# Patient Record
Sex: Male | Born: 1945 | Race: White | Hispanic: No | Marital: Married | Smoking: Former smoker
Health system: Southern US, Community
[De-identification: ages and names within clinical notes are randomized; demographics above are authoritative.]

## PROBLEM LIST (undated history)

## (undated) DIAGNOSIS — J449 Chronic obstructive pulmonary disease, unspecified: Secondary | ICD-10-CM

## (undated) DIAGNOSIS — I7779 Dissection of other artery: Secondary | ICD-10-CM

## (undated) DIAGNOSIS — E119 Type 2 diabetes mellitus without complications: Secondary | ICD-10-CM

## (undated) DIAGNOSIS — H409 Unspecified glaucoma: Secondary | ICD-10-CM

## (undated) DIAGNOSIS — G4700 Insomnia, unspecified: Secondary | ICD-10-CM

## (undated) DIAGNOSIS — M1990 Unspecified osteoarthritis, unspecified site: Secondary | ICD-10-CM

## (undated) DIAGNOSIS — K5790 Diverticulosis of intestine, part unspecified, without perforation or abscess without bleeding: Secondary | ICD-10-CM

## (undated) DIAGNOSIS — R42 Dizziness and giddiness: Secondary | ICD-10-CM

## (undated) DIAGNOSIS — I2510 Atherosclerotic heart disease of native coronary artery without angina pectoris: Secondary | ICD-10-CM

## (undated) DIAGNOSIS — C801 Malignant (primary) neoplasm, unspecified: Secondary | ICD-10-CM

## (undated) DIAGNOSIS — T8859XA Other complications of anesthesia, initial encounter: Secondary | ICD-10-CM

## (undated) DIAGNOSIS — H9190 Unspecified hearing loss, unspecified ear: Secondary | ICD-10-CM

## (undated) DIAGNOSIS — I10 Essential (primary) hypertension: Secondary | ICD-10-CM

## (undated) DIAGNOSIS — H8109 Meniere's disease, unspecified ear: Secondary | ICD-10-CM

## (undated) DIAGNOSIS — D696 Thrombocytopenia, unspecified: Secondary | ICD-10-CM

## (undated) DIAGNOSIS — D126 Benign neoplasm of colon, unspecified: Secondary | ICD-10-CM

## (undated) DIAGNOSIS — E7800 Pure hypercholesterolemia, unspecified: Secondary | ICD-10-CM

## (undated) DIAGNOSIS — Z974 Presence of external hearing-aid: Secondary | ICD-10-CM

## (undated) HISTORY — PX: CORONARY ANGIOPLASTY WITH STENT PLACEMENT: SHX49

## (undated) HISTORY — PX: COLONOSCOPY: SHX174

## (undated) HISTORY — PX: APPENDECTOMY: SHX54

## (undated) HISTORY — PX: ABDOMINAL SURGERY: SHX537

---

## 2004-11-03 ENCOUNTER — Ambulatory Visit: Payer: Self-pay | Admitting: Gastroenterology

## 2005-03-14 ENCOUNTER — Ambulatory Visit: Payer: Self-pay

## 2006-03-13 ENCOUNTER — Ambulatory Visit: Payer: Self-pay

## 2006-12-20 ENCOUNTER — Ambulatory Visit: Payer: Self-pay | Admitting: Gastroenterology

## 2007-01-23 ENCOUNTER — Ambulatory Visit: Payer: Self-pay | Admitting: Internal Medicine

## 2007-06-10 ENCOUNTER — Inpatient Hospital Stay: Payer: Self-pay | Admitting: Internal Medicine

## 2007-06-10 ENCOUNTER — Other Ambulatory Visit: Payer: Self-pay

## 2007-06-11 ENCOUNTER — Other Ambulatory Visit: Payer: Self-pay

## 2007-11-29 ENCOUNTER — Ambulatory Visit: Payer: Self-pay | Admitting: Gastroenterology

## 2009-08-02 ENCOUNTER — Ambulatory Visit: Payer: Self-pay | Admitting: Urology

## 2010-07-28 ENCOUNTER — Ambulatory Visit: Payer: Self-pay

## 2011-06-05 ENCOUNTER — Ambulatory Visit: Payer: Self-pay | Admitting: Internal Medicine

## 2011-06-19 ENCOUNTER — Ambulatory Visit: Payer: Self-pay | Admitting: Internal Medicine

## 2011-06-23 ENCOUNTER — Ambulatory Visit: Payer: Self-pay | Admitting: Internal Medicine

## 2011-07-24 ENCOUNTER — Ambulatory Visit: Payer: Self-pay | Admitting: Internal Medicine

## 2011-07-31 ENCOUNTER — Ambulatory Visit: Payer: Self-pay | Admitting: Internal Medicine

## 2011-08-23 ENCOUNTER — Ambulatory Visit: Payer: Self-pay | Admitting: Internal Medicine

## 2011-10-09 ENCOUNTER — Ambulatory Visit: Payer: Self-pay | Admitting: Internal Medicine

## 2011-10-14 DIAGNOSIS — I2510 Atherosclerotic heart disease of native coronary artery without angina pectoris: Secondary | ICD-10-CM | POA: Insufficient documentation

## 2013-03-13 ENCOUNTER — Ambulatory Visit: Payer: Self-pay | Admitting: Gastroenterology

## 2013-07-24 ENCOUNTER — Inpatient Hospital Stay: Payer: Self-pay | Admitting: Internal Medicine

## 2013-07-24 LAB — BASIC METABOLIC PANEL
Anion Gap: 5 — ABNORMAL LOW (ref 7–16)
BUN: 26 mg/dL — AB (ref 7–18)
CALCIUM: 8.8 mg/dL (ref 8.5–10.1)
Chloride: 98 mmol/L (ref 98–107)
Co2: 27 mmol/L (ref 21–32)
Creatinine: 0.98 mg/dL (ref 0.60–1.30)
EGFR (African American): >60
Glucose: 125 mg/dL — ABNORMAL HIGH (ref 65–99)
Osmolality: 267 (ref 275–301)
Potassium: 4.2 mmol/L (ref 3.5–5.1)
SODIUM: 130 mmol/L — AB (ref 136–145)

## 2013-07-24 LAB — CBC
HCT: 45.3 % (ref 40.0–52.0)
HGB: 15.7 g/dL (ref 13.0–18.0)
MCH: 32.8 pg (ref 26.0–34.0)
MCHC: 34.6 g/dL (ref 32.0–36.0)
MCV: 95 fL (ref 80–100)
Platelet: 150 10*3/uL (ref 150–440)
RBC: 4.78 10*6/uL (ref 4.40–5.90)
RDW: 13.1 % (ref 11.5–14.5)
WBC: 10 10*3/uL (ref 3.8–10.6)

## 2013-07-24 LAB — LIPASE, BLOOD: LIPASE: 191 U/L (ref 73–393)

## 2013-07-24 LAB — TROPONIN I: Troponin-I: <0.02 ng/mL

## 2013-07-25 LAB — TROPONIN I: Troponin-I: <0.02 ng/mL

## 2013-07-25 LAB — CBC WITH DIFFERENTIAL/PLATELET
Basophil #: 0 10*3/uL (ref 0.0–0.1)
Basophil %: 0.2 %
EOS PCT: 0.3 %
Eosinophil #: 0 10*3/uL (ref 0.0–0.7)
HCT: 44.5 % (ref 40.0–52.0)
HGB: 15.1 g/dL (ref 13.0–18.0)
Lymphocyte #: 1.3 10*3/uL (ref 1.0–3.6)
Lymphocyte %: 10 %
MCH: 32.3 pg (ref 26.0–34.0)
MCHC: 34 g/dL (ref 32.0–36.0)
MCV: 95 fL (ref 80–100)
MONO ABS: 0.9 x10 3/mm (ref 0.2–1.0)
Monocyte %: 7.2 %
NEUTROS ABS: 10.8 10*3/uL — AB (ref 1.4–6.5)
Neutrophil %: 82.3 %
Platelet: 155 10*3/uL (ref 150–440)
RBC: 4.68 10*6/uL (ref 4.40–5.90)
RDW: 13 % (ref 11.5–14.5)
WBC: 13.2 10*3/uL — ABNORMAL HIGH (ref 3.8–10.6)

## 2013-07-25 LAB — CK-MB
CK-MB: 1.5 ng/mL (ref 0.5–3.6)
CK-MB: 1.6 ng/mL (ref 0.5–3.6)

## 2013-07-25 LAB — BASIC METABOLIC PANEL
Anion Gap: 4 — ABNORMAL LOW (ref 7–16)
BUN: 25 mg/dL — ABNORMAL HIGH (ref 7–18)
Calcium, Total: 8.6 mg/dL (ref 8.5–10.1)
Chloride: 98 mmol/L (ref 98–107)
Co2: 29 mmol/L (ref 21–32)
Creatinine: 1.02 mg/dL (ref 0.60–1.30)
EGFR (African American): >60
Glucose: 125 mg/dL — ABNORMAL HIGH (ref 65–99)
Osmolality: 269 (ref 275–301)
POTASSIUM: 4.1 mmol/L (ref 3.5–5.1)
SODIUM: 131 mmol/L — AB (ref 136–145)

## 2014-02-16 DIAGNOSIS — Z85828 Personal history of other malignant neoplasm of skin: Secondary | ICD-10-CM | POA: Insufficient documentation

## 2014-03-17 ENCOUNTER — Ambulatory Visit: Payer: Self-pay

## 2014-04-13 ENCOUNTER — Ambulatory Visit: Payer: Self-pay

## 2014-07-18 LAB — CBC
HCT: 41.4 % (ref 40.0–52.0)
HGB: 14.3 g/dL (ref 13.0–18.0)
MCH: 32.3 pg (ref 26.0–34.0)
MCHC: 34.6 g/dL (ref 32.0–36.0)
MCV: 93 fL (ref 80–100)
PLATELETS: 124 10*3/uL — AB (ref 150–440)
RBC: 4.44 10*6/uL (ref 4.40–5.90)
RDW: 12.8 % (ref 11.5–14.5)
WBC: 9.4 10*3/uL (ref 3.8–10.6)

## 2014-07-18 LAB — BASIC METABOLIC PANEL
Anion Gap: 7 (ref 7–16)
BUN: 20 mg/dL
CREATININE: 0.97 mg/dL
Calcium, Total: 8.9 mg/dL
Chloride: 99 mmol/L — ABNORMAL LOW
Co2: 28 mmol/L
EGFR (African American): >60
EGFR (Non-African Amer.): >60
Glucose: 152 mg/dL — ABNORMAL HIGH
POTASSIUM: 3.8 mmol/L
Sodium: 134 mmol/L — ABNORMAL LOW

## 2014-07-18 LAB — TROPONIN I

## 2014-07-18 LAB — LIPASE, BLOOD: Lipase: 54 U/L — ABNORMAL HIGH

## 2014-07-19 ENCOUNTER — Inpatient Hospital Stay
Admit: 2014-07-19 | Disposition: A | Discharge status: Home / Self Care | Payer: Self-pay | Attending: Surgery | Admitting: Surgery

## 2014-07-19 LAB — CBC WITH DIFFERENTIAL/PLATELET
Basophil #: 0 10*3/uL (ref 0.0–0.1)
Basophil %: 0.2 %
EOS ABS: 0 10*3/uL (ref 0.0–0.7)
Eosinophil %: 0.1 %
HCT: 41 % (ref 40.0–52.0)
HGB: 13.8 g/dL (ref 13.0–18.0)
LYMPHS PCT: 8.1 %
Lymphocyte #: 0.8 10*3/uL — ABNORMAL LOW (ref 1.0–3.6)
MCH: 32.1 pg (ref 26.0–34.0)
MCHC: 33.7 g/dL (ref 32.0–36.0)
MCV: 95 fL (ref 80–100)
MONO ABS: 0.6 x10 3/mm (ref 0.2–1.0)
Monocyte %: 5.9 %
NEUTROS PCT: 85.7 %
Neutrophil #: 8.2 10*3/uL — ABNORMAL HIGH (ref 1.4–6.5)
PLATELETS: 112 10*3/uL — AB (ref 150–440)
RBC: 4.31 10*6/uL — ABNORMAL LOW (ref 4.40–5.90)
RDW: 12.6 % (ref 11.5–14.5)
WBC: 9.6 10*3/uL (ref 3.8–10.6)

## 2014-07-19 LAB — BASIC METABOLIC PANEL
Anion Gap: 5 — ABNORMAL LOW (ref 7–16)
BUN: 20 mg/dL
CALCIUM: 9.3 mg/dL
Chloride: 102 mmol/L
Co2: 30 mmol/L
Creatinine: 0.95 mg/dL
EGFR (African American): >60
EGFR (Non-African Amer.): >60
Glucose: 171 mg/dL — ABNORMAL HIGH
POTASSIUM: 4.4 mmol/L
SODIUM: 137 mmol/L

## 2014-08-14 DIAGNOSIS — I10 Essential (primary) hypertension: Secondary | ICD-10-CM | POA: Insufficient documentation

## 2014-08-15 NOTE — H&P (Signed)
PATIENT NAME:  Kirk Russell, Kirk Russell MR#:  [id number] DATE OF BIRTH:  October 07, 1945  DATE OF ADMISSION:  07/24/2013  PRIMARY CARE PHYSICIAN: Dr. Adrian Prows.   REFERRING PHYSICIAN: Dr. Delman Kitten.   CHIEF COMPLAINT: Nausea, vomiting and back pain.   HISTORY OF PRESENT ILLNESS: Kirk Russell is a 69 year old, pleasant, white male with a past medical history of hypertension, diabetes mellitus, osteoarthritis. Presented to the Emergency Department with complaints of nausea and vomiting for the last 4 days. This was also associated with some back pain. The patient states had 2 episodes of vomiting on Sunday. Monday felt better. From Tuesday, the patient has been gradually getting worse and today was unable to keep down any food. The patient did not take his blood pressure medication. The patient also noted to have blood pressure that was trending up in the last 2 days. He has been checking it multiple times with some anxiety. Also complained of back pain in the left paraspinal area, Did not have any diarrhea. Did not have any sick contacts. Today, the patient was noted to have a blood pressure of 165/105. Concerning this, came to the Emergency Department. 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 Otherwise, no other obvious cause for the nausea and vomiting. The patient denies having any fever.   PAST MEDICAL HISTORY:  1. Hypertension.  2. Hyperlipidemia.  3. Diabetes mellitus, diet controlled.  4. Hard of hearing.  5. Meniere's disease.   6. Vertigo.  7. Glaucoma.  8. Diverticulosis.   PAST SURGICAL HISTORY:  surgery as a child, foot surgery.   ALLERGIES: No known drug allergies.   HOME MEDICATIONS:  1. Tramadol 50 mg every 4 hours as needed.  2. Metamucil 1 tablet once a day.  3. Lovastatin 20 mg once a day.  4. Latanoprost 1 drop once a day.  5. Enalapril 2.5 mg  once a day.  6. Dorzolamide/timolol 1 drop 2 times a day.  7. Chondroitin glucosamine 1 tablet once a day.  8. Aspirin 81 mg daily.   SOCIAL HISTORY: Quit smoking at the age of 63. Prior to that, smoked 1 pack a day. Denies drinking alcohol or using illicit drugs. Married, lives with his wife. Usually well functional.   FAMILY HISTORY: Cancers. Mother had breast cancer. Also family history of colon cancer.   REVIEW OF SYSTEMS:  CONSTITUTIONAL: Generalized weakness.  EYES: No change in vision.  ENT: No change in hearing.  RESPIRATORY: No cough, shortness of breath.  CARDIOVASCULAR: No chest pain, palpitations.  GASTROINTESTINAL: Has nausea, vomiting. No diarrhea. No abdominal pain.  GENITOURINARY: No dysuria or hematuria.  HEMATOLOGIC: No easy bruising or bleeding.  SKIN: No rash or lesions.  ENDOCRINE: No polyuria or polydipsia.  MUSCULOSKELETAL: No joint pains and aches.  NEUROLOGIC:  or numbness in any part of the body.   PHYSICAL EXAMINATION:  GENERAL: This is well-built, well-nourished, age-appropriate male lying down in the bed, not in distress.  VITAL SIGNS: Temperature 97.5, pulse 57, blood pressure 167/81, respiratory rate of 18, oxygen saturation 97% on room air.  HEENT: Head normocephalic, atraumatic. There is no scleral icterus. Conjunctivae normal. Pupils equal and react to light. Extraocular movements are intact. Mucous membranes moist. No pharyngeal erythema.  NECK: Supple. No lymphadenopathy. No JVD. No carotid bruit. No thyromegaly.  CHEST: Has no focal tenderness.  LUNGS: Bilaterally clear to auscultation.  HEART: S1, S2 regular. No murmurs  are heard. No pedal edema. Pulses 2+.  ABDOMEN: Bowel sounds present. Soft, nontender, nondistended. No hepatosplenomegaly. On the left paraspinal area, indicated the pain; however, currently denies having any pain.  SKIN: No rash or lesions.  MUSCULOSKELETAL: Good range of motion in all of the extremities.  NEUROLOGIC: The  patient is alert, oriented to place, person and time. Cranial nerves II through XII intact. Motor 5/5 in upper and lower extremities.   LABORATORIES: BMP is completely within normal limits. CBC is completely within normal limits. CT abdomen and pelvis: . 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 Given the mild associated ectasia, this is favored to be subacute or chronic. Otherwise, no additional acute vascular abnormality or other obvious cause for the nausea and vomiting. Colonic diverticulosis without active diverticulitis.   ASSESSMENT AND PLAN: Kirk Russell is a 69 year old male. Comes to the Emergency Department with nausea and vomiting.  1. Nausea and vomiting: There is the possibility that this could be associated with celiac dissection. Will check the lactic acid level. The other possibility is gastritis; however, the patient does not have any focal tenderness. Also, possibility of pancreatitis. Will check the lipase level. Keep the patient n.p.o. Continue with intravenous fluids. Continue with antinausea medication.  2. Hypertension, accelerated: The patient did not take his medications as the patient was nauseated, as well as anxiety further exacerbated the hypertension. Will control the underlying symptoms. Increase the dose of the enalapril to 10 mg daily. If the patient continues to have elevated blood pressure, also start the patient on amlodipine.  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 Abdomen is soft.  4. Back pain: Seems to be more of a musculoskeletal pain. Will continue to follow up.  5. Diabetes mellitus: Diet controlled.  6. Keep the patient on deep vein thrombosis prophylaxis with sequential compression devices.   TIME SPENT: 45 minutes.   ____________________________ Kirk Becton, MD pv:gb D: 07/24/2013 21:13:11 ET T: 07/24/2013 21:40:39 ET JOB#: [id number]  cc: Kirk Becton, MD, <Dictator> Cheral Marker. Ola Spurr,  MD Grier Mitts Nashaun Hillmer MD ELECTRONICALLY SIGNED 08/07/2013 0:26

## 2014-08-15 NOTE — Discharge Summary (Signed)
PATIENT NAME:  Kirk Russell, Kirk Russell MR#:  [id number] DATE OF BIRTH:  17-Sep-1945  DATE OF ADMISSION:  07/24/2013 DATE OF DISCHARGE:  07/26/2013  FINAL DIAGNOSES: 1.  Nausea and vomiting, possibly secondary to celiac artery dissection.  2.  Hypertension.  3.  Diabetes mellitus.  4.  Osteoarthritis.  5.  History of coronary artery disease.   HISTORY AND PHYSICAL: Please see dictated admission history and physical.  SUMMARY COURSE:  The patient was admitted after having episodes of nausea, vomiting. It was unclear whether this was related to recent use of pain medication, but he was incidentally noted to have dissection of the celiac artery. Radiology felt that this might be subacute or chronic, and vascular surgery was consultation. They stated that they were not sure whether this might in fact be more acute and possibly either the cause of or triggered by the recent nausea and vomiting. The treatment for this was conservative, with aspirin, cholesterol medication, and these were continued. The patient's nausea and vomiting completely resolved, bowels were functioning, and he is ambulating well and feels ready to go home.  At this time he will be discharged to home in stable condition on a low-fat, low-cholesterol diet. He is advised to keep his diet bland for the next 2 to 3 days. His physical activity will be up as tolerated. His return to work will be after his followup visit with his physician. He will follow up with Dr. Ola Spurr within the next 1 week. He should follow up with Dr. Lucky Cowboy in the next 3 to 4 weeks to consider further vascular studies.   DISCHARGE MEDICATIONS: 1.  Aspirin 81 mg p.o. daily.  2.  Chondroitin sulfate 1 p.o. daily.  3.  Latanoprost 1 drop to each eye at bedtime.  4.  Lovastatin 20 mg p.o. at bedtime. 5.  Metamucil 1 dose p.o. daily.  6.  Enalapril 2.5 mg p.o. daily.  7.  Dorzolamide timolol 1 drop to each eye 2 times a day.  8.  Ondansetron 4 mg disintegrating tablet  t.i.d. p.r.n. nausea.  9.  Omeprazole 20 mg p.o. b.i.d. x 7 days.  The patient was given instructions to hold tramadol as it is unclear whether this might have caused the nausea.    ____________________________ Adin Hector, MD bjk:ce D: 07/26/2013 11:17:56 ET T: 07/26/2013 16:04:26 ET JOB#: [id number]  cc: Adin Hector, MD, <Dictator> Algernon Huxley, MD Cheral Marker. Ola Spurr, MD Ramonita Lab MD ELECTRONICALLY SIGNED 07/28/2013 12:42

## 2014-08-15 NOTE — Consult Note (Signed)
CHIEF COMPLAINT and HISTORY:  Subjective/Chief Complaint N/V, back pain   History of Present Illness Patient admitted with about a two week history of back pain, intermittent bouts of severe NV, and came in last night with markedly elevated BP.  Has had intermittent bouts of improvement followed by worsening.  Started on steroids for back pain.  Has H/O HTN, DM, and tobacco use but stopped about 20 years ago.   Had a CT scan which I have independently reviewed and he has an isolated celiac dissection without flow limitation or aneurysmal degeneration.  Unable to assess by CT if this is old or new, but given his symptoms the possibility that it is new and causing his N/V are present.   PAST MEDICAL/SURGICAL HISTORY:  Past Medical History:   Diverticulosis:    Hyperlipidemia:    Diabetes (Diet Controlled):    Arthritis:    HOH, Hard of Hearing:    Vertigo:    Glaucoma:    HTN:   ALLERGIES:  Allergies:  Oxycodone: GI Distress  HOME MEDICATIONS:  Home Medications: Medication Instructions Status  aspirin 81 mg enteric coated tablet 1 tab(s) orally once a day  Active  chondroitin-glucosamine with Ascorbic Acid and Minerals oral tablet 1 tab(s) orally once a day Active  latanoprost ophthalmic 0.005% ophthalmic solution 1 drop(s) to each eye once a day (at bedtime) Active  traMADol 50 mg oral tablet 1 tab(s) orally every 4 hours, As Needed - for Pain Active  lovastatin 20 mg oral tablet 1 tab(s) orally once a day (at bedtime) Active  Metamucil 1 dose(s) orally once a day Active  enalapril 2.5 mg oral tablet 1 tab(s) orally once a day Active  dorzolamide-timolol ophthalmic 2%-0.5% preservative-free ophthalmic solution 1 drop(s) to each eye 2 times a day Active   Family and Social History:  Family History Non-Contributory   Social History positive tobacco (Greater than 1 year), negative ETOH, negative Illicit drugs   Place of Living Home   Review of Systems:  Fever/Chills No    Cough No   Sputum No   Abdominal Pain Yes   Diarrhea No   Constipation No   Nausea/Vomiting Yes   SOB/DOE No   Chest Pain No   Telemetry Reviewed NSR   Dysuria No   Tolerating PT Yes   Tolerating Diet No  Nauseated   Medications/Allergies Reviewed Medications/Allergies reviewed   Physical Exam:  GEN well developed, well nourished   HEENT pink conjunctivae, hearing intact to voice   NECK No masses  trachea midline   RESP normal resp effort  no use of accessory muscles   CARD regular rate  no JVD   VASCULAR ACCESS none   ABD denies tenderness  soft  no Adominal Mass   GU no superpubic tenderness   LYMPH negative neck, negative axillae   EXTR negative cyanosis/clubbing, negative edema   SKIN normal to palpation, skin turgor good   NEURO cranial nerves intact, motor/sensory function intact   PSYCH alert, A+O to time, place, person   LABS:  Laboratory Results: Routine Chem:    02-Apr-15 51:70, Basic Metabolic Panel (w/Total Calcium)  Glucose, Serum 125  BUN 26  Creatinine (comp) 0.98  Sodium, Serum 130  Potassium, Serum 4.2  Chloride, Serum 98  CO2, Serum 27  Calcium (Total), Serum 8.8  Anion Gap 5  Osmolality (calc) 267  eGFR (African American) >60  eGFR (Non-African American) >60  eGFR values <79m/min/1.73 m2 may be an indication of chronic  kidney disease (  CKD).  Calculated eGFR is useful in patients with stable renal function.  The eGFR calculation will not be reliable in acutely ill patients  when serum creatinine is changing rapidly. It is not useful in   patients on dialysis. The eGFR calculation may not be applicable  to patients at the low and high extremes of body sizes, pregnant  women, and vegetarians.    02-Apr-15 15:14, Lipase  Lipase 191  Result(s) reported on 24 Jul 2013 at 09:30PM.    03-Apr-15 65:46, Basic Metabolic Panel (w/Total Calcium)  Glucose, Serum 125  BUN 25  Creatinine (comp) 1.02  Sodium, Serum 131   Potassium, Serum 4.1  Chloride, Serum 98  CO2, Serum 29  Calcium (Total), Serum 8.6  Anion Gap 4  Osmolality (calc) 269  eGFR (African American) >60  eGFR (Non-African American) >60  eGFR values <45m/min/1.73 m2 may be an indication of chronic  kidney disease (CKD).  Calculated eGFR is useful in patients with stable renal function.  The eGFR calculation will not be reliable in acutely ill patients  when serum creatinine is changing rapidly. It is not useful in   patients on dialysis. The eGFR calculation may not be applicable  to patients at the low and high extremes of body sizes, pregnant  women, and vegetarians.  Cardiac:    02-Apr-15 15:14, Troponin I  Troponin I < 0.02  0.00-0.05  0.05 ng/mL or less: NEGATIVE   Repeat testing in 3-6 hrs   if clinically indicated.  >0.05 ng/mL: POTENTIAL   MYOCARDIAL INJURY. Repeat   testing in 3-6 hrs if   clinically indicated.  NOTE: An increase or decrease   of 30% or more on serial   testing suggests a   clinically important change    02-Apr-15 23:56, CPK-MB, Serum  CPK-MB, Serum 1.6  Result(s) reported on 25 Jul 2013 at 12:44AM.    02-Apr-15 23:56, Troponin I  Troponin I < 0.02  0.00-0.05  0.05 ng/mL or less: NEGATIVE   Repeat testing in 3-6 hrs   if clinically indicated.  >0.05 ng/mL: POTENTIAL   MYOCARDIAL INJURY. Repeat   testing in 3-6 hrs if   clinically indicated.  NOTE: An increase or decrease   of 30% or more on serial   testing suggests a   clinically important change    03-Apr-15 04:22, CPK-MB, Serum  CPK-MB, Serum 1.5  Result(s) reported on 25 Jul 2013 at 05:05AM.    03-Apr-15 04:22, Troponin I  Troponin I < 0.02  0.00-0.05  0.05 ng/mL or less: NEGATIVE   Repeat testing in 3-6 hrs   if clinically indicated.  >0.05 ng/mL: POTENTIAL   MYOCARDIAL INJURY. Repeat   testing in 3-6 hrs if   clinically indicated.  NOTE: An increase or decrease   of 30% or more on serial   testing suggests a   clinically  important change  Routine Hem:    02-Apr-15 15:14, Hemogram, Platelet Count  WBC (CBC) 10.0  RBC (CBC) 4.78  Hemoglobin (CBC) 15.7  Hematocrit (CBC) 45.3  Platelet Count (CBC) 150  Result(s) reported on 24 Jul 2013 at 03:52PM.  MCV 95  MCH 32.8  MCHC 34.6  RDW 13.1    03-Apr-15 04:22, CBC Profile  WBC (CBC) 13.2  RBC (CBC) 4.68  Hemoglobin (CBC) 15.1  Hematocrit (CBC) 44.5  Platelet Count (CBC) 155  MCV 95  MCH 32.3  MCHC 34.0  RDW 13.0  Neutrophil % 82.3  Lymphocyte % 10.0  Monocyte % 7.2  Eosinophil % 0.3  Basophil % 0.2  Neutrophil # 10.8  Lymphocyte # 1.3  Monocyte # 0.9  Eosinophil # 0.0  Basophil # 0.0  Result(s) reported on 25 Jul 2013 at 05:01AM.   RADIOLOGY:  Radiology Results: LabUnknown:    02-Apr-15 19:43, CT Angiography Chest/Abd/Pelvis w/wo  PACS Image  CT:  CT Angiography Chest/Abd/Pelvis w/wo  REASON FOR EXAM:    low back pain, nausea, weakness, hypertension. eval   for dissection or AAA  COMMENTS:       PROCEDURE: CT  - CT ANGIOGRAPHY CHEST/ABD/PELVIS  - Jul 24 2013  7:43PM     CLINICAL DATA:  Hypertension, nausea, back pain last week. Evaluate  for aortic dissection or aneurysm.    EXAM:  CT ANGIOGRAPHY CHEST, ABDOMEN AND PELVIS    TECHNIQUE:  Multidetector CT imaging through the chest, abdomen and pelvis was  performed using the standard protocol during bolus administration of  intravenous contrast. Multiplanar reconstructed images and MIPs were  obtained and reviewed to evaluate the vascular anatomy.    CONTRAST:  125 mL Isovue 370 administered intravenously    COMPARISON:  None.    FINDINGS:  CTA CHEST FINDINGS    Mediastinum:Unremarkable CT appearance of the thyroid gland. No  suspicious mediastinal or hilar adenopathy. No soft tissue  mediastinal mass. The thoracic esophagus is unremarkable.    Heart/Vascular: No acute intramural hematoma. Conventional 3 vessel  aortic arch with trace atherosclerotic calcifications. No  aneurysmal  dilatation. Dense atherosclerotic calcifications noted throughout  the visualized coronary arteries. The heart is within normal limits  for size. No pericardial effusion.    Lungs/Pleura: Trace dependent atelectasis. Otherwise, the lungs are  clear. No pleural effusion or pneumothorax. No significant chronic  change.    Bones/Soft Tissues: Degenerative changes in both glenohumeral joints  of relatively mild severity. No acute fracture or aggressive  appearing lytic or blastic osseous lesion.    Review of the MIP images confirms the above findings.  CTA ABDOMEN AND PELVIS FINDINGS    VASCULAR    Aorta: Normal caliber thoracic aorta without evidence of dissection,  aneurysmal dilation or other acute pathology. There are a few trace  atherosclerotic vascular calcifications.    Celiac: The origin is widely patent. Beginning approximately 2 cm  from the origin just proximal to the origin of the left gastric  artery there is a focal,non flow limiting dissection was extends to  the bifurcation of the celiac axis into the common hepatic and  splenic arteries. No propagation into either hepatic or splenic  artery. The affected segment of the celiac artery is a mildly  ectatic measuring up to 10 mm in diameter compared to 6 mm in the  more proximal segment. No aneurysmal dilatation. No thrombus or  other irregularity. Conventional hepatic arterial anatomy.    SMA: Widely patent.    Renals: Single dominant renal arteries bilaterally. Mild  atherosclerotic calcification of the origins with minimal narrowing.  No evidence of fibromuscular dysplasia.    IMA: Widely patent.    Inflow: Minimal atherosclerotic vascular calcifications. Widely  patent. No aneurysmal dilatation or dissection.    Proximal Outflow: Widely patent.  Veins: No focal venous abnormality within the limitations of this  non venous phase CTA.    NON-VASCULAR    Abdomen: Unremarkable appearance of the  stomach, duodenum, spleen,  adrenal glands, pancreas and liver. Gallbladder is unremarkable. No  intra or extrahepatic biliary ductal dilatation. Unremarkable  appearance of the bilateral kidneys. No focal  solid lesion,  hydronephrosis or nephrolithiasis. Punctate sub cm low-attenuation  lesion in the rightkidney are too small for accurate  characterization but statistically are likely to represent small  benign cysts.    Mild colonic diverticulosis without evidence of active inflammation  to suggest acute diverticulitis. No focal bowel wall thickeningor  evidence of obstruction. The appendix is not identified and may be  surgically absent. No inflammatory change noted in the right lower  quadrant no free fluid or suspicious adenopathy.    Pelvis: Unremarkable bladder, prostate gland and seminal vesicles.  No free fluid or suspicious adenopathy.    Bones/Soft Tissues: No acute fracture or aggressive appearing lytic  or blastic osseous lesion.    Review of the MIP images confirms the above findings.     IMPRESSION:  CTA CHEST  1. No acute aortic or vascular abnormality.  2. No acute cardiopulmonary process.  3. Multivessel coronary artery disease.  CTA ABD/PELVIS    1. Age indeterminate focal non flow limiting dissection of the  celiac artery. Given the mild associated ectasia, this is favored to  be subacute or chronic.  2. Otherwise, no additional acute vascular abnormality.  3. Mild atherosclerotic vascular calcifications without significant  stenosis.  4. Colonic diverticulosis without evidence of active diverticulitis.  New    Electronically Signed    By: Jacqulynn Cadet M.D.    On: 07/24/2013 19:58         Verified By: Criselda Peaches, M.D.,   ASSESSMENT AND PLAN:  Assessment/Admission Diagnosis celiac dissection   Plan Had a CT scan which I have independently reviewed and he has an isolated celiac dissection without flow limitation or aneurysmal  degeneration.  Unable to assess by CT if this is old or new, but given his symptoms the possibility that it is new and causing his N/V are present.   Cause could have been steroids with progressed HTN and wretching from NV.  This could be old, but either way, no intervention would be recommended at current without flow limitation or aneurysmal degeneration.  Would control BP and keep below 438 systolic if possible.  Avoid wretching with antiemetics as needed.  Can give diet a trial and see how he does.  Will see in office in 3-4 weeks in follow up with mesenteric duplex.     level 4   Electronic Signatures: Algernon Huxley (MD)  (Signed 03-Apr-15 13:21)  Authored: Chief Complaint and History, PAST MEDICAL/SURGICAL HISTORY, ALLERGIES, HOME MEDICATIONS, Family and Social History, Review of Systems, Physical Exam, LABS, RADIOLOGY, Assessment and Plan   Last Updated: 03-Apr-15 13:21 by Algernon Huxley (MD)

## 2014-08-23 NOTE — H&P (Signed)
PATIENT NAME:  Kirk Russell, Kirk Russell MR#:  [id number] DATE OF BIRTH:  1946/04/14  DATE OF ADMISSION:  07/19/2014  CHIEF COMPLAINT: Abdominal pain.   HISTORY OF PRESENT ILLNESS: This patient with the acute onset of abdominal pain earlier this afternoon on the 26th. It came on all of a sudden. He has never had an episode like this before. He had considerable nausea and dry heaves and a single large-volume emesis. He has not passed any gas but did have a normal bowel movement earlier this morning. Denies hematochezia, melena, or hematemesis. He states his pain has completely resolved at this point, but he remains considerably nauseated and diaphoretic.   A workup in the Emergency Room suggests an ileus versus small-bowel obstruction. No one else in his family is ill and he denies diarrhea.   PAST MEDICAL HISTORY: Coronary artery disease with stents. The stents were placed in 2013. Coated stents were placed. He took Plavix for 1 year and is now off of Plavix but on aspirin. Denies chest pain. Did not have an MI. He does have hypertension and glaucoma and hyperlipidemia.   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 He has a long paramedian incision. No other abdominal surgeries.   ALLERGIES: OXYCODONE.   MEDICATIONS: Multiple, see reconciliation.   FAMILY HISTORY: No history of colon cancer.   SOCIAL HISTORY: The patient does not smoke or drink. He is a retired Airline pilot.   ADDITIONAL HISTORY: He had a normal colonoscopy last year.   REVIEW OF SYSTEMS: A complete system review was performed and negative with the exception of that mentioned in the HPI.   PHYSICAL EXAMINATION:  GENERAL: Uncomfortable-appearing diaphoretic male patient.  VITAL SIGNS: Temperature of 97.7, pulse of 55, respirations 18, blood pressure 129/86. Pain scale was as high as 10. It is now completely resolved.  HEENT: Shows no scleral  icterus. He is diaphoretic.  NECK: No palpable neck nodes.  CHEST: Clear to auscultation.  CARDIAC: Regular rate and rhythm.  ABDOMEN: Distended. There is a long right-sided paramedian incision without hernias.  His abdomen is distended and tympanitic but nontender. No guarding, no rebound. No percussion tenderness.  EXTREMITIES: Without edema. Calves are nontender.  NEUROLOGIC: Grossly intact.  INTEGUMENT: Shows no jaundice.   IMAGING: CT scans and abdominal films are personally reviewed, suggestive of an ileus versus small bowel obstruction. He has a very large dilated stomach as well.   White blood cell count is normal. Electrolytes are within normal limits. There is no sign of acidosis. Chloride of 99, sodium of 134.   ASSESSMENT AND PLAN: This is a patient with acute onset of abdominal pain. He has never had an episode like this before. The abdominal pain has resolved, but he remains nauseated and has had multiple dry heaves and a single large-volume emesis. His stomach is dilated on KUB and CT scan, which has been personally reviewed. I have recommended admission to the hospital, nasogastric intubation and IV hydration with pain and nausea control and re-examination. Films will be ordered for later on today as well as labs. This was discussed with the patient, his wife and Dr. Karma Greaser in the emergency room.    ____________________________ Jerrol Banana Burt Knack, MD rec:ST D: 07/19/2014 00:48:48 ET T: 07/19/2014 01:48:36 ET JOB#: [id number]  cc: Jerrol Banana. Burt Knack, MD, <Dictator> Florene Glen MD ELECTRONICALLY SIGNED 07/19/2014 6:56

## 2014-08-23 NOTE — H&P (Signed)
Subjective/Chief Complaint abd pain   History of Present Illness acute onset pain, periumbilical pain is now completely resolved, remains nauseated with dry heaves started this afternoon, no prior episode  no hemetemesis, no flatus, nml BM early am no f/c nml colonoscopy last year   Past History PMH HTN, CAD, stents (coated, off plavix) PSH unknown intestinal surgery as baby   Past Medical Health Coronary Artery Disease, Hypertension   Past Med/Surgical Hx:  Diverticulosis:   Hyperlipidemia:   Diabetes (Diet Controlled):   Arthritis:   HOH, Hard of Hearing:   Vertigo:   Glaucoma:   HTN:   ALLERGIES:  Oxycodone: GI Distress  Family and Social History:  Family History Non-Contributory   Social History negative tobacco, negative ETOH, retired Sales executive of [employer]:  Fever/Chills No   Cough No   Abdominal Pain Yes  resolved   Diarrhea No   Constipation No   Nausea/Vomiting Yes   SOB/DOE No   Chest Pain No   Dysuria No   Tolerating Diet No  Nauseated  Vomiting   Medications/Allergies Reviewed Medications/Allergies reviewed   Physical Exam:  GEN uncomfortable, diaphoretic   HEENT pink conjunctivae   NECK supple   CARD regular rate  irregular rate   ABD denies tenderness  distended  rt paramedian scar, nontender, distended, tympanitic   LYMPH negative neck   EXTR negative edema   SKIN normal to palpation, No rashes   PSYCH alert, A+O to time, place, person, good insight   Lab Results: Routine Chem:  26-Mar-16 20:04   Glucose, Serum  152 (65-99 NOTE: New Reference Range  06/30/14)  BUN 20 (6-20 NOTE: New Reference Range  06/30/14)  Creatinine (comp) 0.97 (0.61-1.24 NOTE: New Reference Range  06/30/14)  Sodium, Serum  134 (135-145 NOTE: New Reference Range  06/30/14)  Potassium, Serum 3.8 (3.5-5.1 NOTE: New Reference Range  06/30/14)  Chloride, Serum  99 (101-111 NOTE: New Reference Range  06/30/14)  CO2, Serum 28 (22-32 NOTE: New Reference Range  06/30/14)  Calcium (Total), Serum 8.9 (8.9-10.3 NOTE: New Reference Range  06/30/14)  Anion Gap 7  eGFR (African American) >60  eGFR (Non-African American) >60 (eGFR values <22m/min/1.73 m2 may be an indication of chronic kidney disease (CKD). Calculated eGFR is useful in patients with stable renal function. The eGFR calculation will not be reliable in acutely ill patients when serum creatinine is changing rapidly. It is not useful in patients on dialysis. The eGFR calculation may not be applicable to patients at the low and high extremes of body sizes, pregnant women, and vegetarians.)  Lipase  54 (22-51 NOTE: New Reference Range  06/30/14)  Cardiac:  26-Mar-16 20:04   Troponin I <0.03 (0.00-0.03 0.03 ng/mL or less: NEGATIVE  Repeat testing in 3-6 hrs  if clinically indicated. >0.05 ng/mL: POTENTIAL  MYOCARDIAL INJURY. Repeat  testing in 3-6 hrs if  clinically indicated. NOTE: An increase or decrease  of 30% or more on serial  testing suggests a  clinically important change NOTE: New Reference Range  06/30/14)  Routine Hem:  26-Mar-16 20:04   WBC (CBC) 9.4  RBC (CBC) 4.44  Hemoglobin (CBC) 14.3  Hematocrit (CBC) 41.4  Platelet Count (CBC)  124 (Result(s) reported on 18 Jul 2014 at 08:35PM.)  MCV 93  MCH 32.3  MCHC 34.6  RDW 12.8   Radiology Results: XRay:    26-Mar-16 22:01, Abdomen Flat and Erect  Abdomen Flat and Erect  REASON FOR EXAM:  abd pain, distention, guarding  COMMENTS:       PROCEDURE: DXR - DXR ABDOMEN 2 V FLAT AND ERECT  - Jul 18 2014 10:01PM     CLINICAL DATA:  Central abdominal pain for 7 hours. Initially  left-sided pain, now pain on the right.    EXAM:  ABDOMEN - 2 VIEW    COMPARISON:  CT 07/24/2013    FINDINGS:  Mild gaseous distention of stomach with air-fluid level. A few  dilated air-filled small bowel loops in the left lower abdomen  measuring 3.9 cm with air-fluid  levels. No free air. Small volume of  stool throughout the colon. No acute osseous abnormalities.     IMPRESSION:  A few dilated small bowel loops with air-fluid levels and gaseous  gastric distention. Early small bowel obstruction versus ileus.      Electronically Signed  By: Jeb Levering M.D.    On: 07/18/2014 22:29         Verified By: Rollene Fare. Marisue Humble, M.D.,  CT:    26-Mar-16 22:58, CT Abdomen and Pelvis With Contrast  CT Abdomen and Pelvis With Contrast  REASON FOR EXAM:    (1) diffuse abd pain, tenderness, gaurding,   distended, air fluid levels on xray;  COMMENTS:       PROCEDURE: CT  - CT ABDOMEN / PELVIS  W  - Jul 18 2014 10:58PM     CLINICAL DATA:  Acute onset diffuse abdominal pain. Nausea,  vomiting.    EXAM:  CT ABDOMEN AND PELVIS WITH CONTRAST    TECHNIQUE:  Multidetector CT imaging of the abdomen and pelvis was performed  using the standard protocol following bolus administration of  intravenous contrast.    CONTRAST:  100 cc Omnipaque 300 IV    COMPARISON:  CT 08/15/2013.  Plain films earlier today.    FINDINGS:  Left basilar scarring or atelectasis in the lingula. Lung bases  otherwise clear. Heart is normal size. No effusions.    Liver, gallbladder, spleen, pancreas, adrenals andkidneys are  normal.    Dilated proximal and mid small bowel loops. Distal small bowel loops  are decompressed. Findings compatible with small bowel obstruction.  Transition point is seen in the anterior mid abdomen on image 57  without obvious cause, presumably adhesions.    Aorta is normal caliber. No free fluid, free air or adenopathy.  Large bowel unremarkable except for scattered left colonic  diverticula. No active diverticulitis.    No acute bony abnormality or focal bone lesion.     IMPRESSION:  Partial small bowel obstruction with transition point in the mid  small bowel in the anterior lower abdomen.      Electronically Signed    By: Rolm Baptise  M.D.    On: 07/18/2014 23:20         Verified By: Raelyn Number, M.D.,    Assessment/Admission Diagnosis SBO vs viral gastroent rec NG and hydrate reexamine   Electronic Signatures: Florene Glen (MD)  (Signed 27-Mar-16 00:44)  Authored: CHIEF COMPLAINT and HISTORY, PAST MEDICAL/SURGIAL HISTORY, ALLERGIES, FAMILY AND SOCIAL HISTORY, REVIEW OF SYSTEMS, PHYSICAL EXAM, LABS, Radiology, ASSESSMENT AND PLAN   Last Updated: 27-Mar-16 00:44 by Florene Glen (MD)

## 2014-08-23 NOTE — Discharge Summary (Signed)
PATIENT NAME:  Kirk Russell, Kirk Russell MR#:  [id number] DATE OF BIRTH:  November 20, 1945  DATE OF ADMISSION:  07/19/2014 DATE OF DISCHARGE:  07/22/2014  DIAGNOSES:  1.  Partial small bowel obstruction, resolved.  2.  Coronary artery disease with stents. 3.  Hypertension.  4.  Glaucoma.  5.  Hyperlipidemia.   PROCEDURES: None.   CONSULTANTS: None.   HISTORY OF PRESENT ILLNESS AND HOSPITAL COURSE: This is a patient who was admitted to the hospital from the Emergency Room. He had sudden onset of abdominal pain somewhat to the left of midline, but had had a normal bowel movement earlier in the morning. He was diaphoretic and was having some nausea and vomiting. He was seen in the Emergency Room and admitted to the hospital with a tentative diagnosis of partial small bowel obstruction. A nasogastric tube had been placed and he had been volume resuscitated. In the next few days he started passing gas, having bowel movements.  The nasogastric tube was removed and his diet was started first as clears, then advanced to regular. He was tolerating a regular diet and having normal bowel movements. He will follow up with his primary care physician. No new medications were added at this hospitalization.     ____________________________ Jerrol Banana Burt Knack, MD rec:bu D: 07/30/2014 18:24:01 ET T: 07/30/2014 19:57:11 ET JOB#: [id number]  cc: Jerrol Banana. Burt Knack, MD, <Dictator> Florene Glen MD ELECTRONICALLY SIGNED 07/31/2014 9:25

## 2014-12-27 ENCOUNTER — Encounter: Payer: Self-pay | Admitting: *Deleted

## 2014-12-27 DIAGNOSIS — Z87891 Personal history of nicotine dependence: Secondary | ICD-10-CM | POA: Diagnosis not present

## 2014-12-27 DIAGNOSIS — R42 Dizziness and giddiness: Secondary | ICD-10-CM | POA: Diagnosis not present

## 2014-12-27 DIAGNOSIS — I10 Essential (primary) hypertension: Secondary | ICD-10-CM | POA: Insufficient documentation

## 2014-12-27 DIAGNOSIS — E119 Type 2 diabetes mellitus without complications: Secondary | ICD-10-CM | POA: Diagnosis not present

## 2014-12-27 NOTE — ED Notes (Signed)
Pt presents w/ c/o HTN, denies associated sxs of headache, dizziness, n/v, chest pain. Pt states routine b/p check at home and his b/p was elevated slightly above normal but SBP never reached 180. Pt has medication for HTN and is taking it as prescribed.

## 2014-12-28 ENCOUNTER — Emergency Department
Admission: EM | Admit: 2014-12-28 | Discharge: 2014-12-28 | Disposition: A | Discharge status: Home / Self Care | Payer: Medicare Other | Attending: Emergency Medicine | Admitting: Emergency Medicine

## 2014-12-28 DIAGNOSIS — I10 Essential (primary) hypertension: Secondary | ICD-10-CM

## 2014-12-28 HISTORY — DX: Type 2 diabetes mellitus without complications: E11.9

## 2014-12-28 HISTORY — DX: Unspecified osteoarthritis, unspecified site: M19.90

## 2014-12-28 HISTORY — DX: Essential (primary) hypertension: I10

## 2014-12-28 HISTORY — DX: Atherosclerotic heart disease of native coronary artery without angina pectoris: I25.10

## 2014-12-28 HISTORY — DX: Pure hypercholesterolemia, unspecified: E78.00

## 2014-12-28 NOTE — Discharge Instructions (Signed)
Hypertension °Hypertension, commonly called high blood pressure, is when the force of blood pumping through your arteries is too strong. Your arteries are the blood vessels that carry blood from your heart throughout your body. A blood pressure reading consists of a higher number over a lower number, such as 110/72. The higher number (systolic) is the pressure inside your arteries when your heart pumps. The lower number (diastolic) is the pressure inside your arteries when your heart relaxes. Ideally you want your blood pressure below 120/80. °Hypertension forces your heart to work harder to pump blood. Your arteries may become narrow or stiff. Having hypertension puts you at risk for heart disease, stroke, and other problems.  °RISK FACTORS °Some risk factors for high blood pressure are controllable. Others are not.  °Risk factors you cannot control include:  °· Race. You may be at higher risk if you are African American. °· Age. Risk increases with age. °· Gender. Men are at higher risk than women before age 45 years. After age 65, women are at higher risk than men. °Risk factors you can control include: °· Not getting enough exercise or physical activity. °· Being overweight. °· Getting too much fat, sugar, calories, or salt in your diet. °· Drinking too much alcohol. °SIGNS AND SYMPTOMS °Hypertension does not usually cause signs or symptoms. Extremely high blood pressure (hypertensive crisis) may cause headache, anxiety, shortness of breath, and nosebleed. °DIAGNOSIS  °To check if you have hypertension, your health care provider will measure your blood pressure while you are seated, with your arm held at the level of your heart. It should be measured at least twice using the same arm. Certain conditions can cause a difference in blood pressure between your right and left arms. A blood pressure reading that is higher than normal on one occasion does not mean that you need treatment. If one blood pressure reading  is high, ask your health care provider about having it checked again. °TREATMENT  °Treating high blood pressure includes making lifestyle changes and possibly taking medicine. Living a healthy lifestyle can help lower high blood pressure. You may need to change some of your habits. °Lifestyle changes may include: °· Following the DASH diet. This diet is high in fruits, vegetables, and whole grains. It is low in salt, red meat, and added sugars. °· Getting at least 2½ hours of brisk physical activity every week. °· Losing weight if necessary. °· Not smoking. °· Limiting alcoholic beverages. °· Learning ways to reduce stress. ° If lifestyle changes are not enough to get your blood pressure under control, your health care provider may prescribe medicine. You may need to take more than one. Work closely with your health care provider to understand the risks and benefits. °HOME CARE INSTRUCTIONS °· Have your blood pressure rechecked as directed by your health care provider.   °· Take medicines only as directed by your health care provider. Follow the directions carefully. Blood pressure medicines must be taken as prescribed. The medicine does not work as well when you skip doses. Skipping doses also puts you at risk for problems.   °· Do not smoke.   °· Monitor your blood pressure at home as directed by your health care provider.  °SEEK MEDICAL CARE IF:  °· You think you are having a reaction to medicines taken. °· You have recurrent headaches or feel dizzy. °· You have swelling in your ankles. °· You have trouble with your vision. °SEEK IMMEDIATE MEDICAL CARE IF: °· You develop a severe headache or confusion. °·   You have unusual weakness, numbness, or feel faint. °· You have severe chest or abdominal pain. °· You vomit repeatedly. °· You have trouble breathing. °MAKE SURE YOU:  °· Understand these instructions. °· Will watch your condition. °· Will get help right away if you are not doing well or get worse. °Document  Released: 04/10/2005 Document Revised: 08/25/2013 Document Reviewed: 01/31/2013 °ExitCare® Patient Information ©2015 [employer]. This information is not intended to replace advice given to you by your health care provider. Make sure you discuss any questions you have with your health care provider. ° °DASH Eating Plan °DASH stands for "Dietary Approaches to Stop Hypertension." The DASH eating plan is a healthy eating plan that has been shown to reduce high blood pressure (hypertension). Additional health benefits may include reducing the risk of type 2 diabetes mellitus, heart disease, and stroke. The DASH eating plan may also help with weight loss. °WHAT DO I NEED TO KNOW ABOUT THE DASH EATING PLAN? °For the DASH eating plan, you will follow these general guidelines: °· Choose foods with a percent daily value for sodium of less than 5% (as listed on the food label). °· Use salt-free seasonings or herbs instead of table salt or sea salt. °· Check with your health care provider or pharmacist before using salt substitutes. °· Eat lower-sodium products, often labeled as "lower sodium" or "no salt added." °· Eat fresh foods. °· Eat more vegetables, fruits, and low-fat dairy products. °· Choose whole grains. Look for the word "whole" as the first word in the ingredient list. °· Choose fish and skinless chicken or turkey more often than red meat. Limit fish, poultry, and meat to 6 oz (170 g) each day. °· Limit sweets, desserts, sugars, and sugary drinks. °· Choose heart-healthy fats. °· Limit cheese to 1 oz (28 g) per day. °· Eat more home-cooked food and less restaurant, buffet, and fast food. °· Limit fried foods. °· Cook foods using methods other than frying. °· Limit canned vegetables. If you do use them, rinse them well to decrease the sodium. °· When eating at a restaurant, ask that your food be prepared with less salt, or no salt if possible. °WHAT FOODS CAN I EAT? °Seek help from a dietitian for individual  calorie needs. °Grains °Whole grain or whole wheat bread. Brown rice. Whole grain or whole wheat pasta. Quinoa, bulgur, and whole grain cereals. Low-sodium cereals. Corn or whole wheat flour tortillas. Whole grain cornbread. Whole grain crackers. Low-sodium crackers. °Vegetables °Fresh or frozen vegetables (raw, steamed, roasted, or grilled). Low-sodium or reduced-sodium tomato and vegetable juices. Low-sodium or reduced-sodium tomato sauce and paste. Low-sodium or reduced-sodium canned vegetables.  °Fruits °All fresh, canned (in natural juice), or frozen fruits. °Meat and Other Protein Products °Ground beef (85% or leaner), grass-fed beef, or beef trimmed of fat. Skinless chicken or turkey. Ground chicken or turkey. Pork trimmed of fat. All fish and seafood. Eggs. Dried beans, peas, or lentils. Unsalted nuts and seeds. Unsalted canned beans. °Dairy °Low-fat dairy products, such as skim or 1% milk, 2% or reduced-fat cheeses, low-fat ricotta or cottage cheese, or plain low-fat yogurt. Low-sodium or reduced-sodium cheeses. °Fats and Oils °Tub margarines without trans fats. Light or reduced-fat mayonnaise and salad dressings (reduced sodium). Avocado. Safflower, olive, or canola oils. Natural peanut or almond butter. °Other °Unsalted popcorn and pretzels. °The items listed above may not be a complete list of recommended foods or beverages. Contact your dietitian for more options. °WHAT FOODS ARE NOT RECOMMENDED? °Grains °White   bread. White pasta. White rice. Refined cornbread. Bagels and croissants. Crackers that contain trans fat. °Vegetables °Creamed or fried vegetables. Vegetables in a cheese sauce. Regular canned vegetables. Regular canned tomato sauce and paste. Regular tomato and vegetable juices. °Fruits °Dried fruits. Canned fruit in light or heavy syrup. Fruit juice. °Meat and Other Protein Products °Fatty cuts of meat. Ribs, chicken wings, bacon, sausage, bologna, salami, chitterlings, fatback, hot dogs,  bratwurst, and packaged luncheon meats. Salted nuts and seeds. Canned beans with salt. °Dairy °Whole or 2% milk, cream, half-and-half, and cream cheese. Whole-fat or sweetened yogurt. Full-fat cheeses or blue cheese. Nondairy creamers and whipped toppings. Processed cheese, cheese spreads, or cheese curds. °Condiments °Onion and garlic salt, seasoned salt, table salt, and sea salt. Canned and packaged gravies. Worcestershire sauce. Tartar sauce. Barbecue sauce. Teriyaki sauce. Soy sauce, including reduced sodium. Steak sauce. Fish sauce. Oyster sauce. Cocktail sauce. Horseradish. Ketchup and mustard. Meat flavorings and tenderizers. Bouillon cubes. Hot sauce. Tabasco sauce. Marinades. Taco seasonings. Relishes. °Fats and Oils °Butter, stick margarine, lard, shortening, ghee, and bacon fat. Coconut, palm kernel, or palm oils. Regular salad dressings. °Other °Pickles and olives. Salted popcorn and pretzels. °The items listed above may not be a complete list of foods and beverages to avoid. Contact your dietitian for more information. °WHERE CAN I FIND MORE INFORMATION? °National [employer], [employer], [employer]: www.nhlbi.nih.gov/health/health-topics/topics/dash/ °Document Released: 03/30/2011 Document Revised: 08/25/2013 Document Reviewed: 02/12/2013 °ExitCare® Patient Information ©2015 [employer]. This information is not intended to replace advice given to you by your health care provider. Make sure you discuss any questions you have with your health care provider. ° °How to Take Your Blood Pressure °HOW DO I GET A BLOOD PRESSURE MACHINE? °· You can buy an electronic home blood pressure machine at your local pharmacy. Insurance will sometimes cover the cost if you have a prescription. °· Ask your doctor what type of machine is best for you. There are different machines for your arm and your wrist. °· If you decide to buy a machine to check your blood pressure on your arm, first check the size of your arm so you  can buy the right size cuff. To check the size of your arm:   °¨ Use a measuring tape that shows both inches and centimeters.   °¨ Wrap the measuring tape around the upper-middle part of your arm. You may need someone to help you measure.   °¨ Write down your arm measurement in both inches and centimeters.   °· To measure your blood pressure correctly, it is important to have the right size cuff.   °¨ If your arm is up to 13 inches (up to 34 centimeters), get an adult cuff size. °¨ If your arm is 13 to 17 inches (35 to 44 centimeters), get a large adult cuff size.   °¨  If your arm is 17 to 20 inches (45 to 52 centimeters), get an adult thigh cuff.   °WHAT DO THE NUMBERS MEAN?  °· There are two numbers that make up your blood pressure. For example: 120/80. °¨ The first number (120 in our example) is called the "systolic pressure." It is a measure of the pressure in your blood vessels when your heart is pumping blood. °¨ The second number (80 in our example) is called the "diastolic pressure." It is a measure of the pressure in your blood vessels when your heart is resting between beats. °· Your doctor will tell you what your blood pressure should be. °WHAT SHOULD I DO BEFORE I   CHECK MY BLOOD PRESSURE?  °· Try to rest or relax for at least 30 minutes before you check your blood pressure. °· Do not smoke. °· Do not have any drinks with caffeine, such as: °¨ Soda. °¨ Coffee. °¨ Tea. °· Check your blood pressure in a quiet room. °· Sit down and stretch out your arm on a table. Keep your arm at about the level of your heart. Let your arm relax. °· Make sure that your legs are not crossed. °HOW DO I CHECK MY BLOOD PRESSURE? °· Follow the directions that came with your machine. °· Make sure you remove any tight-fighting clothing from your arm or wrist. Wrap the cuff around your upper arm or wrist. You should be able to fit a finger between the cuff and your arm. If you cannot fit a finger between the cuff and your arm, it  is too tight and should be removed and rewrapped. °· Some units require you to manually pump up the arm cuff. °· Automatic units inflate the cuff when you press a button. °· Cuff deflation is automatic in both models. °· After the cuff is inflated, the unit measures your blood pressure and pulse. The readings are shown on a monitor. Hold still and breathe normally while the cuff is inflated. °· Getting a reading takes less than a minute. °· Some models store readings in a memory. Some provide a printout of readings. If your machine does not store your readings, keep a written record. °· Take readings with you to your next visit with your doctor. °Document Released: 03/23/2008 Document Revised: 08/25/2013 Document Reviewed: 06/05/2013 °ExitCare® Patient Information ©2015 [employer]. This information is not intended to replace advice given to you by your health care provider. Make sure you discuss any questions you have with your health care provider. ° °

## 2014-12-28 NOTE — ED Notes (Addendum)
Patient reports checking BP at home earlier and finding his BP to be 180s/100s. Patient was concerned. Patient reports he is taking a prednisone taper for sciatica. Patient denies blurred vision, headaches and dizziness.

## 2014-12-28 NOTE — ED Provider Notes (Signed)
[facility] Emergency Department Provider Note  ____________________________________________  Time seen: Approximately 0045 AM  I have reviewed the triage vital signs and the nursing notes.   HISTORY  Chief Complaint Hypertension    HPI Kirk Russell is a 69 y.o. male who comes in today with elevated blood pressure. The patient reports his blood pressure has been up all day. He reports that his blood pressure was 150/110. The patient reports that he's recently come off of steroids so he is unsure if that may be causing his high blood pressure. The patient reports that he took his blood pressure medicine this morning without any difficulty. He reports that when he was making his lunch he had a moment of slight dizziness but reports that it passed immediately and he was able to eat his lunch without any difficulty. The patient checks his blood pressure after lunch and it was found to be 147/92. He reports that he has felt fine the rest of the day but every time he checks his blood pressure is still higher than what is normal for him. The patient reports that he feels fine now and has never had any chest pain blurry vision headache or shortness of breath. The patient was concerned so he decided to come in for further evaluation.   Past Medical History  Diagnosis Date  . Hypertension   . Hypercholesteremia   . Diabetes mellitus without complication   . Coronary artery disease   . Arthritis     There are no active problems to display for this patient.   Past Surgical History  Procedure Laterality Date  . Coronary angioplasty with stent placement    . Abdominal surgery      No current outpatient prescriptions on file.  Allergies Review of patient's allergies indicates no known allergies.  History reviewed. No pertinent family history.  Social History Social History  Substance Use Topics  . Smoking status: Former Research scientist (life sciences)  . Smokeless tobacco:  Former Systems developer  . Alcohol Use: 4.8 oz/week    8 Cans of beer per week    Review of Systems Constitutional: No fever/chills Eyes: No visual changes. ENT: No sore throat. Cardiovascular: Denies chest pain. Respiratory: Denies shortness of breath. Gastrointestinal: No abdominal pain.  No nausea, no vomiting.  No diarrhea.  No constipation. Genitourinary: Negative for dysuria. Musculoskeletal: Negative for back pain. Skin: Negative for rash. Neurological: Dizziness  10-point ROS otherwise negative.  ____________________________________________   PHYSICAL EXAM:  VITAL SIGNS: ED Triage Vitals  Enc Vitals Group     BP 12/27/14 2202 154/86 mmHg     Pulse Rate 12/27/14 2202 81     Resp 12/27/14 2202 18     Temp 12/27/14 2202 98.1 F (36.7 C)     Temp Source 12/27/14 2202 Oral     SpO2 12/27/14 2202 95 %     Weight 12/27/14 2202 210 lb (95.255 kg)     Height 12/27/14 2202 5\' 9"  (1.753 m)     Head Cir --      Peak Flow --      Pain Score --      Pain Loc --      Pain Edu? --      Excl. in [place]? --     Constitutional: Alert and oriented. Well appearing and in no acute distress. Eyes: Conjunctivae are normal. PERRL. EOMI. Head: Atraumatic. Nose: No congestion/rhinnorhea. Mouth/Throat: Mucous membranes are moist.  Oropharynx non-erythematous. Cardiovascular: Normal rate, regular rhythm. Grossly normal  heart sounds.  Good peripheral circulation. Respiratory: Normal respiratory effort.  No retractions. Lungs CTAB. Gastrointestinal: Soft and nontender. No distention. Positive bowel sounds Musculoskeletal: No lower extremity tenderness nor edema.   Neurologic:  Normal speech and language.  Skin:  Skin is warm, dry and intact.  Psychiatric: Mood and affect are normal.   ____________________________________________   LABS (all labs ordered are listed, but only abnormal results are displayed)  Labs Reviewed - No data to  display ____________________________________________  EKG  None ____________________________________________  RADIOLOGY  None ____________________________________________   PROCEDURES  Procedure(s) performed: None  Critical Care performed: No  ____________________________________________   INITIAL IMPRESSION / ASSESSMENT AND PLAN / ED COURSE  Pertinent labs & imaging results that were available during my care of the patient were reviewed by me and considered in my medical decision making (see chart for details).  This is a 69 year old male who comes in today with elevated blood pressure during the day. The patient did not have any chest pain or shortness of breath and the blurry vision. He reports he had an episode of dizziness right before he ate lunch. The patient reports that he has felt well all day. I explained that BP can be affected by diet and stress. The patient's wife does report that he has not eaten the best and has been very anxious about his blood pressure today. The patient typically checks his blood pressure 1-2 times per week. I encouraged him to drink more water and follow up with his PCP as he may need and increase in his blood pressure meds. As the patient does not have any symptoms i did not check any blood work. The patient will be discharged to follow up with his primary care physician. ____________________________________________   FINAL CLINICAL IMPRESSION(S) / ED DIAGNOSES  Final diagnoses:  Essential hypertension      Loney Hering, MD 12/28/14 [phone]

## 2015-02-24 DIAGNOSIS — E782 Mixed hyperlipidemia: Secondary | ICD-10-CM | POA: Insufficient documentation

## 2015-10-01 ENCOUNTER — Ambulatory Visit
Admission: EM | Admit: 2015-10-01 | Discharge: 2015-10-01 | Disposition: A | Discharge status: Home / Self Care | Payer: Medicare Other | Attending: Family Medicine | Admitting: Family Medicine

## 2015-10-01 DIAGNOSIS — M1990 Unspecified osteoarthritis, unspecified site: Secondary | ICD-10-CM | POA: Diagnosis not present

## 2015-10-01 DIAGNOSIS — I2510 Atherosclerotic heart disease of native coronary artery without angina pectoris: Secondary | ICD-10-CM | POA: Diagnosis not present

## 2015-10-01 DIAGNOSIS — Z87891 Personal history of nicotine dependence: Secondary | ICD-10-CM | POA: Diagnosis not present

## 2015-10-01 DIAGNOSIS — W57XXXA Bitten or stung by nonvenomous insect and other nonvenomous arthropods, initial encounter: Secondary | ICD-10-CM | POA: Insufficient documentation

## 2015-10-01 DIAGNOSIS — S70361A Insect bite (nonvenomous), right thigh, initial encounter: Secondary | ICD-10-CM | POA: Insufficient documentation

## 2015-10-01 DIAGNOSIS — E7800 Pure hypercholesterolemia, unspecified: Secondary | ICD-10-CM | POA: Diagnosis not present

## 2015-10-01 DIAGNOSIS — E119 Type 2 diabetes mellitus without complications: Secondary | ICD-10-CM | POA: Diagnosis not present

## 2015-10-01 DIAGNOSIS — S80861A Insect bite (nonvenomous), right lower leg, initial encounter: Secondary | ICD-10-CM

## 2015-10-01 DIAGNOSIS — I10 Essential (primary) hypertension: Secondary | ICD-10-CM | POA: Diagnosis not present

## 2015-10-01 MED ORDER — MUPIROCIN 2 % EX OINT: TOPICAL_OINTMENT | CUTANEOUS | Status: DC

## 2015-10-01 NOTE — Discharge Instructions (Signed)
Use medication as prescribed. Rest. Drink plenty of fluids. Continue to monitor area.   Follow up with your primary care physician this week as needed. Return to Urgent care for new or worsening concerns.    Tick Bite Information Ticks are insects that attach themselves to the skin and draw blood for food. There are various types of ticks. Common types include wood ticks and deer ticks. Most ticks live in shrubs and grassy areas. Ticks can climb onto your body when you make contact with leaves or grass where the tick is waiting. The most common places on the body for ticks to attach themselves are the scalp, neck, armpits, waist, and groin. Most tick bites are harmless, but sometimes ticks carry germs that cause diseases. These germs can be spread to a person during the tick's feeding process. The chance of a disease spreading through a tick bite depends on:   The type of tick.  Time of year.   How long the tick is attached.   Geographic location.  HOW CAN YOU PREVENT TICK BITES? Take these steps to help prevent tick bites when you are outdoors:  Wear protective clothing. Long sleeves and long pants are best.   Wear white clothes so you can see ticks more easily.  Tuck your pant legs into your socks.   If walking on a trail, stay in the middle of the trail to avoid brushing against bushes.  Avoid walking through areas with long grass.  Put insect repellent on all exposed skin and along boot tops, pant legs, and sleeve cuffs.   Check clothing, hair, and skin repeatedly and before going inside.   Brush off any ticks that are not attached.  Take a shower or bath as soon as possible after being outdoors.  WHAT IS THE PROPER WAY TO REMOVE A TICK? Ticks should be removed as soon as possible to help prevent diseases caused by tick bites. 1. If latex gloves are available, put them on before trying to remove a tick.  2. Using fine-point tweezers, grasp the tick as close to  the skin as possible. You may also use curved forceps or a tick removal tool. Grasp the tick as close to its head as possible. Avoid grasping the tick on its body. 3. Pull gently with steady upward pressure until the tick lets go. Do not twist the tick or jerk it suddenly. This may break off the tick's head or mouth parts. 4. Do not squeeze or crush the tick's body. This could force disease-carrying fluids from the tick into your body.  5. After the tick is removed, wash the bite area and your hands with soap and water or other disinfectant such as alcohol. 6. Apply a small amount of antiseptic cream or ointment to the bite site.  7. Wash and disinfect any instruments that were used.  Do not try to remove a tick by applying a hot match, petroleum jelly, or fingernail polish to the tick. These methods do not work and may increase the chances of disease being spread from the tick bite.  WHEN SHOULD YOU SEEK MEDICAL CARE? Contact your health care provider if you are unable to remove a tick from your skin or if a part of the tick breaks off and is stuck in the skin.  After a tick bite, you need to be aware of signs and symptoms that could be related to diseases spread by ticks. Contact your health care provider if you develop any of the  following in the days or weeks after the tick bite:  Unexplained fever.  Rash. A circular rash that appears days or weeks after the tick bite may indicate the possibility of Lyme disease. The rash may resemble a target with a bull's-eye and may occur at a different part of your body than the tick bite.  Redness and swelling in the area of the tick bite.   Tender, swollen lymph glands.   Diarrhea.   Weight loss.   Cough.   Fatigue.   Muscle, joint, or bone pain.   Abdominal pain.   Headache.   Lethargy or a change in your level of consciousness.  Difficulty walking or moving your legs.   Numbness in the legs.   Paralysis.  Shortness  of breath.   Confusion.   Repeated vomiting.    This information is not intended to replace advice given to you by your health care provider. Make sure you discuss any questions you have with your health care provider.   Document Released: 04/07/2000 Document Revised: 05/01/2014 Document Reviewed: 09/18/2012 Elsevier Interactive Patient Education [employer].

## 2015-10-01 NOTE — ED Notes (Signed)
Pt states he found a tick on his right lower leg on Tuesday and states the area is a little red now and itching.Marland Kitchen

## 2015-10-01 NOTE — ED Provider Notes (Signed)
[facility]  ____________________________________________  Time seen: Approximately 9:28 AM  I have reviewed the triage vital signs and the nursing notes.   HISTORY  Chief Complaint Insect Bite   HPI Kirk Russell is a 70 y.o. male presents with wife at bedside for the complaints of tick bite to right inner leg. Patient reports concern over tick bite area as area is red still. Patient reports this past Tuesday evening he removed a tick from that same area. Patient reports that the tick would not have been attached more than 4-5 hours. States the area is mildly itchy but denies any pain or drainage. Denies putting any medications on the same. Denies other complaints.  Denies rash, fevers, body aches, joint pains, headache, fevers, nausea, vomiting, abdominal pain, weakness, chest pain, shortness of breath or other complaints. Reports continues to eat and drink well. Reports has continued to remain active. Denies extremity pain or extremity swelling.  PCP: Leonel Ramsay, MD    Past Medical History  Diagnosis Date  . Hypertension   . Hypercholesteremia   . Diabetes mellitus without complication ([place])   . Coronary artery disease   . Arthritis     There are no active problems to display for this patient.   Past Surgical History  Procedure Laterality Date  . Coronary angioplasty with stent placement    . Abdominal surgery      Current Outpatient Rx  Name  Route  Sig  Dispense  Refill  . dorzolamide-timolol (COSOPT) 22.3-6.8 MG/ML ophthalmic solution      1 drop 2 (two) times daily.         Marland Kitchen latanoprost (XALATAN) 0.005 % ophthalmic solution      1 drop at bedtime.         Marland Kitchen lisinopril (PRINIVIL,ZESTRIL) 10 MG tablet   Oral   Take 10 mg by mouth daily.         Marland Kitchen lovastatin (MEVACOR) 20 MG tablet   Oral   Take 20 mg by mouth at bedtime.           Allergies Review of patient's allergies indicates no known allergies.  No family  history on file.  Social History Social History  Substance Use Topics  . Smoking status: Former Research scientist (life sciences)  . Smokeless tobacco: Former Systems developer  . Alcohol Use: 4.8 oz/week    8 Cans of beer per week    Review of Systems Constitutional: No fever/chills Eyes: No visual changes. ENT: No sore throat. Cardiovascular: Denies chest pain. Respiratory: Denies shortness of breath. Gastrointestinal: No abdominal pain.  No nausea, no vomiting.  No diarrhea.  No constipation. Genitourinary: Negative for dysuria. Musculoskeletal: Negative for back pain. Skin: Negative for rash.As above. Neurological: Negative for headaches, focal weakness or numbness.  10-point ROS otherwise negative.  ____________________________________________   PHYSICAL EXAM:  VITAL SIGNS: ED Triage Vitals  Enc Vitals Group     BP 10/01/15 0903 114/82 mmHg     Pulse Rate 10/01/15 0903 71     Resp 10/01/15 0903 18     Temp 10/01/15 0903 98.1 F (36.7 C)     Temp Source 10/01/15 0903 Oral     SpO2 10/01/15 0903 100 %     Weight 10/01/15 0903 207 lb (93.895 kg)     Height 10/01/15 0903 5\' 9"  (1.753 m)     Head Cir --      Peak Flow --      Pain Score --  Pain Loc --      Pain Edu? --      Excl. in [place]? --     Constitutional: Alert and oriented. Well appearing and in no acute distress. Eyes: Conjunctivae are normal. PERRL. EOMI. Head: Atraumatic.  Ears: No external tenderness, swelling or erythema bilaterally.  Nose: No congestion/rhinnorhea.  Mouth/Throat: Mucous membranes are moist.   Neck: No stridor.  No cervical spine tenderness to palpation. Hematological/Lymphatic/Immunilogical: No cervical lymphadenopathy. Cardiovascular: Normal rate, regular rhythm. Grossly normal heart sounds.  Good peripheral circulation. Respiratory: Normal respiratory effort.  No retractions. Lungs CTAB. No wheezes, rales or rhonchi. Gastrointestinal: Soft and nontender. No distention.  Musculoskeletal: No lower or upper  extremity tenderness nor edema. No cervical, thoracic or lumbar tenderness to palpation. No joint effusions. Bilateral pedal pulses equal and easily palpated.  Neurologic:  Normal speech and language. No gross focal neurologic deficits are appreciated. No gait instability. Skin:  Skin is warm, dry and intact. No rash noted. Except: Right medial lateral to posterior calf 0.5 cm of mild to moderate erythematous papule with minimal induration, no surrounding erythema, erythematous readings with central clearing, no fluctuance, nontender, no bull's-eye rash, no foreign bodies visualized, no drainage. No other rash noted. No foreign bodies or ticks noted. Note calf tenderness bilaterally. Right lower extremity is nontender and no edema. Psychiatric: Mood and affect are normal. Speech and behavior are normal.  ____________________________________________   LABS (all labs ordered are listed, but only abnormal results are displayed)  Labs Reviewed  B. BURGDORFI ANTIBODIES  ROCKY MTN SPOTTED FVR ABS PNL(IGG+IGM)   ____________________________________________  RADIOLOGY  No results found. ____________________________________________  INITIAL IMPRESSION / ASSESSMENT AND PLAN / ED COURSE  Pertinent labs & imaging results that were available during my care of the patient were reviewed by me and considered in my medical decision making (see chart for details).  Very well-appearing patient. No acute distress. Wife at bedside. Presents to evaluation of right inner leg tick bite in which she pulled off this past Tuesday. Denies other symptoms. Suspect local reaction from tick bite. Discussed in detail with patient including symptoms of tick disease processes. Will treat with topical Bactroban ointment. Will send for [facility] spotted fever and Lyme disease labs. Encourage monitoring at home. Encourage follow-up with PCP and sooner follow-up for other symptoms as discussed including fevers, weakness,  joint pains, rash, headaches, vision changes or other changes.Discussed indication, risks and benefits of medications with patient. Encouraged keeping clean and not scratching area.   Discussed follow up with Primary care physician this week. Discussed follow up and return parameters including no resolution or any worsening concerns. Patient verbalized understanding and agreed to plan.   ____________________________________________   FINAL CLINICAL IMPRESSION(S) / ED DIAGNOSES  Final diagnoses:  Tick bite of right lower leg, initial encounter     Discharge Medication List as of 10/01/2015  9:32 AM    START taking these medications   Details  mupirocin ointment (BACTROBAN) 2 % Apply three times a day for 5 days., Normal        Note: This dictation was prepared with Dragon dictation along with smaller phrase technology. Any transcriptional errors that result from this process are unintentional.       Marylene Land, NP 10/01/15 [phone]

## 2015-10-05 ENCOUNTER — Telehealth: Payer: Self-pay | Admitting: Emergency Medicine

## 2015-10-05 LAB — ROCKY MTN SPOTTED FVR ABS PNL(IGG+IGM)
RMSF IGG: POSITIVE — AB
RMSF IgM: 0.8 index (ref 0.00–0.89)

## 2015-10-05 LAB — B. BURGDORFI ANTIBODIES

## 2015-10-05 LAB — RMSF, IGG, IFA

## 2015-10-05 MED ORDER — DOXYCYCLINE HYCLATE 100 MG PO CAPS: 100.0000 mg | ORAL_CAPSULE | Freq: Two times a day (BID) | ORAL | Status: DC

## 2015-10-05 NOTE — Telephone Encounter (Signed)
-----   Message from Marylene Land, NP sent at 10/05/2015  5:26 PM EDT ----- Please call and check on patient, and inform patient of his lab results. One of [facility] labs positive IgG, indicating possible old infection, but concern that IgM may be now elevated as it may have been too early at that time as levels were in upper limits of normal range. Please start oral doxycycline and follow up with PCP.   Thanks Marylene Land

## 2015-10-05 NOTE — Telephone Encounter (Signed)
Doxycycline rx sent to pharmacy.

## 2016-02-03 ENCOUNTER — Ambulatory Visit: Payer: Self-pay

## 2016-02-14 ENCOUNTER — Emergency Department: Payer: Medicare Other

## 2016-02-14 ENCOUNTER — Emergency Department
Admission: EM | Admit: 2016-02-14 | Discharge: 2016-02-14 | Disposition: A | Discharge status: Home / Self Care | Payer: Medicare Other | Attending: Emergency Medicine | Admitting: Emergency Medicine

## 2016-02-14 ENCOUNTER — Encounter: Payer: Self-pay | Admitting: Emergency Medicine

## 2016-02-14 DIAGNOSIS — I10 Essential (primary) hypertension: Secondary | ICD-10-CM | POA: Diagnosis not present

## 2016-02-14 DIAGNOSIS — Z79899 Other long term (current) drug therapy: Secondary | ICD-10-CM | POA: Diagnosis not present

## 2016-02-14 DIAGNOSIS — Z87891 Personal history of nicotine dependence: Secondary | ICD-10-CM | POA: Diagnosis not present

## 2016-02-14 DIAGNOSIS — I2510 Atherosclerotic heart disease of native coronary artery without angina pectoris: Secondary | ICD-10-CM | POA: Insufficient documentation

## 2016-02-14 DIAGNOSIS — E119 Type 2 diabetes mellitus without complications: Secondary | ICD-10-CM | POA: Diagnosis not present

## 2016-02-14 DIAGNOSIS — R531 Weakness: Secondary | ICD-10-CM | POA: Insufficient documentation

## 2016-02-14 DIAGNOSIS — M542 Cervicalgia: Secondary | ICD-10-CM | POA: Diagnosis present

## 2016-02-14 LAB — COMPREHENSIVE METABOLIC PANEL
ALBUMIN: 4 g/dL (ref 3.5–5.0)
ALK PHOS: 44 U/L (ref 38–126)
ALT: 25 U/L (ref 17–63)
AST: 23 U/L (ref 15–41)
Anion gap: 6 (ref 5–15)
BILIRUBIN TOTAL: 1.2 mg/dL (ref 0.3–1.2)
BUN: 18 mg/dL (ref 6–20)
CALCIUM: 9.1 mg/dL (ref 8.9–10.3)
CO2: 26 mmol/L (ref 22–32)
CREATININE: 0.95 mg/dL (ref 0.61–1.24)
Chloride: 100 mmol/L — ABNORMAL LOW (ref 101–111)
GFR calc Af Amer: >60 mL/min (ref >60)
GFR calc non Af Amer: >60 mL/min (ref >60)
GLUCOSE: 185 mg/dL — AB (ref 65–99)
Potassium: 4.2 mmol/L (ref 3.5–5.1)
Sodium: 132 mmol/L — ABNORMAL LOW (ref 135–145)
TOTAL PROTEIN: 7.5 g/dL (ref 6.5–8.1)

## 2016-02-14 LAB — CBC
HCT: 42.6 % (ref 40.0–52.0)
Hemoglobin: 15 g/dL (ref 13.0–18.0)
MCH: 33.3 pg (ref 26.0–34.0)
MCHC: 35.3 g/dL (ref 32.0–36.0)
MCV: 94.2 fL (ref 80.0–100.0)
PLATELETS: 126 10*3/uL — AB (ref 150–440)
RBC: 4.52 MIL/uL (ref 4.40–5.90)
RDW: 12.6 % (ref 11.5–14.5)
WBC: 6.9 10*3/uL (ref 3.8–10.6)

## 2016-02-14 LAB — LIPASE, BLOOD: Lipase: 29 U/L (ref 11–51)

## 2016-02-14 LAB — TROPONIN I: Troponin I: <0.03 ng/mL (ref <0.03)

## 2016-02-14 LAB — GLUCOSE, CAPILLARY: GLUCOSE-CAPILLARY: 184 mg/dL — AB (ref 65–99)

## 2016-02-14 MED ORDER — ONDANSETRON 4 MG PO TBDP
ORAL_TABLET | ORAL | Status: AC
  Filled 2016-02-14: qty 1

## 2016-02-14 MED ORDER — SODIUM CHLORIDE 0.9 % IV BOLUS (SEPSIS)
1000.0000 mL | Freq: Once | INTRAVENOUS | Status: AC
  Administered 2016-02-14: 1000 mL via INTRAVENOUS

## 2016-02-14 MED ORDER — ONDANSETRON HCL 4 MG/2ML IJ SOLN
4.0000 mg | Freq: Once | INTRAMUSCULAR | Status: AC
  Administered 2016-02-14: 4 mg via INTRAVENOUS
  Filled 2016-02-14: qty 2

## 2016-02-14 MED ORDER — ONDANSETRON 4 MG PO TBDP
4.0000 mg | ORAL_TABLET | Freq: Once | ORAL | Status: AC | PRN
  Administered 2016-02-14: 4 mg via ORAL

## 2016-02-14 MED ORDER — IOPAMIDOL (ISOVUE-370) INJECTION 76%
75.0000 mL | Freq: Once | INTRAVENOUS | Status: AC | PRN
  Administered 2016-02-14: 75 mL via INTRAVENOUS

## 2016-02-14 NOTE — Discharge Instructions (Signed)
Please follow-up with neurology by calling the number provided for further evaluation. You have been provided a copy of your CT report please bring this to neurology. Please follow-up your primary care doctor in the next several days for recheck/reevaluation. Drink plenty of fluids and obtain plenty of rest. Return to the emergency department for any increased weakness, slurred speech, confusion, any weakness/numbness of any arm/leg, or if you develop a fever.

## 2016-02-14 NOTE — ED Triage Notes (Signed)
Pt woke up around 0400 this morning with extreme nausea. Pt denies vomiting at this time and states that he has been experiencing left sided neck pain x2 days. Pt is diaphoretic and pale in triage. Pt is alert and oriented at this time.

## 2016-02-14 NOTE — ED Notes (Signed)
MD at bedside. 

## 2016-02-14 NOTE — ED Provider Notes (Signed)
[name] Emergency Department Provider Note  Time seen: 7:55 AM  I have reviewed the triage vital signs and the nursing notes.   HISTORY  Chief Complaint Nausea and Neck Pain    HPI Kirk Russell is a 70 y.o. male with a past medical history of CAD, diabetes, hypertension, hyperlipidemia, who presents to the emergency departmentwith nausea, diaphoresis. According to the patient he woke up about 4:00 in the morning with nausea, diaphoresis, and left-sided neck pain the past 2 days. Patient states the pain was in the back and neck, somewhat worse if he turns his head. Denies any focal weakness or numbness. He does state a feeling of generalized weakness with a feeling of "heaviness" behind the eyes. Denies any pain. Denies any abdominal or chest pain. Patient does have a history of a coronary stent 5 years ago, but denies any chest pain, shortness of breath today. Patient states nausea but denies any vomiting.  Past Medical History:  Diagnosis Date  . Arthritis   . Coronary artery disease   . Diabetes mellitus without complication ([place])   . Hypercholesteremia   . Hypertension     There are no active problems to display for this patient.   Past Surgical History:  Procedure Laterality Date  . ABDOMINAL SURGERY    . CORONARY ANGIOPLASTY WITH STENT PLACEMENT      Prior to Admission medications   Medication Sig Start Date End Date Taking? Authorizing Provider  dorzolamide-timolol (COSOPT) 22.3-6.8 MG/ML ophthalmic solution 1 drop 2 (two) times daily.    Historical Provider, MD  doxycycline (VIBRAMYCIN) 100 MG capsule Take 1 capsule (100 mg total) by mouth 2 (two) times daily. Wear sunscreen and avoid excessive direct sunlight. 10/05/15   Marylene Land, NP  latanoprost (XALATAN) 0.005 % ophthalmic solution 1 drop at bedtime.    Historical Provider, MD  lisinopril (PRINIVIL,ZESTRIL) 10 MG tablet Take 10 mg by mouth daily.    Historical Provider, MD   lovastatin (MEVACOR) 20 MG tablet Take 20 mg by mouth at bedtime.    Historical Provider, MD  mupirocin ointment (BACTROBAN) 2 % Apply three times a day for 5 days. 10/01/15   Marylene Land, NP    No Known Allergies  No family history on file.  Social History Social History  Substance Use Topics  . Smoking status: Former Research scientist (life sciences)  . Smokeless tobacco: Former Systems developer  . Alcohol use 4.8 oz/week    8 Cans of beer per week    Review of Systems Constitutional: Negative for fever. Cardiovascular: Negative for chest pain. Respiratory: Negative for shortness of breath. Gastrointestinal: Negative for abdominal pain. Positive for nausea. Negative for diarrhea or vomiting. Genitourinary: Negative for dysuria Neurological: Negative for headache 10-point ROS otherwise negative.  ____________________________________________   PHYSICAL EXAM:  VITAL SIGNS: ED Triage Vitals  Enc Vitals Group     BP 02/14/16 0604 133/75     Pulse Rate 02/14/16 0604 (!) 56     Resp 02/14/16 0604 18     Temp 02/14/16 0604 97.5 F (36.4 C)     Temp Source 02/14/16 0604 Oral     SpO2 02/14/16 0604 96 %     Weight 02/14/16 0601 210 lb (95.3 kg)     Height 02/14/16 0601 5\' 9"  (1.753 m)     Head Circumference --      Peak Flow --      Pain Score --      Pain Loc --  Pain Edu? --      Excl. in [place]? --     Constitutional: Alert and oriented. Well appearing and in no distress. Eyes: Normal exam ENT   Head: Normocephalic and atraumatic   Mouth/Throat: Mucous membranes are moist. Cardiovascular: Normal rate, regular rhythm. No murmur Respiratory: Normal respiratory effort without tachypnea nor retractions. Breath sounds are clear Gastrointestinal: Soft and nontender. No distention.  Musculoskeletal: Nontender with normal range of motion in all extremities.  Neurologic:  Normal speech and language. No gross focal neurologic deficits. Equal grip strengths bilaterally. No pronator drift. Sensation  intact. Cranial nerves intact. Skin:  Skin is warm, dry and intact.  Psychiatric: Mood and affect are normal. Speech and behavior are normal.   ____________________________________________    EKG  EKG reviewed and interpreted by myself shows sinus rhythm at 61 bpm, narrow QRS, normal axis, normal intervals, nonspecific ST changes without ST elevation.  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  ____________________________________________   INITIAL IMPRESSION / ASSESSMENT AND PLAN / ED COURSE  Pertinent labs & imaging results that were available during my care of the patient were reviewed by me and considered in my medical decision making (see chart for details).  The patient presents to the emergency department with sudden onset of nausea and diaphoresis from 4:00 this morning. Patient denies any chest pain or abdominal pain. No focal weakness or numbness. Patient has a largely normal exam. States mild nausea but much improved from earlier. Patient does continue to stay left-sided neck pain, denies any injury. Along with a sense of generalized weakness and heaviness behind his eyes per patient will proceed with CT angiography of the head and neck to rule out arterial injury/occlusion. We will check labs including cardiac enzymes 2.  Labs are largely within normal limits. Troponin negative 2. 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 Patient states he has been diagnosed with vertigo in the past. I wonder if the vertigo he has experienced in the past could possibly be small cerebellar infarcts. I discussed these results with the patient and his wife and recommended a follow-up with a neurologist. They state they will do so. With a normal workup today, no dizziness or neurologic deficits on exam, I believe the patient is safe for discharge home. I discussed with the  patient drinking plenty of fluids and obtaining plenty of rest. Following up with his primary care doctor in 1-2 days for recheck. I also discussed very strict return precautions for any increased weakness especially focal weakness/numbness, confusion, slurred speech, or fever. Patient is agreeable.  ____________________________________________   FINAL CLINICAL IMPRESSION(S) / ED DIAGNOSES  Nausea Generalized weakness    Harvest Dark, MD 02/14/16 1109

## 2016-02-14 NOTE — ED Notes (Signed)
Report from Janie, RN. Care assumed by this RN. 

## 2016-02-14 NOTE — ED Notes (Signed)
Pt alert and oriented X4, active, cooperative, pt in NAD. RR even and unlabored, color WNL.  Pt informed to return if any life threatening symptoms occur.   

## 2016-02-16 ENCOUNTER — Encounter: Payer: Self-pay | Admitting: Urology

## 2016-02-16 ENCOUNTER — Ambulatory Visit (INDEPENDENT_AMBULATORY_CARE_PROVIDER_SITE_OTHER): Payer: Medicare Other | Admitting: Urology

## 2016-02-16 VITALS — BP 125/76 | HR 68 | Ht 69.0 in | Wt 210.8 lb

## 2016-02-16 DIAGNOSIS — N400 Enlarged prostate without lower urinary tract symptoms: Secondary | ICD-10-CM | POA: Diagnosis not present

## 2016-02-16 DIAGNOSIS — D696 Thrombocytopenia, unspecified: Secondary | ICD-10-CM | POA: Insufficient documentation

## 2016-02-16 DIAGNOSIS — R42 Dizziness and giddiness: Secondary | ICD-10-CM | POA: Insufficient documentation

## 2016-02-16 DIAGNOSIS — G8929 Other chronic pain: Secondary | ICD-10-CM | POA: Diagnosis not present

## 2016-02-16 DIAGNOSIS — E119 Type 2 diabetes mellitus without complications: Secondary | ICD-10-CM | POA: Insufficient documentation

## 2016-02-16 DIAGNOSIS — M546 Pain in thoracic spine: Secondary | ICD-10-CM | POA: Diagnosis not present

## 2016-02-16 DIAGNOSIS — I7779 Dissection of other artery: Secondary | ICD-10-CM | POA: Insufficient documentation

## 2016-02-16 DIAGNOSIS — R3912 Poor urinary stream: Secondary | ICD-10-CM

## 2016-02-16 DIAGNOSIS — N139 Obstructive and reflux uropathy, unspecified: Secondary | ICD-10-CM

## 2016-02-16 DIAGNOSIS — H8109 Meniere's disease, unspecified ear: Secondary | ICD-10-CM | POA: Insufficient documentation

## 2016-02-16 LAB — URINALYSIS, COMPLETE
BILIRUBIN UA: NEGATIVE
Glucose, UA: NEGATIVE
Ketones, UA: NEGATIVE
LEUKOCYTES UA: NEGATIVE
NITRITE UA: NEGATIVE
PH UA: 6 (ref 5.0–7.5)
Protein, UA: NEGATIVE
RBC UA: NEGATIVE
Specific Gravity, UA: 1.015 (ref 1.005–1.030)
Urobilinogen, Ur: 0.2 mg/dL (ref 0.2–1.0)

## 2016-02-16 LAB — MICROSCOPIC EXAMINATION: Bacteria, UA: NONE SEEN

## 2016-02-16 LAB — BLADDER SCAN AMB NON-IMAGING: SCAN RESULT: 43

## 2016-02-16 MED ORDER — TAMSULOSIN HCL 0.4 MG PO CAPS: 0.4000 mg | ORAL_CAPSULE | Freq: Every day | ORAL | 11 refills | Status: DC

## 2016-02-16 NOTE — Progress Notes (Addendum)
.   02/16/2016  9:18 AM   Kirk Russell Aug 29, 1945 [id number]  Referring provider: Leonel Ramsay, MD [address], [place] [address]  Chief Complaint  Patient presents with  . New Patient (Initial Visit)    BPH    HPI: Pt seen today for BPH and LUTS. AUASS = 19, qol = 5; high on weak stream, nocturia. He underwent a CT A/P in 2016 and the GU tract appeared normal with an average prostate size. He drinks water before bed. He has no diuretic use and no LE edema. He has a h/o of BPH and Dr. Eliberto Ivory did an out patient procedure. Maybe about 6 yrs ago.   PVR 43 ml.   He's also had back pain which he associates with getting up at night to urinate. Getting up and stretching helps the pain. He went to the ED for neck pain and "cold sweats", nasuea and dizziness. His 01/2016 bun 18, cr 0.95. Something is "making him feel like crap".   12/2015 PSA 0.4     PMH: Past Medical History:  Diagnosis Date  . Arthritis   . Coronary artery disease   . Diabetes mellitus without complication ([place])   . Hypercholesteremia   . Hypertension     Surgical History: Past Surgical History:  Procedure Laterality Date  . ABDOMINAL SURGERY    . CORONARY ANGIOPLASTY WITH STENT PLACEMENT      Home Medications:    Medication List       Accurate as of 02/16/16  9:18 AM. Always use your most recent med list.          aspirin EC 81 MG tablet Take 81 mg by mouth daily.   brimonidine 0.2 % ophthalmic solution Commonly known as:  ALPHAGAN Place 1 drop into both eyes 2 (two) times daily.   dorzolamide-timolol 22.3-6.8 MG/ML ophthalmic solution Commonly known as:  COSOPT 1 drop 2 (two) times daily.   FLUZONE HIGH-DOSE 0.5 ML Susy Generic drug:  Influenza Vac Split High-Dose Inject 0.5 mLs into the muscle once.   latanoprost 0.005 % ophthalmic solution Commonly known as:  XALATAN Place 1 drop into both eyes at bedtime.     lisinopril 10 MG tablet Commonly known as:  PRINIVIL,ZESTRIL Take 10 mg by mouth daily.   lovastatin 20 MG tablet Commonly known as:  MEVACOR Take by mouth.   timolol 0.5 % ophthalmic solution Commonly known as:  TIMOPTIC Place 1 drop into the left eye 2 (two) times daily.       Allergies:  Allergies  Allergen Reactions  . Lovastatin     Other reaction(s): Muscle Pain With higher doses    Family History: Family History  Problem Relation Age of Onset  . Prostate cancer Neg Hx   . Kidney cancer Neg Hx   . Bladder Cancer Neg Hx     Social History:  reports that he has quit smoking. He has quit using smokeless tobacco. He reports that he drinks about 4.8 oz of alcohol per week . He reports that he does not use drugs.  ROS: UROLOGY Frequent Urination?: Yes Hard to postpone urination?: Yes Burning/pain with urination?: No Get up at night to urinate?: Yes Leakage of urine?: No Urine stream starts and stops?: No Trouble starting stream?: No Do you have to strain to urinate?: No Blood in urine?: No Urinary tract infection?: No Sexually transmitted disease?: No Injury to kidneys or bladder?: No Painful intercourse?: No  Weak stream?: Yes Erection problems?: Yes Penile pain?: No  Gastrointestinal Nausea?: No Vomiting?: No Indigestion/heartburn?: No Diarrhea?: No Constipation?: No  Constitutional Fever: No Night sweats?: Yes Weight loss?: No Fatigue?: No  Skin Skin rash/lesions?: Yes Itching?: Yes  Eyes Blurred vision?: Yes Double vision?: No  Ears/Nose/Throat Sore throat?: No Sinus problems?: No  Hematologic/Lymphatic Swollen glands?: No Easy bruising?: Yes  Cardiovascular Leg swelling?: No Chest pain?: No  Respiratory Cough?: Yes Shortness of breath?: No  Endocrine Excessive thirst?: Yes  Musculoskeletal Back pain?: Yes Joint pain?: Yes  Neurological Headaches?: No Dizziness?: No  Psychologic Depression?: No Anxiety?:  No  Physical Exam: BP 125/76 (BP Location: Left Arm, Patient Position: Sitting, Cuff Size: Normal)   Pulse 68   Ht 5\' 9"  (1.753 m)   Wt 95.6 kg (210 lb 12.8 oz)   BMI 31.13 kg/m   Constitutional:  Alert and oriented, No acute distress. HEENT: [place] AT, moist mucus membranes.  Trachea midline, no masses. Cardiovascular: No clubbing, cyanosis, or edema. Respiratory: Normal respiratory effort, no increased work of breathing. GI: Abdomen is soft, nontender, nondistended, no abdominal masses GU: No CVA tenderness. Skin: No rashes, bruises or suspicious lesions. Lymph: No cervical or inguinal adenopathy. Neurologic: Grossly intact, no focal deficits, moving all 4 extremities. Psychiatric: Normal mood and affect. DRE: prostate about 30 grams. Palpably normal without hard area or nodule. Landmarks preserved.   Laboratory Data: Lab Results  Component Value Date   WBC 6.9 02/14/2016   HGB 15.0 02/14/2016   HCT 42.6 02/14/2016   MCV 94.2 02/14/2016   PLT 126 (L) 02/14/2016    Lab Results  Component Value Date   CREATININE 0.95 02/14/2016    No results found for: PSA  No results found for: TESTOSTERONE  No results found for: HGBA1C  Urinalysis No results found for: COLORURINE, APPEARANCEUR, LABSPEC, PHURINE, GLUCOSEU, HGBUR, BILIRUBINUR, KETONESUR, PROTEINUR, UROBILINOGEN, NITRITE, LEUKOCYTESUR  Pertinent Imaging: CT above  Assessment & Plan:    1. Benign prostatic hyperplasia, unspecified whether lower urinary tract symptoms present -discussed nature, risks and benefits of trial of tamsulosin and he elects to proceed. May need cystoscopy down the road to check for BPH regrowth and/or stricture.  - Urinalysis, Complete  2. Back pain - does not sound like a GU issues, but nonetheless will assess kidneys with a renal u/s. Exam nl today, PSA low and UA normal.    No Follow-up on file.  Festus Aloe, [facility] [address], [place], [place] [address] [phone]

## 2016-02-24 ENCOUNTER — Ambulatory Visit
Admission: RE | Admit: 2016-02-24 | Discharge: 2016-02-24 | Disposition: A | Discharge status: Home / Self Care | Payer: Medicare Other | Source: Ambulatory Visit | Attending: Urology | Admitting: Urology

## 2016-02-24 DIAGNOSIS — R3912 Poor urinary stream: Secondary | ICD-10-CM | POA: Diagnosis present

## 2016-02-24 DIAGNOSIS — N139 Obstructive and reflux uropathy, unspecified: Secondary | ICD-10-CM | POA: Diagnosis not present

## 2016-02-29 ENCOUNTER — Telehealth: Payer: Self-pay

## 2016-02-29 NOTE — Telephone Encounter (Signed)
Kirk Aloe, MD  [place]        Notify patient his renal u/s was normal. Kidneys and bladder look fine. He can see [place] PRN.    Spoke with pt in reference to RUS results. Made aware can f/u PRN. Pt voiced understanding.

## 2016-03-20 ENCOUNTER — Ambulatory Visit: Payer: Medicare Other

## 2016-06-13 DIAGNOSIS — R351 Nocturia: Secondary | ICD-10-CM

## 2016-06-13 DIAGNOSIS — N401 Enlarged prostate with lower urinary tract symptoms: Secondary | ICD-10-CM | POA: Insufficient documentation

## 2016-07-21 DIAGNOSIS — N138 Other obstructive and reflux uropathy: Secondary | ICD-10-CM | POA: Insufficient documentation

## 2016-07-21 DIAGNOSIS — N401 Enlarged prostate with lower urinary tract symptoms: Secondary | ICD-10-CM

## 2016-07-21 DIAGNOSIS — R3912 Poor urinary stream: Secondary | ICD-10-CM | POA: Insufficient documentation

## 2016-07-21 DIAGNOSIS — R339 Retention of urine, unspecified: Secondary | ICD-10-CM | POA: Insufficient documentation

## 2016-07-21 DIAGNOSIS — Z87448 Personal history of other diseases of urinary system: Secondary | ICD-10-CM | POA: Insufficient documentation

## 2016-08-10 DIAGNOSIS — N99111 Postprocedural bulbous urethral stricture: Secondary | ICD-10-CM | POA: Insufficient documentation

## 2016-09-15 DIAGNOSIS — R300 Dysuria: Secondary | ICD-10-CM | POA: Insufficient documentation

## 2016-12-08 ENCOUNTER — Ambulatory Visit
Admission: RE | Admit: 2016-12-08 | Discharge: 2016-12-08 | Disposition: A | Discharge status: Home / Self Care | Payer: Medicare Other | Source: Ambulatory Visit | Attending: Family Medicine | Admitting: Family Medicine

## 2016-12-08 ENCOUNTER — Other Ambulatory Visit: Payer: Self-pay | Admitting: Family Medicine

## 2016-12-08 DIAGNOSIS — N50812 Left testicular pain: Secondary | ICD-10-CM

## 2016-12-12 DIAGNOSIS — N453 Epididymo-orchitis: Secondary | ICD-10-CM | POA: Insufficient documentation

## 2017-03-30 DIAGNOSIS — R194 Change in bowel habit: Secondary | ICD-10-CM | POA: Insufficient documentation

## 2017-07-13 ENCOUNTER — Encounter: Payer: Self-pay | Admitting: Student

## 2017-07-16 ENCOUNTER — Encounter: Admission: RE | Payer: Self-pay | Source: Ambulatory Visit

## 2017-07-16 ENCOUNTER — Ambulatory Visit: Admission: RE | Admit: 2017-07-16 | Payer: Medicare Other | Source: Ambulatory Visit | Admitting: Gastroenterology

## 2017-07-16 HISTORY — DX: Insomnia, unspecified: G47.00

## 2017-07-16 HISTORY — DX: Dissection of other specified artery: I77.79

## 2017-07-16 HISTORY — DX: Meniere's disease, unspecified ear: H81.09

## 2017-07-16 HISTORY — DX: Dizziness and giddiness: R42

## 2017-07-16 HISTORY — DX: Diverticulosis of intestine, part unspecified, without perforation or abscess without bleeding: K57.90

## 2017-07-16 HISTORY — DX: Unspecified hearing loss, unspecified ear: H91.90

## 2017-07-16 HISTORY — DX: Thrombocytopenia, unspecified: D69.6

## 2017-07-16 HISTORY — DX: Chronic obstructive pulmonary disease, unspecified: J44.9

## 2017-07-16 HISTORY — DX: Unspecified glaucoma: H40.9

## 2017-07-16 SURGERY — COLONOSCOPY WITH PROPOFOL
Anesthesia: General

## 2017-12-26 ENCOUNTER — Encounter: Payer: Self-pay | Admitting: Urology

## 2017-12-26 ENCOUNTER — Ambulatory Visit (INDEPENDENT_AMBULATORY_CARE_PROVIDER_SITE_OTHER): Payer: Medicare Other | Admitting: Urology

## 2017-12-26 VITALS — BP 116/73 | HR 70 | Ht 69.0 in | Wt 210.2 lb

## 2017-12-26 DIAGNOSIS — R3912 Poor urinary stream: Secondary | ICD-10-CM | POA: Diagnosis not present

## 2017-12-26 DIAGNOSIS — N400 Enlarged prostate without lower urinary tract symptoms: Secondary | ICD-10-CM

## 2017-12-26 LAB — URINALYSIS, COMPLETE
Bilirubin, UA: NEGATIVE
KETONES UA: NEGATIVE
Leukocytes, UA: NEGATIVE
Nitrite, UA: NEGATIVE
Protein, UA: NEGATIVE
RBC UA: NEGATIVE
Specific Gravity, UA: 1.02 (ref 1.005–1.030)
Urobilinogen, Ur: 0.2 mg/dL (ref 0.2–1.0)
pH, UA: 5 (ref 5.0–7.5)

## 2017-12-26 LAB — BLADDER SCAN AMB NON-IMAGING

## 2017-12-26 MED ORDER — TAMSULOSIN HCL 0.4 MG PO CAPS: 0.4000 mg | ORAL_CAPSULE | Freq: Every day | ORAL | 1 refills | Status: DC

## 2017-12-26 NOTE — Progress Notes (Signed)
12/26/2017 9:58 AM   Kirk Russell 1946/02/12 [id number]  Referring provider: Leonel Ramsay, MD [place], [place] [address]  Chief Complaint  Patient presents with  . Benign Prostatic Hypertrophy    HPI: 72 year old male with a history of BPH and urethral stricture disease.  He saw Dr. Junious Silk in October 2017 for BPH with lower urinary tract symptoms.  He subsequently saw Dr. Jacqlyn Larsen at [facility] and was diagnosed with a bulb stricture.  He underwent cystoscopy with urethral dilation in May 2018 and was subsequently put on twice daily intermittent catheterization for 3 months.  He was having symptomatic UTIs with flulike symptoms every 2 to 3 weeks with intermittent catheterization and subsequently discontinued.  Over the last few months he has noted decreased force and caliber of his urinary stream.  He has nocturia x2-4.  He denies dysuria or gross hematuria.  He has not had recurrent UTIs since discontinuing intermittent catheterization.   PMH: Past Medical History:  Diagnosis Date  . Arthritis   . 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 COPD (chronic obstructive pulmonary disease) ([place])   . Coronary artery disease   . Diabetes mellitus without complication ([place])   . Diverticulosis   . Glaucoma   . Hearing impairment   . Hypercholesteremia   . Hypertension   . Insomnia   . Meniere syndrome   . Thrombocytopenia ([place])   . Vertigo     Surgical History: Past Surgical History:  Procedure Laterality Date  . ABDOMINAL SURGERY    . APPENDECTOMY    . CORONARY ANGIOPLASTY WITH STENT PLACEMENT      Home Medications:  Allergies as of 12/26/2017      Reactions   Lovastatin    Other reaction(s): Muscle Pain With higher doses      Medication List        Accurate as of 12/26/17  9:58 AM. Always use your most recent med list.          albuterol 108 (90 Base) MCG/ACT inhaler Commonly known as:  PROVENTIL HFA;VENTOLIN HFA Inhale 2 puffs into the lungs  every 6 (six) hours as needed for wheezing or shortness of breath.   ANORO ELLIPTA 62.5-25 MCG/INH Aepb Generic drug:  umeclidinium-vilanterol Inhale 1 puff into the lungs daily.   aspirin EC 81 MG tablet Take 81 mg by mouth daily.   brimonidine 0.2 % ophthalmic solution Commonly known as:  ALPHAGAN Place 1 drop into both eyes 2 (two) times daily.   Co-Enzyme Q10 200 MG Caps Take by mouth.   dorzolamide-timolol 22.3-6.8 MG/ML ophthalmic solution Commonly known as:  COSOPT 1 drop 2 (two) times daily.   FLUZONE HIGH-DOSE 0.5 ML injection Generic drug:  Influenza vac split quadrivalent PF Inject 0.5 mLs into the muscle once.   Glucosamine HCl 1500 MG Tabs Take by mouth.   Glucosamine-Chondroitin 250-200 MG Caps Take by mouth.   latanoprost 0.005 % ophthalmic solution Commonly known as:  XALATAN Place 1 drop into both eyes at bedtime.   lisinopril 10 MG tablet Commonly known as:  PRINIVIL,ZESTRIL Take 10 mg by mouth daily.   lovastatin 20 MG tablet Commonly known as:  MEVACOR Take by mouth.   Melatonin 10 MG Tabs Take by mouth.   metFORMIN 500 MG tablet Commonly known as:  GLUCOPHAGE Take 500 mg by mouth 2 (two) times daily with a meal.   polyethylene glycol packet Commonly known as:  MIRALAX / GLYCOLAX Take 17 g by mouth daily.  sildenafil 20 MG tablet Commonly known as:  REVATIO Take 20 mg by mouth 3 (three) times daily.   timolol 0.5 % ophthalmic solution Commonly known as:  TIMOPTIC Place 1 drop into the left eye 2 (two) times daily.   traZODone 50 MG tablet Commonly known as:  DESYREL Take 50 mg by mouth at bedtime.       Allergies:  Allergies  Allergen Reactions  . Lovastatin     Other reaction(s): Muscle Pain With higher doses    Family History: Family History  Problem Relation Age of Onset  . Breast cancer Mother   . Prostate cancer Neg Hx   . Kidney cancer Neg Hx   . Bladder Cancer Neg Hx     Social History:  reports that he  has quit smoking. He has quit using smokeless tobacco. He reports that he drinks about 8.0 standard drinks of alcohol per week. He reports that he does not use drugs.  ROS: UROLOGY Frequent Urination?: Yes Hard to postpone urination?: Yes Burning/pain with urination?: No Get up at night to urinate?: Yes Leakage of urine?: No Urine stream starts and stops?: Yes Trouble starting stream?: No Do you have to strain to urinate?: No Blood in urine?: No Urinary tract infection?: Yes Sexually transmitted disease?: No Injury to kidneys or bladder?: No Painful intercourse?: No Weak stream?: Yes Erection problems?: Yes Penile pain?: No  Gastrointestinal Nausea?: No Vomiting?: No Indigestion/heartburn?: No Diarrhea?: No Constipation?: No  Constitutional Fever: No Night sweats?: Yes Weight loss?: No Fatigue?: No  Skin Skin rash/lesions?: Yes Itching?: No  Eyes Blurred vision?: No Double vision?: No  Ears/Nose/Throat Sore throat?: No Sinus problems?: No  Hematologic/Lymphatic Swollen glands?: No Easy bruising?: Yes  Cardiovascular Leg swelling?: No Chest pain?: No  Respiratory Cough?: No Shortness of breath?: No  Endocrine Excessive thirst?: No  Musculoskeletal Back pain?: Yes Joint pain?: Yes  Neurological Headaches?: No Dizziness?: No  Psychologic Depression?: No Anxiety?: No  Physical Exam: BP 116/73 (BP Location: Left Arm, Patient Position: Sitting, Cuff Size: Large)   Pulse 70   Ht 5\' 9"  (1.753 m)   Wt 210 lb 3.2 oz (95.3 kg)   BMI 31.04 kg/m   Constitutional:  Alert and oriented, No acute distress. HEENT: [place] AT, moist mucus membranes.  Trachea midline, no masses. Cardiovascular: No clubbing, cyanosis, or edema. Respiratory: Normal respiratory effort, no increased work of breathing. GI: Abdomen is soft, nontender, nondistended, no abdominal masses GU: No CVA tenderness Lymph: No cervical or inguinal lymphadenopathy. Skin: No rashes, bruises  or suspicious lesions. Neurologic: Grossly intact, no focal deficits, moving all 4 extremities. Psychiatric: Normal mood and affect.  Laboratory Data:  Urinalysis Dipstick 2+ glucose Microscopy negative   Assessment & Plan:   72 year old male with BPH and history of urethral stricture.  He most likely has a recurrent stricture.  Cystoscopy at the time of his urethral dilation did show lateral lobe enlargement.  Will start on tamsulosin 0.4 mg daily to see if this improves his voiding symptoms.  He will follow-up for cystoscopy.  PVR by bladder scan today was 86 mL.  It was 114 when he saw Dr. Jacqlyn Larsen August 2018.   Abbie Sons, [place] [address] [place], [place] [address] [phone]

## 2018-01-22 ENCOUNTER — Ambulatory Visit: Payer: Medicare Other | Admitting: Anesthesiology

## 2018-01-22 ENCOUNTER — Encounter: Payer: Self-pay | Admitting: Anesthesiology

## 2018-01-22 ENCOUNTER — Ambulatory Visit
Admission: RE | Admit: 2018-01-22 | Discharge: 2018-01-22 | Disposition: A | Discharge status: Home / Self Care | Payer: Medicare Other | Source: Ambulatory Visit | Attending: Gastroenterology | Admitting: Gastroenterology

## 2018-01-22 ENCOUNTER — Encounter
Admission: RE | Disposition: A | Discharge status: Home / Self Care | Payer: Self-pay | Source: Ambulatory Visit | Attending: Gastroenterology

## 2018-01-22 DIAGNOSIS — I2510 Atherosclerotic heart disease of native coronary artery without angina pectoris: Secondary | ICD-10-CM | POA: Diagnosis not present

## 2018-01-22 DIAGNOSIS — H409 Unspecified glaucoma: Secondary | ICD-10-CM | POA: Diagnosis not present

## 2018-01-22 DIAGNOSIS — G4700 Insomnia, unspecified: Secondary | ICD-10-CM | POA: Insufficient documentation

## 2018-01-22 DIAGNOSIS — E119 Type 2 diabetes mellitus without complications: Secondary | ICD-10-CM | POA: Diagnosis not present

## 2018-01-22 DIAGNOSIS — Z7982 Long term (current) use of aspirin: Secondary | ICD-10-CM | POA: Insufficient documentation

## 2018-01-22 DIAGNOSIS — E7800 Pure hypercholesterolemia, unspecified: Secondary | ICD-10-CM | POA: Diagnosis not present

## 2018-01-22 DIAGNOSIS — Z86010 Personal history of colonic polyps: Secondary | ICD-10-CM | POA: Diagnosis not present

## 2018-01-22 DIAGNOSIS — Z87891 Personal history of nicotine dependence: Secondary | ICD-10-CM | POA: Insufficient documentation

## 2018-01-22 DIAGNOSIS — Z7984 Long term (current) use of oral hypoglycemic drugs: Secondary | ICD-10-CM | POA: Insufficient documentation

## 2018-01-22 DIAGNOSIS — J449 Chronic obstructive pulmonary disease, unspecified: Secondary | ICD-10-CM | POA: Diagnosis not present

## 2018-01-22 DIAGNOSIS — Z48815 Encounter for surgical aftercare following surgery on the digestive system: Secondary | ICD-10-CM | POA: Insufficient documentation

## 2018-01-22 DIAGNOSIS — K5730 Diverticulosis of large intestine without perforation or abscess without bleeding: Secondary | ICD-10-CM | POA: Insufficient documentation

## 2018-01-22 DIAGNOSIS — K640 First degree hemorrhoids: Secondary | ICD-10-CM | POA: Insufficient documentation

## 2018-01-22 DIAGNOSIS — M1990 Unspecified osteoarthritis, unspecified site: Secondary | ICD-10-CM | POA: Diagnosis not present

## 2018-01-22 DIAGNOSIS — I10 Essential (primary) hypertension: Secondary | ICD-10-CM | POA: Diagnosis not present

## 2018-01-22 HISTORY — PX: COLONOSCOPY WITH PROPOFOL: SHX5780

## 2018-01-22 HISTORY — DX: Benign neoplasm of colon, unspecified: D12.6

## 2018-01-22 LAB — GLUCOSE, CAPILLARY: GLUCOSE-CAPILLARY: 116 mg/dL — AB (ref 70–99)

## 2018-01-22 SURGERY — COLONOSCOPY WITH PROPOFOL
Anesthesia: General

## 2018-01-22 MED ORDER — PROPOFOL 10 MG/ML IV BOLUS
INTRAVENOUS | Status: DC | PRN
  Administered 2018-01-22: 30 mg via INTRAVENOUS

## 2018-01-22 MED ORDER — SODIUM CHLORIDE 0.9 % IV SOLN
INTRAVENOUS | Status: DC
  Administered 2018-01-22: 1000 mL via INTRAVENOUS

## 2018-01-22 MED ORDER — PROPOFOL 500 MG/50ML IV EMUL
INTRAVENOUS | Status: AC
  Filled 2018-01-22: qty 50

## 2018-01-22 MED ORDER — LACTATED RINGERS IV SOLN
INTRAVENOUS | Status: DC | PRN
  Administered 2018-01-22: 08:00:00 via INTRAVENOUS

## 2018-01-22 MED ORDER — PROPOFOL 500 MG/50ML IV EMUL
INTRAVENOUS | Status: DC | PRN
  Administered 2018-01-22: 120 ug/kg/min via INTRAVENOUS

## 2018-01-22 NOTE — Anesthesia Preprocedure Evaluation (Signed)
Anesthesia Evaluation  Patient identified by MRN, date of birth, ID band Patient awake    Reviewed: Allergy & Precautions, NPO status , Patient's Chart, lab work & pertinent test results  Airway Mallampati: II  TM Distance: >3 FB     Dental   Pulmonary COPD, former smoker,    Pulmonary exam normal        Cardiovascular hypertension, Pt. on medications + CAD  Normal cardiovascular exam     Neuro/Psych negative neurological ROS  negative psych ROS   GI/Hepatic   Endo/Other  diabetes, Well Controlled  Renal/GU negative Renal ROS     Musculoskeletal  (+) Arthritis ,   Abdominal Normal abdominal exam  (+)   Peds  Hematology  (+) Blood dyscrasia, ,   Anesthesia Other Findings Past Medical History: No date: Adenoma of colon No date: Arthritis No date: Celiac artery dissection (H[facility]) No date: COPD (chronic obstructive pulmonary disease) ([facility]) No date: Coronary artery disease No date: Diabetes mellitus without complication (H[facility]) No date: Diverticulosis No date: Glaucoma No date: Hearing impairment No date: Hypercholesteremia No date: Hypertension No date: Insomnia No date: Meniere syndrome No date: Thrombocytopenia ([place]) No date: Vertigo  Reproductive/Obstetrics                             Anesthesia Physical Anesthesia Plan  ASA: III  Anesthesia Plan: General   Post-op Pain Management:    Induction: Intravenous  PONV Risk Score and Plan:   Airway Management Planned: Nasal Cannula  Additional Equipment:   Intra-op Plan:   Post-operative Plan:   Informed Consent: I have reviewed the patients History and Physical, chart, labs and discussed the procedure including the risks, benefits and alternatives for the proposed anesthesia with the patient or authorized representative who has indicated his/her understanding and acceptance.   Dental advisory given  Plan Discussed with:  CRNA and Surgeon  Anesthesia Plan Comments:         Anesthesia Quick Evaluation

## 2018-01-22 NOTE — Anesthesia Post-op Follow-up Note (Signed)
Anesthesia QCDR form completed.        

## 2018-01-22 NOTE — H&P (Signed)
Outpatient short stay form Pre-procedure 01/22/2018 7:17 AM Lollie Sails MD  Primary Physician: Harrel Lemon, MD  Reason for visit: Colonoscopy  History of present illness: Patient is a 65 old male with a personal history of colon polyps.  He is presenting today for colonoscopy in this regard.  He tolerated his prep well.  He takes no aspirin or blood thinning agent with the exception of 81 mg aspirin that was held.  His last colonoscopy was on 03/13/2013 no polyps at that time.    Current Facility-Administered Medications:  .  0.9 %  sodium chloride infusion, , Intravenous, Continuous, Lollie Sails, MD, Last Rate: 20 mL/hr at 01/22/18 0702, 1,000 mL at 01/22/18 0702  Medications Prior to Admission  Medication Sig Dispense Refill Last Dose  . albuterol (PROVENTIL HFA;VENTOLIN HFA) 108 (90 Base) MCG/ACT inhaler Inhale 2 puffs into the lungs every 6 (six) hours as needed for wheezing or shortness of breath.   Past Week at Unknown time  . aspirin EC 81 MG tablet Take 81 mg by mouth daily.   Past Week at Unknown time  . brimonidine (ALPHAGAN) 0.2 % ophthalmic solution Place 1 drop into both eyes 2 (two) times daily.  6 Past Week at Unknown time  . Co-Enzyme Q10 200 MG CAPS Take by mouth.   Past Week at Unknown time  . dorzolamide-timolol (COSOPT) 22.3-6.8 MG/ML ophthalmic solution 1 drop 2 (two) times daily.   Past Week at Unknown time  . FLUZONE HIGH-DOSE 0.5 ML SUSY Inject 0.5 mLs into the muscle once.  0 Past Week at Unknown time  . Glucosamine HCl 1500 MG TABS Take by mouth.   Past Week at Unknown time  . Glucosamine-Chondroitin 250-200 MG CAPS Take by mouth.   Past Week at Unknown time  . latanoprost (XALATAN) 0.005 % ophthalmic solution Place 1 drop into both eyes at bedtime.    Past Week at Unknown time  . lisinopril (PRINIVIL,ZESTRIL) 10 MG tablet Take 10 mg by mouth daily.   Past Week at Unknown time  . lovastatin (MEVACOR) 20 MG tablet Take by mouth.   Past Week at Unknown  time  . Melatonin 10 MG TABS Take by mouth.   Past Week at Unknown time  . metFORMIN (GLUCOPHAGE) 500 MG tablet Take 500 mg by mouth 2 (two) times daily with a meal.   01/21/2018 at Unknown time  . polyethylene glycol (MIRALAX / GLYCOLAX) packet Take 17 g by mouth daily.   Past Week at Unknown time  . sildenafil (REVATIO) 20 MG tablet Take 20 mg by mouth 3 (three) times daily.   Past Week at Unknown time  . tamsulosin (FLOMAX) 0.4 MG CAPS capsule Take 1 capsule (0.4 mg total) by mouth daily after supper. 30 capsule 1 Past Week at Unknown time  . timolol (TIMOPTIC) 0.5 % ophthalmic solution Place 1 drop into the left eye 2 (two) times daily.  1 Past Week at Unknown time  . traZODone (DESYREL) 50 MG tablet Take 50 mg by mouth at bedtime.   Past Week at Unknown time  . umeclidinium-vilanterol (ANORO ELLIPTA) 62.5-25 MCG/INH AEPB Inhale 1 puff into the lungs daily.   Past Week at Unknown time     Allergies  Allergen Reactions  . Lovastatin     Other reaction(s): Muscle Pain With higher doses     Past Medical History:  Diagnosis Date  . Adenoma of colon   . Arthritis   . 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  COPD (chronic obstructive pulmonary disease) ([place])   . Coronary artery disease   . Diabetes mellitus without complication ([place])   . Diverticulosis   . Glaucoma   . Hearing impairment   . Hypercholesteremia   . Hypertension   . Insomnia   . Meniere syndrome   . Thrombocytopenia ([place])   . Vertigo     Review of systems:      Physical Exam    Heart and lungs: Regular rate and rhythm without rub or gallop, lungs are bilaterally clear.    HEENT: Normocephalic atraumatic eyes are anicteric    Other:    Pertinant exam for procedure: Soft nontender nondistended bowel sounds positive normoactive    Planned proceedures: Colonoscopy and indicated procedures. I have discussed the risks benefits and complications of procedures to include not limited to bleeding, infection,  perforation and the risk of sedation and the patient wishes to proceed.    Lollie Sails, MD Gastroenterology 01/22/2018  7:17 AM

## 2018-01-22 NOTE — Anesthesia Postprocedure Evaluation (Signed)
Anesthesia Post Note  Patient: Kirk Russell  Procedure(s) Performed: COLONOSCOPY WITH PROPOFOL (N/A )  Patient location during evaluation: Endoscopy Anesthesia Type: General Level of consciousness: awake and alert and oriented Pain management: pain level controlled Vital Signs Assessment: post-procedure vital signs reviewed and stable Respiratory status: spontaneous breathing Cardiovascular status: blood pressure returned to baseline Anesthetic complications: no     Last Vitals:  Vitals:   01/22/18 0649 01/22/18 0820  BP: 134/88 102/67  Pulse: 66 65  Resp: 20 20  Temp: (!) 36.1 C (!) 36.1 C  SpO2: 96% 98%    Last Pain:  Vitals:   01/22/18 0820  TempSrc: Tympanic  PainSc:                  [name]

## 2018-01-22 NOTE — Transfer of Care (Signed)
Immediate Anesthesia Transfer of Care Note  Patient: Kirk Russell  Procedure(s) Performed: COLONOSCOPY WITH PROPOFOL (N/A )  Patient Location: PACU and Endoscopy Unit  Anesthesia Type:General  Level of Consciousness: awake, alert  and oriented  Airway & Oxygen Therapy: Patient Spontanous Breathing  Post-op Assessment: Report given to RN  Post vital signs: stable  Last Vitals:  Vitals Value Taken Time  BP    Temp    Pulse 66 01/22/2018  8:24 AM  Resp 13 01/22/2018  8:24 AM  SpO2 97 % 01/22/2018  8:24 AM  Vitals shown include unvalidated device data.  Last Pain:  Vitals:   01/22/18 0649  TempSrc: Tympanic  PainSc: 0-No pain         Complications: No apparent anesthesia complications

## 2018-01-22 NOTE — Op Note (Signed)
[facility] Gastroenterology Patient Name: Kirk Russell Procedure Date: 01/22/2018 7:16 AM MRN: [id number] Account #: [id number] Date of Birth: [birth year] Admit Type: Outpatient Age: 72 Room: [facility] ENDO ROOM 3 Gender: Male Note Status: Finalized Procedure:            Colonoscopy Indications:          Personal history of colonic polyps Providers:            Lollie Sails, MD Referring MD:         Baxter Hire, MD (Referring MD) Medicines:            Monitored Anesthesia Care Complications:        No immediate complications. Procedure:            Pre-Anesthesia Assessment:                       - ASA Grade Assessment: III - A patient with severe                        systemic disease.                       After obtaining informed consent, the colonoscope was                        passed under direct vision. Throughout the procedure,                        the patient's blood pressure, pulse, and oxygen                        saturations were monitored continuously. The                        Colonoscope was introduced through the anus and                        advanced to the the cecum, identified by appendiceal                        orifice and ileocecal valve. The colonoscopy was                        performed with moderate difficulty due to significant                        looping. Successful completion of the procedure was                        aided by changing the patient to a supine position,                        changing the patient to a prone position, using manual                        pressure and withdrawing and reinserting the scope. The                        patient tolerated the procedure well. The quality of  the bowel preparation was good. Findings:      Multiple medium and small-mouthed diverticula were found in the sigmoid       colon and distal descending colon.      Non-bleeding internal  hemorrhoids were found during anoscopy. The       hemorrhoids were small and Grade I (internal hemorrhoids that do not       prolapse).      No additional abnormalities were found on retroflexion. Impression:           - Diverticulosis in the sigmoid colon and in the distal                        descending colon.                       - Non-bleeding internal hemorrhoids.                       - No specimens collected. Recommendation:       - Discharge patient to home.                       - Advance diet as tolerated.                       - Repeat colonoscopy in 5 years for screening purposes. Procedure Code(s):    --- Professional ---                       [phone], Colonoscopy, flexible; diagnostic, including                        collection of specimen(s) by brushing or washing, when                        performed (separate procedure) Diagnosis Code(s):    --- Professional ---                       K64.0, First degree hemorrhoids                       Z86.010, Personal history of colonic polyps                       K57.30, Diverticulosis of large intestine without                        perforation or abscess without bleeding CPT copyright 2017 American Medical Association. All rights reserved. The codes documented in this report are preliminary and upon coder review may  be revised to meet current compliance requirements. Lollie Sails, MD 01/22/2018 8:18:52 AM This report has been signed electronically. Number of Addenda: 0 Note Initiated On: 01/22/2018 7:16 AM Scope Withdrawal Time: 0 hours 5 minutes 36 seconds  Total Procedure Duration: 0 hours 31 minutes 51 seconds       [facility]

## 2018-01-23 ENCOUNTER — Encounter: Payer: Self-pay | Admitting: Gastroenterology

## 2018-01-25 ENCOUNTER — Ambulatory Visit: Payer: Medicare Other | Admitting: Urology

## 2018-03-27 DIAGNOSIS — R0602 Shortness of breath: Secondary | ICD-10-CM | POA: Insufficient documentation

## 2018-08-13 DIAGNOSIS — M545 Low back pain, unspecified: Secondary | ICD-10-CM | POA: Insufficient documentation

## 2018-08-26 ENCOUNTER — Telehealth: Payer: Self-pay

## 2018-08-26 ENCOUNTER — Telehealth: Payer: Self-pay | Admitting: Urology

## 2018-08-26 ENCOUNTER — Ambulatory Visit: Payer: Medicare Other

## 2018-08-26 ENCOUNTER — Other Ambulatory Visit: Payer: Self-pay

## 2018-08-26 DIAGNOSIS — N400 Enlarged prostate without lower urinary tract symptoms: Secondary | ICD-10-CM

## 2018-08-26 LAB — MICROSCOPIC EXAMINATION
Bacteria, UA: NONE SEEN
Epithelial Cells (non renal): NONE SEEN /hpf (ref 0–10)
RBC: NONE SEEN /hpf (ref 0–2)
WBC, UA: NONE SEEN /hpf (ref 0–5)

## 2018-08-26 LAB — URINALYSIS, COMPLETE
Bilirubin, UA: NEGATIVE
Glucose, UA: NEGATIVE
Ketones, UA: NEGATIVE
Leukocytes,UA: NEGATIVE
Nitrite, UA: NEGATIVE
Protein,UA: NEGATIVE
RBC, UA: NEGATIVE
Specific Gravity, UA: 1.01 (ref 1.005–1.030)
Urobilinogen, Ur: 0.2 mg/dL (ref 0.2–1.0)
pH, UA: 5 (ref 5.0–7.5)

## 2018-08-26 NOTE — Telephone Encounter (Signed)
Pt states that he his having right testicle pain, started last week. He also states that when he urinates it starts and stops. Please advise.

## 2018-08-26 NOTE — Telephone Encounter (Signed)
Should patient come in for a UA and PVR? Or possible provider/virtual visit

## 2018-08-26 NOTE — Telephone Encounter (Signed)
Spoke with patient and he will come in today for a nurse visit

## 2018-08-26 NOTE — Progress Notes (Signed)
Patient present today complaining of dysuria and trouble emptying. UA today is clear and PVR -196ml Per Dr. Bernardo Heater patient to follow up for cysto in June  Patient notified and scheduled

## 2018-08-26 NOTE — Telephone Encounter (Signed)
I saw him back in September and recommended a cystoscopy which was scheduled for October but he canceled.  Would start out with a urinalysis, possible culture and PVR.

## 2018-08-28 ENCOUNTER — Ambulatory Visit: Payer: Medicare Other

## 2018-09-02 ENCOUNTER — Other Ambulatory Visit (HOSPITAL_COMMUNITY): Payer: Self-pay | Admitting: Orthopedic Surgery

## 2018-09-02 ENCOUNTER — Other Ambulatory Visit: Payer: Self-pay | Admitting: Orthopedic Surgery

## 2018-09-02 DIAGNOSIS — G8929 Other chronic pain: Secondary | ICD-10-CM

## 2018-09-10 ENCOUNTER — Ambulatory Visit
Admission: RE | Admit: 2018-09-10 | Discharge: 2018-09-10 | Disposition: A | Discharge status: Home / Self Care | Payer: Medicare Other | Source: Ambulatory Visit | Attending: Orthopedic Surgery | Admitting: Orthopedic Surgery

## 2018-09-10 ENCOUNTER — Other Ambulatory Visit: Payer: Self-pay

## 2018-09-10 DIAGNOSIS — M5441 Lumbago with sciatica, right side: Secondary | ICD-10-CM | POA: Insufficient documentation

## 2018-09-10 DIAGNOSIS — G8929 Other chronic pain: Secondary | ICD-10-CM | POA: Insufficient documentation

## 2018-10-08 ENCOUNTER — Other Ambulatory Visit: Payer: Self-pay

## 2018-10-08 ENCOUNTER — Encounter: Payer: Self-pay | Admitting: Urology

## 2018-10-08 ENCOUNTER — Ambulatory Visit (INDEPENDENT_AMBULATORY_CARE_PROVIDER_SITE_OTHER): Payer: Medicare Other | Admitting: Urology

## 2018-10-08 VITALS — BP 129/81 | HR 78 | Ht 69.0 in | Wt 204.4 lb

## 2018-10-08 DIAGNOSIS — N400 Enlarged prostate without lower urinary tract symptoms: Secondary | ICD-10-CM | POA: Diagnosis not present

## 2018-10-08 DIAGNOSIS — R3912 Poor urinary stream: Secondary | ICD-10-CM

## 2018-10-08 MED ORDER — ALFUZOSIN HCL ER 10 MG PO TB24: 10.0000 mg | ORAL_TABLET | Freq: Every day | ORAL | 2 refills | Status: DC

## 2018-10-08 MED ORDER — LIDOCAINE HCL URETHRAL/MUCOSAL 2 % EX GEL
1.0000 "application " | Freq: Once | CUTANEOUS | Status: AC
  Administered 2018-10-08: 1 via URETHRAL

## 2018-10-08 NOTE — Progress Notes (Signed)
   10/09/18  CC:  Chief Complaint  Patient presents with  . Cysto    HPI: 73 year old male seen by me in September 2019.  He has a history of a bulbar urethral stricture and underwent cystoscopy with dilation May 2018.  He was on self-catheterization however discontinued secondary to recurrent UTIs.  At his September visit he had noted decreased force and caliber of his urinary stream.  A cystoscopy was recommended which was scheduled in October however the patient canceled.  He called recently complaining of decreased stream, sensation of incomplete emptying.  PVR was 130 mL.  Blood pressure 129/81, pulse 78, height 5\' 9"  (1.753 m), weight 204 lb 6.4 oz (92.7 kg). NED. A&Ox3.   No respiratory distress   Abd soft, NT, ND Normal phallus with bilateral descended testicles  Cystoscopy Procedure Note  Patient identification was confirmed, informed consent was obtained, and patient was prepped using Betadine solution.  Lidocaine jelly was administered per urethral meatus.     Pre-Procedure: - Inspection reveals a normal caliber ureteral meatus.  Procedure: The flexible cystoscope was introduced without difficulty - No urethral strictures/lesions are present. - Moderate lateral lobe enlargement prostate.  The prostatic urethra was very tight but no stricture or bladder neck contracture - Bilateral ureteral orifices identified - Bladder mucosa  reveals no ulcers, tumors, or lesions - No bladder stones -Moderate trabeculation with scattered cellules and small diverticula  Retroflexion shows no abnormalities   Post-Procedure: - Patient tolerated the procedure well  Assessment/ Plan: -No evidence of recurrent urethral stricture on cystoscopy.  Symptoms most likely related to BPH  -Based on his pharmacy formulary alfuzosin was sent to pharmacy.  -Follow-up 6-8 weeks for symptom reassessment/IPSS   Abbie Sons, MD

## 2018-10-09 LAB — MICROSCOPIC EXAMINATION
Bacteria, UA: NONE SEEN
Epithelial Cells (non renal): NONE SEEN /hpf (ref 0–10)
RBC: NONE SEEN /hpf (ref 0–2)

## 2018-10-09 LAB — URINALYSIS, COMPLETE
Bilirubin, UA: NEGATIVE
Ketones, UA: NEGATIVE
Leukocytes,UA: NEGATIVE
Nitrite, UA: NEGATIVE
Protein,UA: NEGATIVE
RBC, UA: NEGATIVE
Specific Gravity, UA: 1.015 (ref 1.005–1.030)
Urobilinogen, Ur: 0.2 mg/dL (ref 0.2–1.0)
pH, UA: 6.5 (ref 5.0–7.5)

## 2018-11-05 ENCOUNTER — Other Ambulatory Visit: Payer: Self-pay | Admitting: Neurosurgery

## 2018-11-14 NOTE — Telephone Encounter (Signed)
error 

## 2018-11-18 ENCOUNTER — Ambulatory Visit: Admit: 2018-11-18 | Payer: BLUE CROSS/BLUE SHIELD | Admitting: Neurosurgery

## 2018-11-18 SURGERY — HEMI-MICRODISCECTOMY LUMBAR LAMINECTOMY LEVEL 1
Anesthesia: General

## 2018-12-10 ENCOUNTER — Ambulatory Visit: Payer: Medicare Other | Admitting: Urology

## 2019-01-10 ENCOUNTER — Encounter: Payer: Self-pay | Admitting: Urology

## 2019-01-10 ENCOUNTER — Ambulatory Visit (INDEPENDENT_AMBULATORY_CARE_PROVIDER_SITE_OTHER): Payer: Medicare Other | Admitting: Urology

## 2019-01-10 ENCOUNTER — Other Ambulatory Visit: Payer: Self-pay

## 2019-01-10 VITALS — BP 129/85 | HR 73 | Ht 69.0 in | Wt 209.2 lb

## 2019-01-10 DIAGNOSIS — N400 Enlarged prostate without lower urinary tract symptoms: Secondary | ICD-10-CM

## 2019-01-10 DIAGNOSIS — N401 Enlarged prostate with lower urinary tract symptoms: Secondary | ICD-10-CM | POA: Diagnosis not present

## 2019-01-10 DIAGNOSIS — R3912 Poor urinary stream: Secondary | ICD-10-CM | POA: Diagnosis not present

## 2019-01-10 LAB — BLADDER SCAN AMB NON-IMAGING

## 2019-01-10 MED ORDER — ALFUZOSIN HCL ER 10 MG PO TB24: 10.0000 mg | ORAL_TABLET | Freq: Every day | ORAL | 3 refills | Status: DC

## 2019-01-10 NOTE — Progress Notes (Signed)
01/10/2019 9:03 AM   Kirk Russell Jun 09, 1945 [id number]  Referring provider: Baxter Hire, MD [place],  [place] [address]  Chief Complaint  Patient presents with  . Benign Prostatic Hypertrophy    HPI: [name] presents for follow-up of lower urinary tract symptoms and incomplete bladder emptying.  He has a history of recurrent urethral stricture disease.  Cystoscopy performed June 2020 showed no stricture and lateral lobe enlargement.  He was started on alfuzosin and does feel his voiding symptoms have improved especially his nocturia.  He is currently satisfied with his voiding pattern and desires to continue this medication.   PMH: Past Medical History:  Diagnosis Date  . Adenoma of colon   . Arthritis   . 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 COPD (chronic obstructive pulmonary disease) ([place])   . Coronary artery disease   . Diabetes mellitus without complication ([place])   . Diverticulosis   . Glaucoma   . Hearing impairment   . Hypercholesteremia   . Hypertension   . Insomnia   . Meniere syndrome   . Thrombocytopenia ([place])   . Vertigo     Surgical History: Past Surgical History:  Procedure Laterality Date  . ABDOMINAL SURGERY    . APPENDECTOMY    . COLONOSCOPY WITH PROPOFOL N/A 01/22/2018   Procedure: COLONOSCOPY WITH PROPOFOL;  Surgeon: Lollie Sails, MD;  Location: [facility] ENDOSCOPY;  Service: Endoscopy;  Laterality: N/A;  . CORONARY ANGIOPLASTY WITH STENT PLACEMENT      Home Medications:  Allergies as of 01/10/2019      Reactions   Lovastatin    Other reaction(s): Muscle Pain With higher doses      Medication List       Accurate as of January 10, 2019  9:03 AM. If you have any questions, ask your nurse or doctor.        alfuzosin 10 MG 24 hr tablet Commonly known as: UROXATRAL Take 1 tablet (10 mg total) by mouth daily with breakfast.   Anoro Ellipta 62.5-25 MCG/INH Aepb Generic drug: umeclidinium-vilanterol  Inhale 1 puff into the lungs daily.   aspirin EC 81 MG tablet Take 81 mg by mouth daily.   brimonidine 0.2 % ophthalmic solution Commonly known as: ALPHAGAN Place 1 drop into both eyes 2 (two) times daily.   celecoxib 200 MG capsule Commonly known as: CELEBREX celecoxib 200 mg capsule   CHONDROITIN SULFATE PO Take 250 mg by mouth daily.   clobetasol ointment 0.05 % Commonly known as: TEMOVATE clobetasol 0.05 % topical ointment   Co-Enzyme Q10 200 MG Caps Take by mouth.   cyclobenzaprine 10 MG tablet Commonly known as: FLEXERIL TK 1 T PO TID   dorzolamide-timolol 22.3-6.8 MG/ML ophthalmic solution Commonly known as: COSOPT 1 drop 2 (two) times daily.   Glucosamine HCl 1500 MG Tabs Take by mouth.   Glucosamine-Chondroitin 250-200 MG Caps Take by mouth.   hydrocortisone 2.5 % cream Apply topically.   ketoconazole 2 % shampoo Commonly known as: NIZORAL ketoconazole 2 % shampoo  APP TOPICALLY AA 2 TIMES A WK   lisinopril 10 MG tablet Commonly known as: ZESTRIL Take 10 mg by mouth daily.   lovastatin 20 MG tablet Commonly known as: MEVACOR Take by mouth.   meloxicam 7.5 MG tablet Commonly known as: MOBIC TK 1 T PO BID   metFORMIN 500 MG tablet Commonly known as: GLUCOPHAGE Take 500 mg by mouth 2 (two) times daily with a meal.   nystatin  cream Commonly known as: MYCOSTATIN nystatin 100,000 unit/gram topical cream   polyethylene glycol 17 g packet Commonly known as: MIRALAX / GLYCOLAX Take 17 g by mouth daily.   sildenafil 100 MG tablet Commonly known as: VIAGRA Take by mouth.   sildenafil 20 MG tablet Commonly known as: REVATIO Take 20 mg by mouth 3 (three) times daily.   timolol 0.5 % ophthalmic solution Commonly known as: TIMOPTIC Place 1 drop into the left eye 2 (two) times daily.   traMADol 50 MG tablet Commonly known as: ULTRAM tramadol 50 mg tablet   traZODone 50 MG tablet Commonly known as: DESYREL Take 50 mg by mouth at bedtime.    triamcinolone lotion 0.1 % Commonly known as: KENALOG triamcinolone acetonide 0.1 % lotion       Allergies:  Allergies  Allergen Reactions  . Lovastatin     Other reaction(s): Muscle Pain With higher doses    Family History: Family History  Problem Relation Age of Onset  . Breast cancer Mother   . Prostate cancer Neg Hx   . Kidney cancer Neg Hx   . Bladder Cancer Neg Hx     Social History:  reports that he has quit smoking. He has quit using smokeless tobacco. He reports current alcohol use of about 8.0 standard drinks of alcohol per week. He reports that he does not use drugs.  ROS: UROLOGY Frequent Urination?: Yes Hard to postpone urination?: No Burning/pain with urination?: No Get up at night to urinate?: Yes Leakage of urine?: No Urine stream starts and stops?: No Trouble starting stream?: No Do you have to strain to urinate?: No Blood in urine?: No Urinary tract infection?: No Sexually transmitted disease?: No Injury to kidneys or bladder?: No Painful intercourse?: No Weak stream?: No Erection problems?: Yes Penile pain?: No  Gastrointestinal Nausea?: No Vomiting?: No Indigestion/heartburn?: No Diarrhea?: No Constipation?: No  Constitutional Fever: No Night sweats?: No Weight loss?: No Fatigue?: No  Skin Skin rash/lesions?: No Itching?: No  Eyes Blurred vision?: No Double vision?: No  Ears/Nose/Throat Sore throat?: No Sinus problems?: No  Hematologic/Lymphatic Swollen glands?: No Easy bruising?: No  Cardiovascular Leg swelling?: No Chest pain?: No  Respiratory Cough?: No Shortness of breath?: No  Endocrine Excessive thirst?: No  Musculoskeletal Back pain?: Yes Joint pain?: No  Neurological Headaches?: No Dizziness?: No  Psychologic Depression?: No Anxiety?: No  Physical Exam: BP 129/85 (BP Location: Left Arm, Patient Position: Sitting, Cuff Size: Normal)   Pulse 73   Ht 5\' 9"  (1.753 m)   Wt 209 lb 3.2 oz  (94.9 kg)   BMI 30.89 kg/m   Constitutional:  Alert and oriented, No acute distress. HEENT: [place] AT, moist mucus membranes.  Trachea midline, no masses. Cardiovascular: No clubbing, cyanosis, or edema. Respiratory: Normal respiratory effort, no increased work of breathing. Skin: No rashes, bruises or suspicious lesions. Neurologic: Grossly intact, no focal deficits, moving all 4 extremities. Psychiatric: Normal mood and affect.   Assessment & Plan:    - BPH with lower urinary tract symptoms Doing well on alfuzosin.  PVR by bladder scan today was 116 mL.  Rx refill was sent to pharmacy.  Follow-up annually or as needed for worsening voiding symptoms.  Abbie Sons, [place] [address], [place],  [address] [phone]

## 2019-11-05 ENCOUNTER — Ambulatory Visit: Admit: 2019-11-05 | Payer: BLUE CROSS/BLUE SHIELD | Admitting: Ophthalmology

## 2019-11-05 SURGERY — PHACOEMULSIFICATION, CATARACT, WITH IOL INSERTION
Anesthesia: Topical | Laterality: Right

## 2019-11-24 ENCOUNTER — Other Ambulatory Visit: Payer: Self-pay | Admitting: Ophthalmology

## 2020-01-11 NOTE — Progress Notes (Signed)
01/12/2020 10:18 AM   Clearence Ped [name] 06-Jun-1945 [id number]  Referring provider: Baxter Hire, MD [place],  [place] [address] Chief Complaint  Patient presents with  . Benign Prostatic Hypertrophy    Urologic history: 1.  BPH with incomplete bladder emptying -Alfuzosin daily -Baseline PVR <150 mL  2.  History urethral stricture disease -Cystoscopy 09/2018 with moderate lateral lobe enlargement; no stricture identified  HPI: Kirk Russell is a 74 y.o. male who returns for an annual follow up of BPH with LUTS.  -Cystoscopy on 09/2018 showed no stricture and lateral lobe enlargement. -During last visit, patient was started on alfuzosin and reported symptomatic improvement.  -Today his is doing well and remains on alfuzosin.  -He has nocturia x 2-3 and urgency and frequency.  -Denies difficulty urinating.  -Reports borderline sleep apnea, not on CPAP  -Complaining of worsening ED and wanted to look at other options, primarily other oral medications   PMH: Past Medical History:  Diagnosis Date  . Adenoma of colon   . Arthritis   . 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 COPD (chronic obstructive pulmonary disease) ([place])   . Coronary artery disease   . Diabetes mellitus without complication ([place])   . Diverticulosis   . Glaucoma   . Hearing impairment   . Hypercholesteremia   . Hypertension   . Insomnia   . Meniere syndrome   . Thrombocytopenia ([place])   . Vertigo     Surgical History: Past Surgical History:  Procedure Laterality Date  . ABDOMINAL SURGERY    . APPENDECTOMY    . COLONOSCOPY WITH PROPOFOL N/A 01/22/2018   Procedure: COLONOSCOPY WITH PROPOFOL;  Surgeon: Lollie Sails, MD;  Location: [facility] ENDOSCOPY;  Service: Endoscopy;  Laterality: N/A;  . CORONARY ANGIOPLASTY WITH STENT PLACEMENT      Home Medications:  Allergies as of 01/12/2020      Reactions   Lovastatin    Other reaction(s): Muscle Pain With higher  doses      Medication List       Accurate as of January 12, 2020 10:18 AM. If you have any questions, ask your nurse or doctor.        alfuzosin 10 MG 24 hr tablet Commonly known as: UROXATRAL Take 1 tablet (10 mg total) by mouth daily with breakfast.   Anoro Ellipta 62.5-25 MCG/INH Aepb Generic drug: umeclidinium-vilanterol Inhale 1 puff into the lungs daily.   aspirin EC 81 MG tablet Take 81 mg by mouth daily.   brimonidine 0.2 % ophthalmic solution Commonly known as: ALPHAGAN Place 1 drop into both eyes 2 (two) times daily.   celecoxib 200 MG capsule Commonly known as: CELEBREX celecoxib 200 mg capsule   CHONDROITIN SULFATE PO Take 250 mg by mouth daily.   clobetasol ointment 0.05 % Commonly known as: TEMOVATE clobetasol 0.05 % topical ointment   Co-Enzyme Q10 200 MG Caps Take by mouth.   cyclobenzaprine 10 MG tablet Commonly known as: FLEXERIL TK 1 T PO TID   dorzolamide-timolol 22.3-6.8 MG/ML ophthalmic solution Commonly known as: COSOPT 1 drop 2 (two) times daily.   Glucosamine HCl 1500 MG Tabs Take by mouth.   Glucosamine-Chondroitin 250-200 MG Caps Take by mouth.   hydrocortisone 2.5 % cream Apply topically.   ketoconazole 2 % shampoo Commonly known as: NIZORAL ketoconazole 2 % shampoo  APP TOPICALLY AA 2 TIMES A WK   lisinopril 10 MG tablet Commonly known as: ZESTRIL Take 10 mg by mouth daily.  lovastatin 20 MG tablet Commonly known as: MEVACOR Take by mouth.   meloxicam 7.5 MG tablet Commonly known as: MOBIC TK 1 T PO BID   metFORMIN 500 MG tablet Commonly known as: GLUCOPHAGE Take 500 mg by mouth 2 (two) times daily with a meal.   nystatin cream Commonly known as: MYCOSTATIN nystatin 100,000 unit/gram topical cream   polyethylene glycol 17 g packet Commonly known as: MIRALAX / GLYCOLAX Take 17 g by mouth daily.   sildenafil 100 MG tablet Commonly known as: VIAGRA Take by mouth.   sildenafil 20 MG tablet Commonly  known as: REVATIO Take 20 mg by mouth 3 (three) times daily.   timolol 0.5 % ophthalmic solution Commonly known as: TIMOPTIC Place 1 drop into the left eye 2 (two) times daily.   traMADol 50 MG tablet Commonly known as: ULTRAM tramadol 50 mg tablet   traZODone 50 MG tablet Commonly known as: DESYREL Take 50 mg by mouth at bedtime.   triamcinolone lotion 0.1 % Commonly known as: KENALOG triamcinolone acetonide 0.1 % lotion       Allergies:  Allergies  Allergen Reactions  . Lovastatin     Other reaction(s): Muscle Pain With higher doses    Family History: Family History  Problem Relation Age of Onset  . Breast cancer Mother   . Prostate cancer Neg Hx   . Kidney cancer Neg Hx   . Bladder Cancer Neg Hx     Social History:  reports that he has quit smoking. He has quit using smokeless tobacco. He reports current alcohol use of about 8.0 standard drinks of alcohol per week. He reports that he does not use drugs.   Physical Exam: BP 106/71   Pulse 87   Ht 5\' 9"  (1.753 m)   Wt 210 lb (95.3 kg)   BMI 31.01 kg/m   Constitutional:  Alert and oriented, No acute distress. HEENT: [place] AT, moist mucus membranes.  Trachea midline, no masses. Cardiovascular: No clubbing, cyanosis, or edema. Respiratory: Normal respiratory effort, no increased work of breathing. Skin: No rashes, bruises or suspicious lesions. Neurologic: Grossly intact, no focal deficits, moving all 4 extremities. Psychiatric: Normal mood and affect.  Pertinent image: Results for orders placed or performed in visit on 01/12/20  Bladder Scan (Post Void Residual) in office  Result Value Ref Range   Scan Result 214      Assessment & Plan:    1. BPH with lower urinary tract symptoms Doing well on alfuzosin. Rx refill sent to pharmacy. PVR elevated above baseline at 214 Recheck PVR 3-4 months Continue annual visit  2. Erectile dysfunction  Worsening ED Patient has tried and failed sildenafil 20  mg. -Tadalafil Rx sent.    [facility] [address] [place], [place] [address] [phone]  I, Selena Batten, am acting as a scribe for Dr. Nicki Reaper C. Marcos Peloso,  I have reviewed the above documentation for accuracy and completeness, and I agree with the above.   Abbie Sons, MD

## 2020-01-12 ENCOUNTER — Encounter: Payer: Self-pay | Admitting: Urology

## 2020-01-12 ENCOUNTER — Ambulatory Visit (INDEPENDENT_AMBULATORY_CARE_PROVIDER_SITE_OTHER): Payer: Medicare Other | Admitting: Urology

## 2020-01-12 ENCOUNTER — Other Ambulatory Visit: Payer: Self-pay

## 2020-01-12 VITALS — BP 106/71 | HR 87 | Ht 69.0 in | Wt 210.0 lb

## 2020-01-12 DIAGNOSIS — N401 Enlarged prostate with lower urinary tract symptoms: Secondary | ICD-10-CM

## 2020-01-12 DIAGNOSIS — Z87448 Personal history of other diseases of urinary system: Secondary | ICD-10-CM

## 2020-01-12 DIAGNOSIS — N5201 Erectile dysfunction due to arterial insufficiency: Secondary | ICD-10-CM | POA: Diagnosis not present

## 2020-01-12 LAB — BLADDER SCAN AMB NON-IMAGING: Scan Result: 214

## 2020-01-12 MED ORDER — TADALAFIL 20 MG PO TABS: ORAL_TABLET | ORAL | 0 refills | Status: DC

## 2020-01-14 ENCOUNTER — Encounter: Payer: Self-pay | Admitting: Urology

## 2020-01-14 MED ORDER — ALFUZOSIN HCL ER 10 MG PO TB24: 10.0000 mg | ORAL_TABLET | Freq: Every day | ORAL | 3 refills | Status: DC

## 2020-01-29 ENCOUNTER — Other Ambulatory Visit: Payer: Self-pay

## 2020-01-29 ENCOUNTER — Encounter: Payer: Self-pay | Admitting: Ophthalmology

## 2020-01-30 NOTE — Anesthesia Preprocedure Evaluation (Addendum)
Anesthesia Evaluation  Patient identified by MRN, date of birth, ID band Patient awake    Reviewed: Allergy & Precautions, NPO status , Patient's Chart, lab work & pertinent test results, reviewed documented beta blocker date and time   History of Anesthesia Complications Negative for: history of anesthetic complications  Airway Mallampati: III  TM Distance: >3 FB Neck ROM: Full    Dental   Pulmonary COPD, former smoker,    breath sounds clear to auscultation       Cardiovascular hypertension, (-) angina+ CAD and + Peripheral Vascular Disease (H/o celiac artery dissection)  (-) DOE  Rhythm:Regular Rate:Normal   HLD  Stress test (10/2019): Negative for ischemia  TTE (10/2019): EF 55-60% MILD LVH TRIVIAL MR, PR MILD AR, TR   Neuro/Psych    GI/Hepatic neg GERD  , Diverticulosis   Endo/Other  diabetes  Renal/GU      Musculoskeletal  (+) Arthritis ,   Abdominal   Peds  Hematology  (+) Blood dyscrasia (Thrombocytopenia), ,   Anesthesia Other Findings Colon cancer Skin cancer  Reproductive/Obstetrics                           Anesthesia Physical Anesthesia Plan  ASA: III  Anesthesia Plan: MAC   Post-op Pain Management:    Induction: Intravenous  PONV Risk Score and Plan: 1 and TIVA, Midazolam and Treatment may vary due to age or medical condition  Airway Management Planned: Nasal Cannula  Additional Equipment:   Intra-op Plan:   Post-operative Plan:   Informed Consent: I have reviewed the patients History and Physical, chart, labs and discussed the procedure including the risks, benefits and alternatives for the proposed anesthesia with the patient or authorized representative who has indicated his/her understanding and acceptance.       Plan Discussed with: CRNA and Anesthesiologist  Anesthesia Plan Comments:         Anesthesia Quick Evaluation

## 2020-02-02 ENCOUNTER — Other Ambulatory Visit
Admission: RE | Admit: 2020-02-02 | Discharge: 2020-02-02 | Disposition: A | Discharge status: Home / Self Care | Payer: Medicare Other | Source: Ambulatory Visit | Attending: Ophthalmology | Admitting: Ophthalmology

## 2020-02-02 ENCOUNTER — Other Ambulatory Visit: Payer: Self-pay

## 2020-02-02 DIAGNOSIS — Z01818 Encounter for other preprocedural examination: Secondary | ICD-10-CM | POA: Diagnosis present

## 2020-02-02 DIAGNOSIS — Z20822 Contact with and (suspected) exposure to covid-19: Secondary | ICD-10-CM | POA: Insufficient documentation

## 2020-02-02 LAB — SARS CORONAVIRUS 2 (TAT 6-24 HRS): SARS Coronavirus 2: NEGATIVE

## 2020-02-02 NOTE — Discharge Instructions (Signed)

## 2020-02-04 ENCOUNTER — Encounter
Admission: RE | Disposition: A | Discharge status: Home / Self Care | Payer: Self-pay | Source: Home / Self Care | Attending: Ophthalmology

## 2020-02-04 ENCOUNTER — Ambulatory Visit: Payer: Medicare Other | Admitting: Anesthesiology

## 2020-02-04 ENCOUNTER — Encounter: Payer: Self-pay | Admitting: Ophthalmology

## 2020-02-04 ENCOUNTER — Other Ambulatory Visit: Payer: Self-pay

## 2020-02-04 ENCOUNTER — Ambulatory Visit
Admission: RE | Admit: 2020-02-04 | Discharge: 2020-02-04 | Disposition: A | Discharge status: Home / Self Care | Payer: Medicare Other | Attending: Ophthalmology | Admitting: Ophthalmology

## 2020-02-04 DIAGNOSIS — E119 Type 2 diabetes mellitus without complications: Secondary | ICD-10-CM | POA: Insufficient documentation

## 2020-02-04 DIAGNOSIS — Z87891 Personal history of nicotine dependence: Secondary | ICD-10-CM | POA: Diagnosis not present

## 2020-02-04 DIAGNOSIS — I10 Essential (primary) hypertension: Secondary | ICD-10-CM | POA: Diagnosis not present

## 2020-02-04 DIAGNOSIS — H2511 Age-related nuclear cataract, right eye: Secondary | ICD-10-CM | POA: Diagnosis present

## 2020-02-04 DIAGNOSIS — Z888 Allergy status to other drugs, medicaments and biological substances status: Secondary | ICD-10-CM | POA: Diagnosis not present

## 2020-02-04 DIAGNOSIS — E7800 Pure hypercholesterolemia, unspecified: Secondary | ICD-10-CM | POA: Insufficient documentation

## 2020-02-04 DIAGNOSIS — Z79899 Other long term (current) drug therapy: Secondary | ICD-10-CM | POA: Insufficient documentation

## 2020-02-04 DIAGNOSIS — Z7982 Long term (current) use of aspirin: Secondary | ICD-10-CM | POA: Insufficient documentation

## 2020-02-04 DIAGNOSIS — Z974 Presence of external hearing-aid: Secondary | ICD-10-CM | POA: Diagnosis not present

## 2020-02-04 DIAGNOSIS — J449 Chronic obstructive pulmonary disease, unspecified: Secondary | ICD-10-CM | POA: Diagnosis not present

## 2020-02-04 DIAGNOSIS — Z7984 Long term (current) use of oral hypoglycemic drugs: Secondary | ICD-10-CM | POA: Diagnosis not present

## 2020-02-04 DIAGNOSIS — Z955 Presence of coronary angioplasty implant and graft: Secondary | ICD-10-CM | POA: Diagnosis not present

## 2020-02-04 DIAGNOSIS — Z85828 Personal history of other malignant neoplasm of skin: Secondary | ICD-10-CM | POA: Diagnosis not present

## 2020-02-04 HISTORY — PX: CATARACT EXTRACTION W/PHACO: SHX586

## 2020-02-04 HISTORY — DX: Presence of external hearing-aid: Z97.4

## 2020-02-04 LAB — GLUCOSE, CAPILLARY
Glucose-Capillary: 124 mg/dL — ABNORMAL HIGH (ref 70–99)
Glucose-Capillary: 136 mg/dL — ABNORMAL HIGH (ref 70–99)

## 2020-02-04 SURGERY — PHACOEMULSIFICATION, CATARACT, WITH IOL INSERTION
Anesthesia: Monitor Anesthesia Care | Site: Eye | Laterality: Right

## 2020-02-04 MED ORDER — MOXIFLOXACIN HCL 0.5 % OP SOLN
1.0000 [drp] | OPHTHALMIC | Status: DC | PRN
  Administered 2020-02-04 (×3): 1 [drp] via OPHTHALMIC

## 2020-02-04 MED ORDER — NA HYALUR & NA CHOND-NA HYALUR 0.4-0.35 ML IO KIT
PACK | INTRAOCULAR | Status: DC | PRN
  Administered 2020-02-04: 1 mL via INTRAOCULAR

## 2020-02-04 MED ORDER — ACETAMINOPHEN 10 MG/ML IV SOLN: 1000.0000 mg | Freq: Once | INTRAVENOUS | Status: DC | PRN

## 2020-02-04 MED ORDER — LACTATED RINGERS IV SOLN: INTRAVENOUS | Status: DC

## 2020-02-04 MED ORDER — CEFUROXIME OPHTHALMIC INJECTION 1 MG/0.1 ML
INJECTION | OPHTHALMIC | Status: DC | PRN
  Administered 2020-02-04: 0.1 mL via INTRACAMERAL

## 2020-02-04 MED ORDER — ARMC OPHTHALMIC DILATING DROPS
1.0000 "application " | OPHTHALMIC | Status: DC | PRN
  Administered 2020-02-04 (×3): 1 via OPHTHALMIC

## 2020-02-04 MED ORDER — MIDAZOLAM HCL 2 MG/2ML IJ SOLN
INTRAMUSCULAR | Status: DC | PRN
  Administered 2020-02-04: 1 mg via INTRAVENOUS

## 2020-02-04 MED ORDER — LIDOCAINE HCL (PF) 2 % IJ SOLN
INTRAOCULAR | Status: DC | PRN
  Administered 2020-02-04: 1 mL

## 2020-02-04 MED ORDER — FENTANYL CITRATE (PF) 100 MCG/2ML IJ SOLN
INTRAMUSCULAR | Status: DC | PRN
  Administered 2020-02-04: 50 ug via INTRAVENOUS

## 2020-02-04 MED ORDER — ONDANSETRON HCL 4 MG/2ML IJ SOLN: 4.0000 mg | Freq: Once | INTRAMUSCULAR | Status: DC | PRN

## 2020-02-04 MED ORDER — BRIMONIDINE TARTRATE-TIMOLOL 0.2-0.5 % OP SOLN: OPHTHALMIC | Status: DC | PRN

## 2020-02-04 MED ORDER — TETRACAINE HCL 0.5 % OP SOLN
1.0000 [drp] | OPHTHALMIC | Status: DC | PRN
  Administered 2020-02-04 (×3): 1 [drp] via OPHTHALMIC

## 2020-02-04 SURGICAL SUPPLY — 22 items
CANNULA ANT/CHMB 27G (MISCELLANEOUS) ×1 IMPLANT
CANNULA ANT/CHMB 27GA (MISCELLANEOUS) ×3 IMPLANT
GLOVE SURG LX 7.5 STRW (GLOVE) ×2
GLOVE SURG LX STRL 7.5 STRW (GLOVE) ×1 IMPLANT
GLOVE SURG TRIUMPH 8.0 PF LTX (GLOVE) ×3 IMPLANT
GOWN STRL REUS W/ TWL LRG LVL3 (GOWN DISPOSABLE) ×2 IMPLANT
GOWN STRL REUS W/TWL LRG LVL3 (GOWN DISPOSABLE) ×6
LENS IOL TECNIS EYHANCE 19.0 (Intraocular Lens) ×2 IMPLANT
MARKER SKIN DUAL TIP RULER LAB (MISCELLANEOUS) ×3 IMPLANT
NDL CAPSULORHEX 25GA (NEEDLE) ×1 IMPLANT
NDL FILTER BLUNT 18X1 1/2 (NEEDLE) ×2 IMPLANT
NEEDLE CAPSULORHEX 25GA (NEEDLE) ×3 IMPLANT
NEEDLE FILTER BLUNT 18X 1/2SAF (NEEDLE) ×4
NEEDLE FILTER BLUNT 18X1 1/2 (NEEDLE) ×2 IMPLANT
PACK CATARACT BRASINGTON (MISCELLANEOUS) ×3 IMPLANT
PACK EYE AFTER SURG (MISCELLANEOUS) ×3 IMPLANT
PACK OPTHALMIC (MISCELLANEOUS) ×3 IMPLANT
SOLUTION OPHTHALMIC SALT (MISCELLANEOUS) ×3 IMPLANT
SYR 3ML LL SCALE MARK (SYRINGE) ×6 IMPLANT
SYR TB 1ML LUER SLIP (SYRINGE) ×3 IMPLANT
WATER STERILE IRR 250ML POUR (IV SOLUTION) ×3 IMPLANT
WIPE NON LINTING 3.25X3.25 (MISCELLANEOUS) ×3 IMPLANT

## 2020-02-04 NOTE — Anesthesia Postprocedure Evaluation (Signed)
Anesthesia Post Note  Patient: Kirk Russell  Procedure(s) Performed: CATARACT EXTRACTION PHACO AND INTRAOCULAR LENS PLACEMENT (IOC) RIGHT DIABETIC  6.01  01:11.3 (Right Eye)     Patient location during evaluation: PACU Anesthesia Type: MAC Level of consciousness: awake and alert Pain management: pain level controlled Vital Signs Assessment: post-procedure vital signs reviewed and stable Respiratory status: spontaneous breathing, nonlabored ventilation, respiratory function stable and patient connected to nasal cannula oxygen Cardiovascular status: stable and blood pressure returned to baseline Postop Assessment: no apparent nausea or vomiting Anesthetic complications: no   No complications documented.  Sharmila Wrobleski A  Desiray Orchard

## 2020-02-04 NOTE — Transfer of Care (Signed)
Immediate Anesthesia Transfer of Care Note  Patient: Kirk Russell  Procedure(s) Performed: CATARACT EXTRACTION PHACO AND INTRAOCULAR LENS PLACEMENT (IOC) RIGHT DIABETIC  6.01  01:11.3 (Right Eye)  Patient Location: PACU  Anesthesia Type: MAC  Level of Consciousness: awake, alert  and patient cooperative  Airway and Oxygen Therapy: Patient Spontanous Breathing and Patient connected to supplemental oxygen  Post-op Assessment: Post-op Vital signs reviewed, Patient's Cardiovascular Status Stable, Respiratory Function Stable, Patent Airway and No signs of Nausea or vomiting  Post-op Vital Signs: Reviewed and stable  Complications: No complications documented.

## 2020-02-04 NOTE — OR Nursing (Signed)
Timolol Maleate ophthalmic solution-1 drop given in right eye

## 2020-02-04 NOTE — H&P (Signed)

## 2020-02-04 NOTE — Anesthesia Procedure Notes (Signed)
Date/Time: 02/04/2020 10:40 AM Performed by: Dionne Bucy, CRNA Pre-anesthesia Checklist: Patient identified, Emergency Drugs available, Suction available, Patient being monitored and Timeout performed Oxygen Delivery Method: Nasal cannula Placement Confirmation: positive ETCO2

## 2020-02-04 NOTE — Op Note (Signed)
LOCATION:  [facility]   PREOPERATIVE DIAGNOSIS:    Nuclear sclerotic cataract right eye. H25.11   POSTOPERATIVE DIAGNOSIS:  Nuclear sclerotic cataract right eye.     PROCEDURE:  Phacoemusification with posterior chamber intraocular lens placement of the right eye   ULTRASOUND TIME: Procedure(s) with comments: CATARACT EXTRACTION PHACO AND INTRAOCULAR LENS PLACEMENT (IOC) RIGHT DIABETIC  6.01  01:11.3 (Right) - Diabetic - oral meds  LENS:   Implant Name Type Inv. Item Serial No. Manufacturer Lot No. LRB No. Used Action  LENS II EYHANCE 19.0 - [id number] Intraocular Lens LENS II EYHANCE 19.0 [id number]   Right 1 Implanted         SURGEON:  Wyonia Hough, MD   ANESTHESIA:  Topical with tetracaine drops and 2% Xylocaine jelly, augmented with 1% preservative-free intracameral lidocaine.    COMPLICATIONS:  None.   DESCRIPTION OF PROCEDURE:  The patient was identified in the holding room and transported to the operating room and placed in the supine position under the operating microscope.  The right eye was identified as the operative eye and it was prepped and draped in the usual sterile ophthalmic fashion.   A 1 millimeter clear-corneal paracentesis was made at the 12:00 position.  0.5 ml of preservative-free 1% lidocaine was injected into the anterior chamber. The anterior chamber was filled with Viscoat viscoelastic.  A 2.4 millimeter keratome was used to make a near-clear corneal incision at the 9:00 position.  A curvilinear capsulorrhexis was made with a cystotome and capsulorrhexis forceps.  Balanced salt solution was used to hydrodissect and hydrodelineate the nucleus.   Phacoemulsification was then used in stop and chop fashion to remove the lens nucleus and epinucleus.  The remaining cortex was then removed using the irrigation and aspiration handpiece. Provisc was then placed into the capsular bag to distend it for lens placement.  A lens was then  injected into the capsular bag.  The remaining viscoelastic was aspirated.   Wounds were hydrated with balanced salt solution.  The anterior chamber was inflated to a physiologic pressure with balanced salt solution.  No wound leaks were noted. Cefuroxime 0.1 ml of a 10mg /ml solution was injected into the anterior chamber for a dose of 1 mg of intracameral antibiotic at the completion of the case.   Timolol and Brimonidine drops were applied to the eye.  The patient was taken to the recovery room in stable condition without complications of anesthesia or surgery.   Augusta Hilbert 02/04/2020, 10:56 AM

## 2020-02-05 ENCOUNTER — Encounter: Payer: Self-pay | Admitting: Ophthalmology

## 2020-02-23 ENCOUNTER — Other Ambulatory Visit: Payer: Self-pay | Admitting: *Deleted

## 2020-02-23 MED ORDER — ALFUZOSIN HCL ER 10 MG PO TB24
10.0000 mg | ORAL_TABLET | Freq: Every day | ORAL | 3 refills | Status: DC
Start: 2020-02-23 — End: 2021-06-01

## 2020-04-15 ENCOUNTER — Ambulatory Visit: Payer: Self-pay | Admitting: Physician Assistant

## 2020-04-19 ENCOUNTER — Encounter: Payer: Self-pay | Admitting: Physician Assistant

## 2020-04-19 ENCOUNTER — Telehealth: Payer: Self-pay

## 2020-04-19 ENCOUNTER — Ambulatory Visit (INDEPENDENT_AMBULATORY_CARE_PROVIDER_SITE_OTHER): Payer: Medicare Other | Admitting: Physician Assistant

## 2020-04-19 ENCOUNTER — Other Ambulatory Visit: Payer: Self-pay

## 2020-04-19 VITALS — BP 146/76 | HR 87 | Ht 69.0 in | Wt 210.0 lb

## 2020-04-19 DIAGNOSIS — N5201 Erectile dysfunction due to arterial insufficiency: Secondary | ICD-10-CM

## 2020-04-19 DIAGNOSIS — N401 Enlarged prostate with lower urinary tract symptoms: Secondary | ICD-10-CM | POA: Diagnosis not present

## 2020-04-19 DIAGNOSIS — R3914 Feeling of incomplete bladder emptying: Secondary | ICD-10-CM

## 2020-04-19 LAB — BLADDER SCAN AMB NON-IMAGING

## 2020-04-19 MED ORDER — TADALAFIL 5 MG PO TABS: 5.0000 mg | ORAL_TABLET | Freq: Every day | ORAL | 5 refills | Status: DC

## 2020-04-19 MED ORDER — TADALAFIL 10 MG PO TABS: 10.0000 mg | ORAL_TABLET | ORAL | 3 refills | Status: DC | PRN

## 2020-04-19 NOTE — Progress Notes (Signed)
04/19/2020 9:31 AM   Clearence Ped Tiller Sep 13, 1945 [id number]  CC: Chief Complaint  Patient presents with  . Benign Prostatic Hypertrophy  . Follow-up   HPI: Kirk Russell is a 74 y.o. male with PMH BPH with LUTS on alfuzosin, urethral stricture disease with no evidence of stricture on cystoscopy in June 2020, and ED on tadalafil who presents today for repeat PVR.  He was seen in clinic most recently by Dr. Bernardo Heater on 01/12/2020 and was switched from sildenafil to tadalafil as needed at that time.  Today he reports ongoing urinary urgency, weak stream, and nocturia.  He is most bothered by nocturia x2 and states he is unable to return to sleep after his second nighttime void around 4 AM.  He denies urinary leakage.  He has not yet attempted to have sex since his last clinic visit, so he has not taken tadalafil.  PVR originally 310 mL; he was prompted to void with repeat PVR 197 mL. IPSS 15/4 as below.   IPSS    Row Name 04/19/20 0900         International Prostate Symptom Score   How often have you had the sensation of not emptying your bladder? Less than half the time     How often have you had to urinate less than every two hours? About half the time     How often have you found you stopped and started again several times when you urinated? Less than half the time     How often have you found it difficult to postpone urination? Less than half the time     How often have you had a weak urinary stream? Less than half the time     How often have you had to strain to start urination? Less than 1 in 5 times     How many times did you typically get up at night to urinate? 3 Times     Total IPSS Score 15           Quality of Life due to urinary symptoms   If you were to spend the rest of your life with your urinary condition just the way it is now how would you feel about that? Mostly Disatisfied             PMH: Past Medical History:  Diagnosis Date  . Adenoma  of colon   . Arthritis   . 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 COPD (chronic obstructive pulmonary disease) ([place])   . Coronary artery disease   . Diabetes mellitus without complication ([place])   . Diverticulosis   . Glaucoma   . Hearing impairment   . Hypercholesteremia   . Hypertension   . Insomnia   . Meniere syndrome   . Thrombocytopenia ([place])   . Vertigo   . Wears hearing aid in left ear     Surgical History: Past Surgical History:  Procedure Laterality Date  . ABDOMINAL SURGERY    . APPENDECTOMY    . CATARACT EXTRACTION W/PHACO Right 02/04/2020   Procedure: CATARACT EXTRACTION PHACO AND INTRAOCULAR LENS PLACEMENT ([facility]) RIGHT DIABETIC  6.01  01:11.3;  Surgeon: Leandrew Koyanagi, MD;  Location: [facility];  Service: Ophthalmology;  Laterality: Right;  Diabetic - oral meds  . COLONOSCOPY WITH PROPOFOL N/A 01/22/2018   Procedure: COLONOSCOPY WITH PROPOFOL;  Surgeon: Lollie Sails, MD;  [facility]COPY;  Service: Endoscopy;  Laterality: N/A;  . CORONARY ANGIOPLASTY WITH STENT PLACEMENT  Home Medications:  Allergies as of 04/19/2020      Reactions   Lovastatin    Other reaction(s): Muscle Pain With higher doses      Medication List       Accurate as of April 19, 2020  9:31 AM. If you have any questions, ask your nurse or doctor.        albuterol 108 (90 Base) MCG/ACT inhaler Commonly known as: VENTOLIN HFA Inhale into the lungs every 6 (six) hours as needed for wheezing or shortness of breath.   alfuzosin 10 MG 24 hr tablet Commonly known as: UROXATRAL Take 1 tablet (10 mg total) by mouth daily with breakfast.   Anoro Ellipta 62.5-25 MCG/INH Aepb Generic drug: umeclidinium-vilanterol Inhale 1 puff into the lungs daily.   aspirin EC 81 MG tablet Take 81 mg by mouth daily.   Co-Enzyme Q10 200 MG Caps Take by mouth.   dorzolamide-timolol 22.3-6.8 MG/ML ophthalmic solution Commonly known as: COSOPT 1 drop 2 (two) times daily.    glucosamine-chondroitin 500-400 MG tablet Take 3 tablets by mouth daily.   latanoprost 0.005 % ophthalmic solution Commonly known as: XALATAN 1 drop at bedtime.   lisinopril 10 MG tablet Commonly known as: ZESTRIL Take 10 mg by mouth daily.   lovastatin 20 MG tablet Commonly known as: MEVACOR Take by mouth.   metFORMIN 500 MG tablet Commonly known as: GLUCOPHAGE Take 500 mg by mouth 2 (two) times daily with a meal.   NEURIVA PO Take by mouth daily.   tadalafil 20 MG tablet Commonly known as: CIALIS 1 tab 1 hour prior to intercourse       Allergies:  Allergies  Allergen Reactions  . Lovastatin     Other reaction(s): Muscle Pain With higher doses    Family History: Family History  Problem Relation Age of Onset  . Breast cancer Mother   . Prostate cancer Neg Hx   . Kidney cancer Neg Hx   . Bladder Cancer Neg Hx     Social History:   reports that he quit smoking about 32 years ago. He has quit using smokeless tobacco. He reports current alcohol use of about 21.0 standard drinks of alcohol per week. He reports that he does not use drugs.  Physical Exam: BP (!) 146/76 (BP Location: Left Arm, Patient Position: Sitting, Cuff Size: Large)   Pulse 87   Ht 5\' 9"  (1.753 m)   Wt 210 lb (95.3 kg)   BMI 31.01 kg/m   Constitutional:  Alert and oriented, no acute distress, nontoxic appearing HEENT: [place], AT Cardiovascular: No clubbing, cyanosis, or edema Respiratory: Normal respiratory effort, no increased work of breathing Skin: No rashes, bruises or suspicious lesions Neurologic: Grossly intact, no focal deficits, moving all 4 extremities Psychiatric: Normal mood and affect  Laboratory Data: Results for orders placed or performed in visit on 04/19/20  Bladder Scan (Post Void Residual) in office  Result Value Ref Range   Scan Result 336mL    Scan Result 147mL    Assessment & Plan:   1. Benign prostatic hyperplasia with incomplete bladder emptying PVR remains  slightly elevated.  We discussed various treatment options today including initiation of finasteride with likely worsening of erectile dysfunction, adjustment of tadalafil dosing to 5 mg daily in conjunction with alfuzosin, and bladder outlet work-up with plans for possible outlet procedure.  Patient wishes to defer surgery at this time.  He is most amenable to adjusting his tadalafil dosing; I am in agreement with this plan.  We will plan for symptom recheck and PVR in 3 months. - Bladder Scan (Post Void Residual) in office - tadalafil (CIALIS) 5 MG tablet; Take 1 tablet (5 mg total) by mouth daily.  Dispense: 30 tablet; Refill: 5  2. Erectile dysfunction due to arterial insufficiency Counseled patient to stop tadalafil 20 mg and start daily 5 mg dosing as above.  Also prescribing 10 mg as needed doses to induce erection.  Patient expressed understanding. - tadalafil (CIALIS) 5 MG tablet; Take 1 tablet (5 mg total) by mouth daily.  Dispense: 30 tablet; Refill: 5 - tadalafil (CIALIS) 10 MG tablet; Take 1 tablet (10 mg total) by mouth as needed for erectile dysfunction.  Dispense: 15 tablet; Refill: 3   Return in about 3 months (around 07/18/2020) for Symptom recheck with PVR.  Debroah Loop, PA-C  [facility] [address], [place], [place] [address] [phone]

## 2020-04-19 NOTE — Patient Instructions (Signed)
Continue alfuzosin daily. Today we discussed tweaking your tadalafil (Cialis) dosing to help both your urinary symptoms and your erectile dysfunction. I am prescribing this medication in 2 doses today: I want you to start taking the 5mg  tablets once daily and save the 10mg  tablets to take as needed in advance of intercourse. You may stop the 20mg  tablets you have at home.

## 2020-04-19 NOTE — Telephone Encounter (Signed)
Patient called stating his insurance will not cover Cialis 10 and 5mg . Patient was given goodrx information and coupon was sent via text for both 5&10mg . Scripts were sent to [facility] pharmacy, Kirk Russell was updated and new quantity was given via verbal order. Patient verbalized understanding

## 2020-07-19 ENCOUNTER — Ambulatory Visit: Payer: Self-pay | Admitting: Physician Assistant

## 2020-08-19 ENCOUNTER — Other Ambulatory Visit: Payer: Self-pay | Admitting: Pulmonary Disease

## 2020-08-19 DIAGNOSIS — J849 Interstitial pulmonary disease, unspecified: Secondary | ICD-10-CM

## 2020-08-23 ENCOUNTER — Institutional Professional Consult (permissible substitution): Payer: BLUE CROSS/BLUE SHIELD | Admitting: Pulmonary Disease

## 2020-09-01 ENCOUNTER — Ambulatory Visit
Admission: RE | Admit: 2020-09-01 | Discharge: 2020-09-01 | Disposition: A | Discharge status: Home / Self Care | Payer: Medicare Other | Source: Ambulatory Visit | Attending: Pulmonary Disease | Admitting: Pulmonary Disease

## 2020-09-01 ENCOUNTER — Other Ambulatory Visit: Payer: Self-pay

## 2020-09-01 DIAGNOSIS — J849 Interstitial pulmonary disease, unspecified: Secondary | ICD-10-CM | POA: Diagnosis present

## 2020-09-10 ENCOUNTER — Other Ambulatory Visit: Payer: Self-pay | Admitting: Pulmonary Disease

## 2020-09-10 DIAGNOSIS — J189 Pneumonia, unspecified organism: Secondary | ICD-10-CM

## 2020-09-28 ENCOUNTER — Institutional Professional Consult (permissible substitution): Payer: BLUE CROSS/BLUE SHIELD | Admitting: Pulmonary Disease

## 2020-10-07 ENCOUNTER — Other Ambulatory Visit: Payer: Self-pay

## 2020-10-07 ENCOUNTER — Ambulatory Visit
Admission: RE | Admit: 2020-10-07 | Discharge: 2020-10-07 | Disposition: A | Discharge status: Home / Self Care | Payer: Medicare Other | Source: Ambulatory Visit | Attending: Pulmonary Disease | Admitting: Pulmonary Disease

## 2020-10-07 DIAGNOSIS — J189 Pneumonia, unspecified organism: Secondary | ICD-10-CM | POA: Insufficient documentation

## 2021-01-12 ENCOUNTER — Encounter: Payer: Self-pay | Admitting: Urology

## 2021-01-12 ENCOUNTER — Other Ambulatory Visit: Payer: Self-pay

## 2021-01-12 ENCOUNTER — Ambulatory Visit (INDEPENDENT_AMBULATORY_CARE_PROVIDER_SITE_OTHER): Payer: Medicare Other | Admitting: Urology

## 2021-01-12 VITALS — BP 150/85 | HR 92 | Ht 69.0 in | Wt 210.0 lb

## 2021-01-12 DIAGNOSIS — R3914 Feeling of incomplete bladder emptying: Secondary | ICD-10-CM | POA: Diagnosis not present

## 2021-01-12 DIAGNOSIS — N401 Enlarged prostate with lower urinary tract symptoms: Secondary | ICD-10-CM | POA: Diagnosis not present

## 2021-01-12 LAB — BLADDER SCAN AMB NON-IMAGING: Scan Result: 283

## 2021-01-12 MED ORDER — SILODOSIN 8 MG PO CAPS: 8.0000 mg | ORAL_CAPSULE | Freq: Every day | ORAL | 3 refills | Status: DC

## 2021-01-12 NOTE — Patient Instructions (Addendum)
Stop Alfuzosin

## 2021-01-12 NOTE — Progress Notes (Signed)
01/12/2021 10:58 AM   Kirk Russell Dec 20, 1945 [id number]  Referring provider: Baxter Hire, MD [place],  [place] [address]  Chief Complaint  Patient presents with   Benign Prostatic Hypertrophy    Urologic history: 1.  BPH with incomplete bladder emptying -Alfuzosin daily -Baseline PVR <150 mL   2.  History urethral stricture disease -Cystoscopy 09/2018 with moderate lateral lobe enlargement; no stricture identified  HPI: 75 y.o. male presents for annual follow-up.  Follow-up bladder scan PVR 04/19/2020 was 197 mL Stable LUTS Most bothersome symptom is nocturia x3 Remains on alfuzosin Denies dysuria, gross hematuria Denies flank, abdominal or pelvic pain   PMH: Past Medical History:  Diagnosis Date   Adenoma of colon    Arthritis    Celiac artery dissection ([facility])    COPD (chronic obstructive pulmonary disease) ([facility])    Coronary artery disease    Diabetes mellitus without complication ([facility])    Diverticulosis    Glaucoma    Hearing impairment    Hypercholesteremia    Hypertension    Insomnia    Meniere syndrome    Thrombocytopenia ([facility])    Vertigo    Wears hearing aid in left ear     Surgical History: Past Surgical History:  Procedure Laterality Date   ABDOMINAL SURGERY     APPENDECTOMY     CATARACT EXTRACTION W/PHACO Right 02/04/2020   Procedure: CATARACT EXTRACTION PHACO AND INTRAOCULAR LENS PLACEMENT ([place]) RIGHT DIABETIC  6.01  01:11.3;  Surgeon: Leandrew Koyanagi, MD;  Location: [facility];  Service: Ophthalmology;  Laterality: Right;  Diabetic - oral meds   COLONOSCOPY WITH PROPOFOL N/A 01/22/2018   Procedure: COLONOSCOPY WITH PROPOFOL;  Surgeon: Lollie Sails, MD;  [facility] END[facility]COPY;  Service: Endoscopy;  Laterality: N/A;   CORONARY ANGIOPLASTY WITH STENT PLACEMENT      Home Medications:  Allergies as of 01/12/2021       Reactions   Lovastatin    Other reaction(s): Muscle Pain With  higher doses        Medication List        Accurate as of January 12, 2021 10:58 AM. If you have any questions, ask your nurse or doctor.          albuterol 108 (90 Base) MCG/ACT inhaler Commonly known as: VENTOLIN HFA Inhale into the lungs every 6 (six) hours as needed for wheezing or shortness of breath.   alfuzosin 10 MG 24 hr tablet Commonly known as: UROXATRAL Take 1 tablet (10 mg total) by mouth daily with breakfast.   aspirin EC 81 MG tablet Take 81 mg by mouth daily.   Co-Enzyme Q10 200 MG Caps Take by mouth.   dorzolamide-timolol 22.3-6.8 MG/ML ophthalmic solution Commonly known as: COSOPT 1 drop 2 (two) times daily.   glucosamine-chondroitin 500-400 MG tablet Take 3 tablets by mouth daily.   latanoprost 0.005 % ophthalmic solution Commonly known as: XALATAN 1 drop at bedtime.   lisinopril 10 MG tablet Commonly known as: ZESTRIL Take 10 mg by mouth daily.   lovastatin 20 MG tablet Commonly known as: MEVACOR Take by mouth.   metFORMIN 500 MG tablet Commonly known as: GLUCOPHAGE Take 500 mg by mouth 2 (two) times daily with a meal.   NEURIVA PO Take by mouth daily.   tadalafil 5 MG tablet Commonly known as: CIALIS Take 1 tablet (5 mg total) by mouth daily.   tadalafil 10 MG tablet Commonly known as: Cialis Take 1 tablet (10 mg  total) by mouth as needed for erectile dysfunction.   umeclidinium-vilanterol 62.5-25 MCG/INH Aepb Commonly known as: ANORO ELLIPTA Inhale 1 puff into the lungs daily.        Allergies:  Allergies  Allergen Reactions   Lovastatin     Other reaction(s): Muscle Pain With higher doses    Family History: Family History  Problem Relation Age of Onset   Breast cancer Mother    Prostate cancer Neg Hx    Kidney cancer Neg Hx    Bladder Cancer Neg Hx     Social History:  reports that he quit smoking about 32 years ago. His smoking use included cigarettes. He has quit using smokeless tobacco. He reports  current alcohol use of about 21.0 standard drinks per week. He reports that he does not use drugs.   Physical Exam: BP (!) 150/85   Pulse (!) 2   Ht 5\' 9"  (1.753 m)   Wt 210 lb (95.3 kg)   BMI 31.01 kg/m   Constitutional:  Alert and oriented, No acute distress. HEENT: [place] AT, moist mucus membranes.  Trachea midline, no masses. Cardiovascular: No clubbing, cyanosis, or edema. Respiratory: Normal respiratory effort, no increased work of breathing. Neurologic: Grossly intact, no focal deficits, moving all 4 extremities. Psychiatric: Normal mood and affect.   Assessment & Plan:    1.  BPH with incomplete bladder emptying Worsening; bladder scan PVR today was 283 mL which is a significant increase from prior baseline of <200 Trial of a prostate-specific alpha-blocker and lieu of alfuzosin and Rx low-dose and sent to pharmacy 1 month PA follow-up with repeat symptom check and bladder scan PVR   Abbie Sons, MD  [facility] [address], [place], [place] [address] [phone]

## 2021-01-18 ENCOUNTER — Encounter: Payer: Self-pay | Admitting: Urology

## 2021-02-04 ENCOUNTER — Other Ambulatory Visit: Payer: Self-pay | Admitting: Pulmonary Disease

## 2021-02-04 DIAGNOSIS — R911 Solitary pulmonary nodule: Secondary | ICD-10-CM

## 2021-02-18 ENCOUNTER — Ambulatory Visit (INDEPENDENT_AMBULATORY_CARE_PROVIDER_SITE_OTHER): Payer: Medicare Other | Admitting: Urology

## 2021-02-18 ENCOUNTER — Other Ambulatory Visit: Payer: Self-pay

## 2021-02-18 ENCOUNTER — Encounter: Payer: Self-pay | Admitting: Urology

## 2021-02-18 VITALS — BP 127/80 | HR 87 | Ht 69.0 in | Wt 210.0 lb

## 2021-02-18 DIAGNOSIS — R3914 Feeling of incomplete bladder emptying: Secondary | ICD-10-CM | POA: Diagnosis not present

## 2021-02-18 DIAGNOSIS — N401 Enlarged prostate with lower urinary tract symptoms: Secondary | ICD-10-CM | POA: Diagnosis not present

## 2021-02-18 LAB — BLADDER SCAN AMB NON-IMAGING: Scan Result: 127

## 2021-02-18 NOTE — Progress Notes (Signed)
02/18/2021 9:17 AM   Kirk Russell [birth year] [id number]  Referring provider: Baxter Hire, MD [place],  [place] [address]  Chief Complaint  Patient presents with   Benign Prostatic Hypertrophy    Urologic history: 1.  BPH with incomplete bladder emptying -Alfuzosin daily -Baseline PVR <150 mL   2.  History urethral stricture disease -Cystoscopy 09/2018 with moderate lateral lobe enlargement; no stricture identified  HPI: 75 y.o. male presents for 1 month follow-up.  Seen 01/12/2021 for annual visit; PVR elevated above baseline at 283 mL Alpha-blocker switched to silodosin and 1 month follow-up was recommended He does not feel he silodosin is any more effective than alfuzosin and is more expensive Bladder scan PVR today is 127 mL   PMH: Past Medical History:  Diagnosis Date   Adenoma of colon    Arthritis    Celiac artery dissection ([facility])    COPD (chronic obstructive pulmonary disease) ([facility])    Coronary artery disease    Diabetes mellitus without complication ([facility])    Diverticulosis    Glaucoma    Hearing impairment    Hypercholesteremia    Hypertension    Insomnia    Meniere syndrome    Thrombocytopenia ([facility])    Vertigo    Wears hearing aid in left ear     Surgical History: Past Surgical History:  Procedure Laterality Date   ABDOMINAL SURGERY     APPENDECTOMY     CATARACT EXTRACTION W/PHACO Right 02/04/2020   Procedure: CATARACT EXTRACTION PHACO AND INTRAOCULAR LENS PLACEMENT ([place]) RIGHT DIABETIC  6.01  01:11.3;  Surgeon: Leandrew Koyanagi, MD;  Location: [facility];  Service: Ophthalmology;  Laterality: Right;  Diabetic - oral meds   COLONOSCOPY WITH PROPOFOL N/A 01/22/2018   Procedure: COLONOSCOPY WITH PROPOFOL;  Surgeon: Lollie Sails, MD;  Location[facility] ENDOSCOPY;  Service: Endoscopy;  Laterality: N/A;   CORONARY ANGIOPLASTY WITH STENT PLACEMENT      Home Medications:  Allergies as of 02/18/2021        Reactions   Lovastatin    Other reaction(s): Muscle Pain With higher doses        Medication List        Accurate as of February 18, 2021  9:17 AM. If you have any questions, ask your nurse or doctor.          albuterol 108 (90 Base) MCG/ACT inhaler Commonly known as: VENTOLIN HFA Inhale into the lungs every 6 (six) hours as needed for wheezing or shortness of breath.   alfuzosin 10 MG 24 hr tablet Commonly known as: UROXATRAL Take 1 tablet (10 mg total) by mouth daily with breakfast.   aspirin EC 81 MG tablet Take 81 mg by mouth daily.   Co-Enzyme Q10 200 MG Caps Take by mouth.   dorzolamide-timolol 22.3-6.8 MG/ML ophthalmic solution Commonly known as: COSOPT 1 drop 2 (two) times daily.   glucosamine-chondroitin 500-400 MG tablet Take 3 tablets by mouth daily.   latanoprost 0.005 % ophthalmic solution Commonly known as: XALATAN 1 drop at bedtime.   lisinopril 10 MG tablet Commonly known as: ZESTRIL Take 10 mg by mouth daily.   lovastatin 20 MG tablet Commonly known as: MEVACOR Take by mouth.   metFORMIN 500 MG tablet Commonly known as: GLUCOPHAGE Take 500 mg by mouth 2 (two) times daily with a meal.   NEURIVA PO Take by mouth daily.   silodosin 8 MG Caps capsule Commonly known as: RAPAFLO Take 1 capsule (8 mg  total) by mouth daily with breakfast.   tadalafil 5 MG tablet Commonly known as: CIALIS Take 1 tablet (5 mg total) by mouth daily.   tadalafil 10 MG tablet Commonly known as: Cialis Take 1 tablet (10 mg total) by mouth as needed for erectile dysfunction.   umeclidinium-vilanterol 62.5-25 MCG/INH Aepb Commonly known as: ANORO ELLIPTA Inhale 1 puff into the lungs daily.        Allergies:  Allergies  Allergen Reactions   Lovastatin     Other reaction(s): Muscle Pain With higher doses    Family History: Family History  Problem Relation Age of Onset   Breast cancer Mother    Prostate cancer Neg Hx    Kidney cancer Neg  Hx    Bladder Cancer Neg Hx     Social History:  reports that he quit smoking about 32 years ago. His smoking use included cigarettes. He has quit using smokeless tobacco. He reports current alcohol use of about 21.0 standard drinks per week. He reports that he does not use drugs.   Physical Exam: BP 127/80   Pulse 87   Ht 5\' 9"  (1.753 m)   Wt 210 lb (95.3 kg)   BMI 31.01 kg/m   Constitutional:  Alert and oriented, No acute distress. Psychiatric: Normal mood and affect.   Assessment & Plan:    1.  BPH with LUTS PVR back to baseline He does not feel silodosin is any more effective than alfuzosin He will go back to alfuzosin since it is less expensive and we will recheck a PVR in 6 months Surgical options were discussed including minimally invasive procedures, Rezum and UroLift; TURP was also discussed.  He was given a UroLift brochure   Abbie Sons, MD  [facility] [address] [place], [place] [address] [phone]

## 2021-02-28 ENCOUNTER — Ambulatory Visit
Admission: RE | Admit: 2021-02-28 | Discharge: 2021-02-28 | Disposition: A | Discharge status: Home / Self Care | Payer: Medicare Other | Source: Ambulatory Visit | Attending: Pulmonary Disease | Admitting: Pulmonary Disease

## 2021-02-28 ENCOUNTER — Other Ambulatory Visit: Payer: Self-pay

## 2021-02-28 DIAGNOSIS — R911 Solitary pulmonary nodule: Secondary | ICD-10-CM | POA: Diagnosis present

## 2021-02-28 HISTORY — DX: Malignant (primary) neoplasm, unspecified: C80.1

## 2021-02-28 LAB — POCT I-STAT CREATININE: Creatinine, Ser: 0.9 mg/dL (ref 0.61–1.24)

## 2021-02-28 MED ORDER — IOHEXOL 300 MG/ML  SOLN
75.0000 mL | Freq: Once | INTRAMUSCULAR | Status: AC | PRN
  Administered 2021-02-28: 75 mL via INTRAVENOUS

## 2021-05-27 ENCOUNTER — Encounter: Payer: Self-pay | Admitting: Gastroenterology

## 2021-05-30 ENCOUNTER — Ambulatory Visit
Admission: RE | Admit: 2021-05-30 | Discharge: 2021-05-30 | Disposition: A | Discharge status: Home / Self Care | Payer: Medicare Other | Attending: Gastroenterology | Admitting: Gastroenterology

## 2021-05-30 ENCOUNTER — Ambulatory Visit: Payer: Medicare Other | Admitting: Anesthesiology

## 2021-05-30 ENCOUNTER — Encounter: Payer: Self-pay | Admitting: Gastroenterology

## 2021-05-30 ENCOUNTER — Other Ambulatory Visit: Payer: Self-pay

## 2021-05-30 ENCOUNTER — Encounter
Admission: RE | Disposition: A | Discharge status: Home / Self Care | Payer: Self-pay | Source: Home / Self Care | Attending: Gastroenterology

## 2021-05-30 DIAGNOSIS — K5730 Diverticulosis of large intestine without perforation or abscess without bleeding: Secondary | ICD-10-CM | POA: Insufficient documentation

## 2021-05-30 DIAGNOSIS — I2510 Atherosclerotic heart disease of native coronary artery without angina pectoris: Secondary | ICD-10-CM | POA: Insufficient documentation

## 2021-05-30 DIAGNOSIS — I10 Essential (primary) hypertension: Secondary | ICD-10-CM | POA: Diagnosis not present

## 2021-05-30 DIAGNOSIS — E119 Type 2 diabetes mellitus without complications: Secondary | ICD-10-CM | POA: Insufficient documentation

## 2021-05-30 DIAGNOSIS — K921 Melena: Secondary | ICD-10-CM | POA: Insufficient documentation

## 2021-05-30 DIAGNOSIS — J449 Chronic obstructive pulmonary disease, unspecified: Secondary | ICD-10-CM | POA: Insufficient documentation

## 2021-05-30 DIAGNOSIS — K640 First degree hemorrhoids: Secondary | ICD-10-CM | POA: Diagnosis not present

## 2021-05-30 DIAGNOSIS — K635 Polyp of colon: Secondary | ICD-10-CM | POA: Insufficient documentation

## 2021-05-30 DIAGNOSIS — K633 Ulcer of intestine: Secondary | ICD-10-CM | POA: Diagnosis not present

## 2021-05-30 DIAGNOSIS — K529 Noninfective gastroenteritis and colitis, unspecified: Secondary | ICD-10-CM | POA: Insufficient documentation

## 2021-05-30 HISTORY — DX: Other complications of anesthesia, initial encounter: T88.59XA

## 2021-05-30 HISTORY — PX: COLONOSCOPY: SHX5424

## 2021-05-30 LAB — GLUCOSE, CAPILLARY: Glucose-Capillary: 126 mg/dL — ABNORMAL HIGH (ref 70–99)

## 2021-05-30 SURGERY — COLONOSCOPY
Anesthesia: General

## 2021-05-30 MED ORDER — PROPOFOL 10 MG/ML IV BOLUS
INTRAVENOUS | Status: DC | PRN
  Administered 2021-05-30: 100 mg via INTRAVENOUS

## 2021-05-30 MED ORDER — SODIUM CHLORIDE 0.9 % IV SOLN: INTRAVENOUS | Status: DC

## 2021-05-30 MED ORDER — PROPOFOL 500 MG/50ML IV EMUL
INTRAVENOUS | Status: DC | PRN
  Administered 2021-05-30: 110 ug/kg/min via INTRAVENOUS

## 2021-05-30 NOTE — Interval H&P Note (Signed)
History and Physical Interval Note: Preprocedure H&P from 05/30/21  was reviewed and there was no interval change after seeing and examining the patient.  Written consent was obtained from the patient after discussion of risks, benefits, and alternatives. Patient has consented to proceed with Colonoscopy with possible intervention   05/30/2021 9:51 AM  Kirk Russell  has presented today for surgery, with the diagnosis of Chronic constipation (K59.09) Rectal bleeding (K62.5).  The various methods of treatment have been discussed with the patient and family. After consideration of risks, benefits and other options for treatment, the patient has consented to  Procedure(s) with comments: COLONOSCOPY (N/A) - DIET CONTROLLED as a surgical intervention.  The patient's history has been reviewed, patient examined, no change in status, stable for surgery.  I have reviewed the patient's chart and labs.  Questions were answered to the patient's satisfaction.     Annamaria Helling

## 2021-05-30 NOTE — H&P (Signed)
Pre-Procedure H&P   Patient ID: Kirk Russell is a 76 y.o. male.  Gastroenterology Provider: Annamaria Helling, DO  Referring Provider: Laurine Blazer, PA PCP: Baxter Hire, MD  Date: 05/30/2021  HPI [name] is a 76 y.o. male who presents today for Colonoscopy for Changes in stool habits, constipation, hematochezia.  Changes in stool- more constipation and straining. Notes blood with straining as well. Using miralax and stool softener, otherwise hard to pass bm. No melena/diarrhea. No other acute gi complaints. Appetite and weight maintained. No straining with bowel prep  Had bowel surgery at infancy. 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   Celiac artery dissection (HCC)    Complication of anesthesia    COPD (chronic obstructive pulmonary disease) (HCC)    Coronary artery disease    Diabetes mellitus without complication (HCC)    Diverticulosis    Glaucoma    Hearing impairment    Hypercholesteremia    Hypertension    Insomnia    Meniere syndrome    Thrombocytopenia (HCC)    Vertigo    Wears hearing aid in left ear     Past Surgical History:  Procedure Laterality Date   ABDOMINAL SURGERY     APPENDECTOMY     CATARACT EXTRACTION W/PHACO Right 02/04/2020   Procedure: CATARACT EXTRACTION PHACO AND INTRAOCULAR LENS PLACEMENT ([place]) RIGHT DIABETIC  6.01  01:11.3;  Surgeon: Leandrew Koyanagi, MD;  Location: [facility];  Service: Ophthalmology;  Laterality: Right;  Diabetic - oral meds   COLONOSCOPY     COLONOSCOPY     COLONOSCOPY WITH PROPOFOL N/A 01/22/2018   Procedure: COLONOSCOPY WITH PROPOFOL;  Surgeon: Lollie Sails, MD;  Location: [facility];  Service: Endoscopy;   Laterality: N/A;   CORONARY ANGIOPLASTY WITH STENT PLACEMENT      Family History PGM - CRC No other h/o GI disease or malignancy  Review of Systems  Constitutional:  Negative for activity change, appetite change, chills, diaphoresis, fatigue, fever and unexpected weight change.  HENT:  Negative for trouble swallowing and voice change.   Respiratory:  Negative for shortness of breath and wheezing.   Cardiovascular:  Negative for chest pain, palpitations and leg swelling.  Gastrointestinal:  Positive for abdominal pain, blood in stool and constipation. Negative for abdominal distention, anal bleeding, diarrhea, nausea and vomiting.       +urgency  Musculoskeletal:  Negative for arthralgias and myalgias.  Skin:  Negative for color change and pallor.  Neurological:  Negative for dizziness, syncope and weakness.  Psychiatric/Behavioral:  Negative for confusion. The patient is not nervous/anxious.   All other systems reviewed and are negative.   Medications No current facility-administered medications on file prior to encounter.   Current Outpatient Medications on File Prior to Encounter  Medication Sig Dispense Refill   latanoprost (XALATAN) 0.005 % ophthalmic solution 1 drop at bedtime.     umeclidinium-vilanterol (ANORO ELLIPTA) 62.5-25 MCG/INH AEPB Inhale 1 puff into the lungs daily.     albuterol (VENTOLIN HFA) 108 (90 Base) MCG/ACT inhaler Inhale into the lungs every 6 (six) hours as needed for wheezing or shortness of breath.     alfuzosin (UROXATRAL) 10 MG 24  hr tablet Take 1 tablet (10 mg total) by mouth daily with breakfast. 90 tablet 3   aspirin EC 81 MG tablet Take 81 mg by mouth daily.     Co-Enzyme Q10 200 MG CAPS Take by mouth.     dorzolamide-timolol (COSOPT) 22.3-6.8 MG/ML ophthalmic solution 1 drop 2 (two) times daily.     glucosamine-chondroitin 500-400 MG tablet Take 3 tablets by mouth daily.     lisinopril (PRINIVIL,ZESTRIL) 10 MG tablet Take 10 mg by mouth daily.      lovastatin (MEVACOR) 20 MG tablet Take by mouth.     metFORMIN (GLUCOPHAGE) 500 MG tablet Take 500 mg by mouth 2 (two) times daily with a meal.     Misc Natural Products (NEURIVA PO) Take by mouth daily.     silodosin (RAPAFLO) 8 MG CAPS capsule Take 1 capsule (8 mg total) by mouth daily with breakfast. 30 capsule 3   tadalafil (CIALIS) 10 MG tablet Take 1 tablet (10 mg total) by mouth as needed for erectile dysfunction. 30 tablet 3   tadalafil (CIALIS) 5 MG tablet Take 1 tablet (5 mg total) by mouth daily. 30 tablet 5    Pertinent medications related to GI and procedure were reviewed by me with the patient prior to the procedure   Current Facility-Administered Medications:    0.9 %  sodium chloride infusion, , Intravenous, Continuous, Annamaria Helling, DO, Last Rate: 20 mL/hr at 05/30/21 7622, Continued from Pre-op at 05/30/21 0916  sodium chloride 20 mL/hr at 05/30/21 6333       Allergies  Allergen Reactions   Lovastatin     Other reaction(s): Muscle Pain With higher doses   Allergies were reviewed by me prior to the procedure  Objective    Vitals:   05/30/21 0905  BP: (!) 150/89  Pulse: 77  Resp: 18  Temp: (!) 97.2 F (36.2 C)  TempSrc: Temporal  SpO2: 96%  Weight: 92.1 kg  Height: 5\' 8"  (1.727 m)     Physical Exam Vitals and nursing note reviewed.  Constitutional:      General: He is not in acute distress.    Appearance: Normal appearance. He is not ill-appearing, toxic-appearing or diaphoretic.  HENT:     Head: Normocephalic and atraumatic.     Nose: Nose normal.     Mouth/Throat:     Mouth: Mucous membranes are moist.     Pharynx: Oropharynx is clear.  Eyes:     General: No scleral icterus.    Extraocular Movements: Extraocular movements intact.  Cardiovascular:     Rate and Rhythm: Normal rate and regular rhythm.     Heart sounds: Normal heart sounds. No murmur heard.   No friction rub. No gallop.  Pulmonary:     Effort: Pulmonary effort is  normal. No respiratory distress.     Breath sounds: Normal breath sounds. No wheezing, rhonchi or rales.  Abdominal:     General: Bowel sounds are normal. There is no distension.     Palpations: Abdomen is soft.     Tenderness: There is no abdominal tenderness. There is no guarding or rebound.  Musculoskeletal:     Cervical back: Neck supple.     Right lower leg: No edema.     Left lower leg: No edema.  Skin:    General: Skin is warm and dry.     Coloration: Skin is not jaundiced or pale.  Neurological:     General: No focal deficit present.  Mental Status: He is alert and oriented to person, place, and time. Mental status is at baseline.  Psychiatric:        Mood and Affect: Mood normal.        Behavior: Behavior normal.        Thought Content: Thought content normal.        Judgment: Judgment normal.     Assessment:  [name] is a 76 y.o. male  who presents today for Colonoscopy for Changes in stool habits, constipation, hematochezia.  Plan:  Colonoscopy with possible intervention today  Colonoscopy with possible biopsy, control of bleeding, polypectomy, and interventions as necessary has been discussed with the patient/patient representative. Informed consent was obtained from the patient/patient representative after explaining the indication, nature, and risks of the procedure including but not limited to death, bleeding, perforation, missed neoplasm/lesions, cardiorespiratory compromise, and reaction to medications. Opportunity for questions was given and appropriate answers were provided. Patient/patient representative has verbalized understanding is amenable to undergoing the procedure.   Annamaria Helling, DO  [facility] Gastroenterology  Portions of the record may have been created with voice recognition software. Occasional wrong-word or 'sound-a-like' substitutions may have occurred due to the inherent limitations of voice recognition  software.  Read the chart carefully and recognize, using context, where substitutions may have occurred.

## 2021-05-30 NOTE — Transfer of Care (Signed)
Immediate Anesthesia Transfer of Care Note  Patient: Kirk Russell  Procedure(s) Performed: COLONOSCOPY  Patient Location: PACU and Endoscopy Unit  Anesthesia Type:General  Level of Consciousness: drowsy  Airway & Oxygen Therapy: Patient Spontanous Breathing  Post-op Assessment: Report given to RN and Post -op Vital signs reviewed and stable  Post vital signs: Reviewed and stable  Last Vitals:  Vitals Value Taken Time  BP 100/81 05/30/21 1050  Temp 36.1 C 05/30/21 1037  Pulse 71 05/30/21 1051  Resp 17 05/30/21 1051  SpO2 98 % 05/30/21 1051  Vitals shown include unvalidated device data.  Last Pain:  Vitals:   05/30/21 1047  TempSrc:   PainSc: 0-No pain         Complications: No notable events documented.

## 2021-05-30 NOTE — Anesthesia Preprocedure Evaluation (Signed)
Anesthesia Evaluation  Patient identified by MRN, date of birth, ID band Patient awake    Reviewed: Allergy & Precautions, NPO status , Patient's Chart, lab work & pertinent test results  Airway Mallampati: II  TM Distance: >3 FB     Dental  (+) Dental Advidsory Given, Caps, Teeth Intact   Pulmonary neg shortness of breath, neg sleep apnea, COPD,  COPD inhaler, neg recent URI, former smoker,    Pulmonary exam normal        Cardiovascular hypertension, Pt. on medications (-) angina+ CAD and + Cardiac Stents  (-) Past MI Normal cardiovascular exam(-) dysrhythmias (-) Valvular Problems/Murmurs     Neuro/Psych negative neurological ROS  negative psych ROS   GI/Hepatic negative GI ROS, Neg liver ROS,   Endo/Other  diabetes, Well Controlled  Renal/GU negative Renal ROS     Musculoskeletal  (+) Arthritis ,   Abdominal Normal abdominal exam  (+)   Peds  Hematology  (+) Blood dyscrasia, ,   Anesthesia Other Findings Past Medical History: No date: Adenoma of colon No date: Arthritis No date: Celiac artery dissection (HCC) No date: COPD (chronic obstructive pulmonary disease) (HCC) No date: Coronary artery disease No date: Diabetes mellitus without complication (HCC) No date: Diverticulosis No date: Glaucoma No date: Hearing impairment No date: Hypercholesteremia No date: Hypertension No date: Insomnia No date: Meniere syndrome No date: Thrombocytopenia (HCC) No date: Vertigo  Reproductive/Obstetrics                             Anesthesia Physical  Anesthesia Plan  ASA: 3  Anesthesia Plan: General   Post-op Pain Management:    Induction: Intravenous  PONV Risk Score and Plan: 2 and Propofol infusion and TIVA  Airway Management Planned: Nasal Cannula and Natural Airway  Additional Equipment:   Intra-op Plan:   Post-operative Plan:   Informed Consent: I have reviewed the  patients History and Physical, chart, labs and discussed the procedure including the risks, benefits and alternatives for the proposed anesthesia with the patient or authorized representative who has indicated his/her understanding and acceptance.     Dental advisory given  Plan Discussed with: CRNA and Surgeon  Anesthesia Plan Comments:         Anesthesia Quick Evaluation

## 2021-05-30 NOTE — Op Note (Signed)
[facility] Patient Name: Kirk Russell Procedure Date: 05/30/2021 9:43 AM MRN: [id number] Account #: [id number] Date of Birth: February 20, 1946 Admit Type: Outpatient Age: 76 Room: [facility] ENDO ROOM 2 Gender: Male Note Status: Finalized Instrument Name: Colonoscope [id number] Procedure:             Colonoscopy Indications:           Hematochezia Providers:             Annamaria Helling DO, DO Medicines:             Monitored Anesthesia Care Complications:         No immediate complications. Estimated blood loss:                         Minimal. Procedure:             Pre-Anesthesia Assessment:                        - Prior to the procedure, a History and Physical was                         performed, and patient medications and allergies were                         reviewed. The patient is competent. The risks and                         benefits of the procedure and the sedation options and                         risks were discussed with the patient. All questions                         were answered and informed consent was obtained.                         Patient identification and proposed procedure were                         verified by the physician, the nurse, the anesthetist                         and the technician in the endoscopy suite. Mental                         Status Examination: alert and oriented. Airway                         Examination: normal oropharyngeal airway and neck                         mobility. Respiratory Examination: clear to                         auscultation. CV Examination: RRR, no murmurs, no S3                         or S4. Prophylactic Antibiotics: The patient does not  require prophylactic antibiotics. Prior                         Anticoagulants: The patient has taken no previous                         anticoagulant or antiplatelet agents. ASA Grade                          Assessment: III - A patient with severe systemic                         disease. After reviewing the risks and benefits, the                         patient was deemed in satisfactory condition to                         undergo the procedure. The anesthesia plan was to use                         monitored anesthesia care (MAC). Immediately prior to                         administration of medications, the patient was                         re-assessed for adequacy to receive sedatives. The                         heart rate, respiratory rate, oxygen saturations,                         blood pressure, adequacy of pulmonary ventilation, and                         response to care were monitored throughout the                         procedure. The physical status of the patient was                         re-assessed after the procedure.                        After obtaining informed consent, the colonoscope was                         passed under direct vision. Throughout the procedure,                         the patient's blood pressure, pulse, and oxygen                         saturations were monitored continuously. The                         Colonoscope was introduced through the anus and  advanced to the the cecum, identified by appendiceal                         orifice and ileocecal valve. The colonoscopy was                         performed without difficulty. The patient tolerated                         the procedure well. The quality of the bowel                         preparation was evaluated using the BBPS [facility] Bowel                         Preparation Scale) with scores of: Right Colon = 3,                         Transverse Colon = 3 and Left Colon = 3 (entire mucosa                         seen well with no residual staining, small fragments                         of stool or opaque liquid). The total BBPS score                          equals 9. The ileocecal valve, appendiceal orifice,                         and rectum were photographed. Findings:      The perianal and digital rectal examinations were normal. Pertinent       negatives include normal sphincter tone.      A 4 to 5 mm polyp was found in the ascending colon. The polyp was       sessile. The polyp was removed with a cold snare. Resection and       retrieval were complete. Estimated blood loss was minimal.      A single localized non-bleeding erosion was found in the ascending       colon. No stigmata of recent bleeding were seen. Biopsies were taken       with a cold forceps for histology. Estimated blood loss was minimal.      Multiple small-mouthed diverticula were found in the left colon.       Estimated blood loss: none.      Non-bleeding internal hemorrhoids were found during retroflexion. The       hemorrhoids were Grade I (internal hemorrhoids that do not prolapse).       Estimated blood loss: none.      The exam was otherwise without abnormality on direct and retroflexion       views. Impression:            - One 4 to 5 mm polyp in the ascending colon, removed                         with a cold snare. Resected and retrieved.                        -  A single erosion in the ascending colon. Biopsied.                        - Diverticulosis in the left colon.                        - Non-bleeding internal hemorrhoids.                        - The examination was otherwise normal on direct and                         retroflexion views. Recommendation:        - Discharge patient to home.                        - Resume previous diet.                        - Continue present medications.                        - Use sugar-free Metamucil one tablespoon PO daily.                        - Miralax 1 capful (17 grams) in 8 ounces of water PO                         daily.                        - Await pathology results.                        -  Repeat colonoscopy for surveillance based on                         pathology results.                        - Return to GI office as previously scheduled.                        - The findings and recommendations were discussed with                         the patient. Procedure Code(s):     --- Professional ---                        [phone], Colonoscopy, flexible; with removal of                         tumor(s), polyp(s), or other lesion(s) by snare                         technique                        62130, 59, Colonoscopy, flexible; with biopsy, single  or multiple Diagnosis Code(s):     --- Professional ---                        K63.5, Polyp of colon                        K63.3, Ulcer of intestine                        K64.0, First degree hemorrhoids                        K92.1, Melena (includes Hematochezia)                        K57.30, Diverticulosis of large intestine without                         perforation or abscess without bleeding CPT copyright 2019 American Medical Association. All rights reserved. The codes documented in this report are preliminary and upon coder review may  be revised to meet current compliance requirements. Attending Participation:      I personally performed the entire procedure. Volney American, DO Annamaria Helling DO, DO 05/30/2021 10:39:14 AM This report has been signed electronically. Number of Addenda: 0 Note Initiated On: 05/30/2021 9:43 AM Scope Withdrawal Time: 0 hours 15 minutes 4 seconds  Total Procedure Duration: 0 hours 28 minutes 18 seconds  Estimated Blood Loss:  Estimated blood loss was minimal.      [facility]

## 2021-05-31 ENCOUNTER — Encounter: Payer: Self-pay | Admitting: Gastroenterology

## 2021-05-31 NOTE — Anesthesia Postprocedure Evaluation (Signed)
Anesthesia Post Note  Patient: Kirk Russell  Procedure(s) Performed: COLONOSCOPY  Patient location during evaluation: Endoscopy Anesthesia Type: General Level of consciousness: awake and alert Pain management: pain level controlled Vital Signs Assessment: post-procedure vital signs reviewed and stable Respiratory status: spontaneous breathing, nonlabored ventilation, respiratory function stable and patient connected to nasal cannula oxygen Cardiovascular status: blood pressure returned to baseline and stable Postop Assessment: no apparent nausea or vomiting Anesthetic complications: no   No notable events documented.   Last Vitals:  Vitals:   05/30/21 1057 05/30/21 1107  BP: (!) 141/94 139/86  Pulse: 64 64  Resp: 12 15  Temp:    SpO2: 100% 100%    Last Pain:  Vitals:   05/31/21 0733  TempSrc:   PainSc: 0-No pain                 Martha Clan

## 2021-06-01 ENCOUNTER — Other Ambulatory Visit: Payer: Self-pay | Admitting: Family Medicine

## 2021-06-01 LAB — SURGICAL PATHOLOGY

## 2021-06-01 MED ORDER — ALFUZOSIN HCL ER 10 MG PO TB24: 10.0000 mg | ORAL_TABLET | Freq: Every day | ORAL | 3 refills | Status: DC

## 2021-07-19 ENCOUNTER — Other Ambulatory Visit: Payer: Self-pay | Admitting: Pulmonary Disease

## 2021-07-19 ENCOUNTER — Other Ambulatory Visit (HOSPITAL_COMMUNITY): Payer: Self-pay | Admitting: Pulmonary Disease

## 2021-07-19 DIAGNOSIS — J449 Chronic obstructive pulmonary disease, unspecified: Secondary | ICD-10-CM

## 2021-08-02 ENCOUNTER — Ambulatory Visit
Admission: RE | Admit: 2021-08-02 | Discharge: 2021-08-02 | Disposition: A | Discharge status: Home / Self Care | Payer: Medicare Other | Source: Ambulatory Visit | Attending: Pulmonary Disease | Admitting: Pulmonary Disease

## 2021-08-02 DIAGNOSIS — J449 Chronic obstructive pulmonary disease, unspecified: Secondary | ICD-10-CM | POA: Diagnosis present

## 2021-08-22 ENCOUNTER — Ambulatory Visit (INDEPENDENT_AMBULATORY_CARE_PROVIDER_SITE_OTHER): Payer: Medicare Other | Admitting: Urology

## 2021-08-22 ENCOUNTER — Encounter: Payer: Self-pay | Admitting: Urology

## 2021-08-22 VITALS — BP 154/82 | HR 94 | Ht 69.0 in | Wt 205.0 lb

## 2021-08-22 DIAGNOSIS — N401 Enlarged prostate with lower urinary tract symptoms: Secondary | ICD-10-CM

## 2021-08-22 DIAGNOSIS — R3914 Feeling of incomplete bladder emptying: Secondary | ICD-10-CM | POA: Diagnosis not present

## 2021-08-22 LAB — BLADDER SCAN AMB NON-IMAGING: Scan Result: 214

## 2021-08-22 NOTE — Progress Notes (Signed)
? ?08/22/2021 ?9:03 AM  ? ?Kirk Russell ?05/22/1945 ?[id number] ? ?Referring provider: Baxter Hire, MD ?[address] ?[place],  [place] [address] ? ?Chief Complaint  ?Patient presents with  ? Benign Prostatic Hypertrophy  ? ? ?Urologic history: ?1.  BPH with incomplete bladder emptying ?-Alfuzosin daily ?-Baseline PVR <150 mL ?  ?2.  History urethral stricture disease ?-Cystoscopy 09/2018 with moderate lateral lobe enlargement; no stricture identified ? ?HPI: ?76 y.o. male presents for 1 month follow-up. ? ?Doing well since last visit ?No bothersome LUTS ?Denies dysuria, gross hematuria ?Denies flank, abdominal or pelvic pain ?Remains on alfuzosin ? ?PMH: ?Past Medical History:  ?Diagnosis Date  ? Adenoma of colon   ? Arthritis   ? Cancer [facility])   ? 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 Celiac artery dissection (HCC)   ? Complication of anesthesia   ? COPD (chronic obstructive pulmonary disease) ([place])   ? Coronary artery disease   ? Diabetes mellitus without complication ([place])   ? Diverticulosis   ? Glaucoma   ? Hearing impairment   ? Hypercholesteremia   ? Hypertension   ? Insomnia   ? Meniere syndrome   ? Thrombocytopenia ([place])   ? Vertigo   ? Wears hearing aid in left ear   ? ? ?Surgical History: ?Past Surgical History:  ?Procedure Laterality Date  ? ABDOMINAL SURGERY    ? APPENDECTOMY    ? CATARACT EXTRACTION W/PHACO Right 02/04/2020  ? Procedure: CATARACT EXTRACTION PHACO AND INTRAOCULAR LENS PLACEMENT (IOC) RIGHT DIABETIC  6.01  01:11.3;  Surgeon: Leandrew Koyanagi, MD;  Location: [place];  Service: Ophthalmology;  Laterality: Right;  Diabetic - oral meds  ? COLONOSCOPY    ? COLONOSCOPY    ? COLONOSCOPY N/A 05/30/2021  ? Procedure: COLONOSCOPY;  Surgeon: Annamaria Helling, DO;  Location: [facility] ENDOSCOPY;  Service: Gastroenterology;  Laterality: N/A;  DIET CONTROLLED  ? COLONOSCOPY WITH PROPOFOL N/A 01/22/2018  ? Procedure: COLONOSCOPY WITH PROPOFOL;  Surgeon: Lollie Sails, MD;  Location:  [facility] ENDOSCOPY;  Service: Endoscopy;  Laterality: N/A;  ? CORONARY ANGIOPLASTY WITH STENT PLACEMENT    ? ? ?Home Medications:  ?Allergies as of 08/22/2021   ? ?   Reactions  ? Lovastatin   ? Other reaction(s): Muscle Pain ?With higher doses  ? ?  ? ?  ?Medication List  ?  ? ?  ? Accurate as of Aug 22, 2021  9:03 AM. If you have any questions, ask your nurse or doctor.  ?  ?  ? ?  ? ?STOP taking these medications   ? ?silodosin 8 MG Caps capsule ?Commonly known as: RAPAFLO ?Stopped by: Abbie Sons, MD ?  ? ?  ? ?TAKE these medications   ? ?albuterol 108 (90 Base) MCG/ACT inhaler ?Commonly known as: VENTOLIN HFA ?Inhale into the lungs every 6 (six) hours as needed for wheezing or shortness of breath. ?  ?alfuzosin 10 MG 24 hr tablet ?Commonly known as: UROXATRAL ?Take 1 tablet (10 mg total) by mouth daily with breakfast. ?  ?aspirin EC 81 MG tablet ?Take 81 mg by mouth daily. ?  ?Co-Enzyme Q10 200 MG Caps ?Take by mouth. ?  ?dorzolamide-timolol 22.3-6.8 MG/ML ophthalmic solution ?Commonly known as: COSOPT ?1 drop 2 (two) times daily. ?  ?glucosamine-chondroitin 500-400 MG tablet ?Take 3 tablets by mouth daily. ?  ?latanoprost 0.005 % ophthalmic solution ?Commonly known as: XALATAN ?1 drop at bedtime. ?  ?lisinopril 10 MG tablet ?Commonly known as: ZESTRIL ?Take  10 mg by mouth daily. ?  ?lovastatin 20 MG tablet ?Commonly known as: MEVACOR ?Take by mouth. ?  ?metFORMIN 500 MG tablet ?Commonly known as: GLUCOPHAGE ?Take 500 mg by mouth 2 (two) times daily with a meal. ?  ?NEURIVA PO ?Take by mouth daily. ?  ?tadalafil 5 MG tablet ?Commonly known as: CIALIS ?Take 1 tablet (5 mg total) by mouth daily. ?  ?tadalafil 10 MG tablet ?Commonly known as: Cialis ?Take 1 tablet (10 mg total) by mouth as needed for erectile dysfunction. ?  ?umeclidinium-vilanterol 62.5-25 MCG/INH Aepb ?Commonly known as: ANORO ELLIPTA ?Inhale 1 puff into the lungs daily. ?  ? ?  ? ? ?Allergies:  ?Allergies  ?Allergen Reactions  ? Lovastatin   ?   Other reaction(s): Muscle Pain ?With higher doses  ? ? ?Family History: ?Family History  ?Problem Relation Age of Onset  ? Breast cancer Mother   ? Prostate cancer Neg Hx   ? Kidney cancer Neg Hx   ? Bladder Cancer Neg Hx   ? ? ?Social History:  reports that he quit smoking about 33 years ago. His smoking use included cigarettes. He has quit using smokeless tobacco. He reports current alcohol use of about 3.0 standard drinks per week. He reports that he does not use drugs. ? ? ?Physical Exam: ?BP (!) 154/82   Pulse 94   Ht '5\' 9"'$  (1.753 m)   Wt 205 lb (93 kg)   BMI 30.27 kg/m?   ?Constitutional:  Alert and oriented, No acute distress. ?Psychiatric: Normal mood and affect. ? ? ?Assessment & Plan:   ? ?1.  BPH with LUTS ?PVR today 214 mL ?Continue alfuzosin ?Surgical options were again reviewed including UroLift and TURP.  He desires to continue medical management ?1 year follow-up with PVR ? ?2.  Incomplete bladder emptying ?Slightly increased above baseline ?PVR today 214 mL ? ? ?Abbie Sons, MD ? ?[place] ?[address], [address] ?[place], [place] [address] ?[phone] ? ?

## 2022-05-17 ENCOUNTER — Other Ambulatory Visit: Payer: Self-pay | Admitting: Otolaryngology

## 2022-05-17 DIAGNOSIS — K219 Gastro-esophageal reflux disease without esophagitis: Secondary | ICD-10-CM

## 2022-05-17 DIAGNOSIS — R1310 Dysphagia, unspecified: Secondary | ICD-10-CM

## 2022-05-25 ENCOUNTER — Other Ambulatory Visit: Payer: Self-pay | Admitting: Otolaryngology

## 2022-05-29 ENCOUNTER — Ambulatory Visit: Payer: Medicare Other

## 2022-05-29 ENCOUNTER — Other Ambulatory Visit (HOSPITAL_BASED_OUTPATIENT_CLINIC_OR_DEPARTMENT_OTHER): Payer: Self-pay | Admitting: Otolaryngology

## 2022-05-29 DIAGNOSIS — R1314 Dysphagia, pharyngoesophageal phase: Secondary | ICD-10-CM

## 2022-05-30 LAB — SURGICAL PATHOLOGY

## 2022-06-05 ENCOUNTER — Ambulatory Visit
Admission: RE | Admit: 2022-06-05 | Discharge: 2022-06-05 | Disposition: A | Discharge status: Home / Self Care | Payer: Medicare Other | Source: Ambulatory Visit | Attending: Otolaryngology | Admitting: Otolaryngology

## 2022-06-05 DIAGNOSIS — R1314 Dysphagia, pharyngoesophageal phase: Secondary | ICD-10-CM

## 2022-07-10 ENCOUNTER — Other Ambulatory Visit: Payer: Self-pay | Admitting: Urology

## 2022-08-23 ENCOUNTER — Encounter: Payer: Self-pay | Admitting: Urology

## 2022-08-23 ENCOUNTER — Ambulatory Visit (INDEPENDENT_AMBULATORY_CARE_PROVIDER_SITE_OTHER): Payer: Medicare Other | Admitting: Urology

## 2022-08-23 VITALS — BP 125/76 | HR 79 | Ht 69.0 in | Wt 199.4 lb

## 2022-08-23 DIAGNOSIS — R339 Retention of urine, unspecified: Secondary | ICD-10-CM | POA: Diagnosis not present

## 2022-08-23 DIAGNOSIS — N401 Enlarged prostate with lower urinary tract symptoms: Secondary | ICD-10-CM | POA: Diagnosis not present

## 2022-08-23 LAB — BLADDER SCAN AMB NON-IMAGING: Scan Result: 81

## 2022-08-23 MED ORDER — ALFUZOSIN HCL ER 10 MG PO TB24: 10.0000 mg | ORAL_TABLET | Freq: Every day | ORAL | 3 refills | Status: DC

## 2022-08-23 NOTE — Progress Notes (Signed)
I, Duke Salvia, acting as a Neurosurgeon for Kirk Altes, MD., have documented all relevant documentation on the behalf of Kirk Altes, MD, as directed by  Kirk Altes, MD while in the presence of Kirk Altes, MD.   08/23/2022 10:34 AM   Marko Plume Seher 11-02-1945 [id number]  Referring provider: Gracelyn Nurse, MD [address] [place],  [place] [address]  Chief Complaint  Patient presents with   Benign Prostatic Hypertrophy    Urologic history: 1.  BPH with incomplete bladder emptying -Alfuzosin daily -Baseline PVR <150 mL   2.  History urethral stricture disease -Cystoscopy 09/2018 with moderate lateral lobe enlargement; no stricture identified  HPI: 77 y.o. male presenting for annual follow-up.  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   Celiac artery dissection (HCC)    Complication of anesthesia    COPD (chronic obstructive pulmonary disease) (HCC)    Coronary artery disease    Diabetes mellitus without complication (HCC)    Diverticulosis    Glaucoma    Hearing impairment    Hypercholesteremia    Hypertension    Insomnia    Meniere syndrome    Thrombocytopenia (HCC)    Vertigo    Wears hearing aid in left ear     Surgical History: Past Surgical History:  Procedure Laterality Date   ABDOMINAL SURGERY     APPENDECTOMY     CATARACT EXTRACTION W/PHACO Right 02/04/2020   Procedure: CATARACT EXTRACTION PHACO AND INTRAOCULAR LENS PLACEMENT (IOC) RIGHT DIABETIC  6.01  01:11.3;  Surgeon: Lockie Mola, MD;  Location: [facility];  Service: Ophthalmology;  Laterality: Right;  Diabetic - oral meds   COLONOSCOPY     COLONOSCOPY     COLONOSCOPY N/A 05/30/2021   Procedure: COLONOSCOPY;  Surgeon: Jaynie Collins, DO;  Location: [facility] ENDOSCOPY;  Service: Gastroenterology;  Laterality: N/A;  DIET CONTROLLED   COLONOSCOPY WITH PROPOFOL N/A 01/22/2018   Procedure: COLONOSCOPY WITH PROPOFOL;  Surgeon: Christena Deem, MD;  Location: [facility] ENDOSCOPY;  Service: Endoscopy;  Laterality: N/A;   CORONARY ANGIOPLASTY WITH STENT PLACEMENT      Home Medications:  Allergies as of 08/23/2022       Reactions   Lovastatin    Other reaction(s): Muscle Pain With higher doses        Medication List        Accurate as of Aug 23, 2022 10:34 AM. If you have any questions, ask your nurse or doctor.          albuterol 108 (90 Base) MCG/ACT inhaler Commonly known as: VENTOLIN HFA Inhale into the lungs every 6 (six) hours as needed for wheezing or shortness of breath.   alfuzosin 10 MG 24 hr tablet Commonly known as: UROXATRAL Take 1 tablet (10 mg total) by mouth daily with breakfast.   aspirin EC 81 MG tablet Take 81 mg by mouth daily.   Co-Enzyme Q10 200 MG Caps Take by mouth.   dorzolamide-timolol 2-0.5 % ophthalmic solution Commonly known as: COSOPT 1 drop 2 (two) times daily.   glucosamine-chondroitin 500-400 MG tablet Take 3 tablets by mouth daily.   latanoprost 0.005 % ophthalmic solution Commonly known as: XALATAN 1 drop at bedtime.  lisinopril 10 MG tablet Commonly known as: ZESTRIL Take 10 mg by mouth daily.   lovastatin 20 MG tablet Commonly known as: MEVACOR Take by mouth.   metFORMIN 500 MG tablet Commonly known as: GLUCOPHAGE Take 500 mg by mouth 2 (two) times daily with a meal.   NEURIVA PO Take by mouth daily.   tadalafil 5 MG tablet Commonly known as: CIALIS Take 1 tablet (5 mg total) by mouth daily.   tadalafil 10 MG tablet Commonly known as: Cialis Take 1 tablet (10 mg total) by mouth as needed for erectile dysfunction.   umeclidinium-vilanterol 62.5-25 MCG/INH Aepb Commonly known as: ANORO ELLIPTA Inhale 1 puff into the lungs daily.         Allergies:  Allergies  Allergen Reactions   Lovastatin     Other reaction(s): Muscle Pain With higher doses    Family History: Family History  Problem Relation Age of Onset   Breast cancer Mother    Prostate cancer Neg Hx    Kidney cancer Neg Hx    Bladder Cancer Neg Hx     Social History:  reports that he quit smoking about 34 years ago. His smoking use included cigarettes. He has quit using smokeless tobacco. He reports current alcohol use of about 3.0 standard drinks of alcohol per week. He reports that he does not use drugs.   Physical Exam: BP 125/76   Pulse 79   Ht 5\' 9"  (1.753 m)   Wt 199 lb 7 oz (90.5 kg)   BMI 29.45 kg/m   Constitutional:  Alert and oriented, No acute distress. Psychiatric: Normal mood and affect.   Assessment & Plan:    1.  BPH with LUTS PVR today improved at 81 mL. Continue Alfuzosin-refill sent to pharmacy. 1 year follow up with PVR.  2.  Incomplete bladder emptying Improved.   I have reviewed the above documentation for accuracy and completeness, and I agree with the above.   Kirk Altes, MD  [facility] [address] [place], [place] [address] [phone]

## 2022-11-04 ENCOUNTER — Ambulatory Visit
Admission: EM | Admit: 2022-11-04 | Discharge: 2022-11-04 | Disposition: A | Discharge status: Home / Self Care | Payer: Medicare Other | Attending: Family Medicine | Admitting: Family Medicine

## 2022-11-04 ENCOUNTER — Ambulatory Visit: Payer: Medicare Other

## 2022-11-04 DIAGNOSIS — M79641 Pain in right hand: Secondary | ICD-10-CM

## 2022-11-04 DIAGNOSIS — S66901A Unspecified injury of unspecified muscle, fascia and tendon at wrist and hand level, right hand, initial encounter: Secondary | ICD-10-CM

## 2022-11-04 DIAGNOSIS — L03113 Cellulitis of right upper limb: Secondary | ICD-10-CM

## 2022-11-04 MED ORDER — DOXYCYCLINE HYCLATE 100 MG PO CAPS: 100.0000 mg | ORAL_CAPSULE | Freq: Two times a day (BID) | ORAL | 0 refills | Status: DC

## 2022-11-04 NOTE — Discharge Instructions (Addendum)
Your xray did not show any infection of your bone. If you develop a fever while taking antibiotics, go to the emergency department.   Stop by the pharmacy to pick up your prescriptions.  Follow up with your primary care provider as needed.

## 2022-11-04 NOTE — ED Triage Notes (Signed)
Patient presents to [facility] for right hand injury. Reports hand swelling since yesterday. States he hit his hand against door frame. Treating with neosporin.

## 2022-11-04 NOTE — ED Provider Notes (Signed)
[facility]    CSN: [id number] Arrival date & time: 11/04/22  1124      History   Chief Complaint Chief Complaint  Patient presents with   Hand Injury    HPI  HPI [name] is a 77 y.o. male.   [name] presents for right hand pain after banging his hand on something about a month ago. There was a wound that kept opening up on the top of his hand every time he put his hand in his pocket. Yesterday, he hit his hand on a door frame while moving furniture  then his hand started hurting and swelling. He use to be on blood thinners but he stopped taking them as they made him bleed every time he hit something. No other injuries or concerns. He is left handed.       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   Celiac artery dissection (HCC)    Complication of anesthesia    COPD (chronic obstructive pulmonary disease) (HCC)    Coronary artery disease    Diabetes mellitus without complication (HCC)    Diverticulosis    Glaucoma    Hearing impairment    Hypercholesteremia    Hypertension    Insomnia    Meniere syndrome    Thrombocytopenia (HCC)    Vertigo    Wears hearing aid in left ear     Patient Active Problem List   Diagnosis Date Noted   Low back pain 08/13/2018   SOBOE (shortness of breath on exertion) 03/27/2018   Change in bowel habits 03/30/2017   Epididymoorchitis 12/12/2016   Dysuria 09/15/2016   Postprocedural bulbous urethral stricture 08/10/2016   Benign localized hyperplasia of prostate with urinary obstruction 07/21/2016   History of urethral stricture 07/21/2016   Incomplete emptying of bladder 07/21/2016   Weak urinary stream 07/21/2016   Nocturia associated with benign prostatic hyperplasia 06/13/2016   Celiac artery dissection (HCC) 02/16/2016   Diabetes mellitus type 2, uncomplicated (HCC) 02/16/2016   Meniere syndrome 02/16/2016   Thrombocytopenia  (HCC) 02/16/2016   Vertigo 02/16/2016   Mixed hyperlipidemia 02/24/2015   Benign essential hypertension 08/14/2014   Personal history of skin cancer 02/16/2014   CAD (coronary artery disease) 10/14/2011    Past Surgical History:  Procedure Laterality Date   ABDOMINAL SURGERY     APPENDECTOMY     CATARACT EXTRACTION W/PHACO Right 02/04/2020   Procedure: CATARACT EXTRACTION PHACO AND INTRAOCULAR LENS PLACEMENT (IOC) RIGHT DIABETIC  6.01  01:11.3;  Surgeon: Lockie Mola, MD;  Location: [facility];  Service: Ophthalmology;  Laterality: Right;  Diabetic - oral meds   COLONOSCOPY     COLONOSCOPY     COLONOSCOPY N/A 05/30/2021   Procedure: COLONOSCOPY;  Surgeon: Jaynie Collins, DO;  Location: [facility] ENDOSCOPY;  Service: Gastroenterology;  Laterality: N/A;  DIET CONTROLLED   COLONOSCOPY WITH PROPOFOL N/A 01/22/2018   Procedure: COLONOSCOPY WITH PROPOFOL;  Surgeon: Christena Deem, MD;  Location: [facility] ENDOSCOPY;  Service: Endoscopy;  Laterality: N/A;   CORONARY ANGIOPLASTY WITH STENT PLACEMENT         Home Medications    Prior to Admission medications   Medication Sig Start Date End Date Taking? Authorizing Provider  doxycycline (VIBRAMYCIN) 100 MG capsule Take 1 capsule (100 mg total) by mouth 2 (two) times daily. 11/04/22  Yes Ramiya Delahunty, Seward Meth, DO  albuterol (VENTOLIN HFA)  108 (90 Base) MCG/ACT inhaler Inhale into the lungs every 6 (six) hours as needed for wheezing or shortness of breath.    [provider]  alfuzosin (UROXATRAL) 10 MG 24 hr tablet Take 1 tablet (10 mg total) by mouth daily with breakfast. 08/23/22   Stoioff, Verna Czech, MD  aspirin EC 81 MG tablet Take 81 mg by mouth daily.    [provider]  Co-Enzyme Q10 200 MG CAPS Take by mouth.    [provider]  dorzolamide-timolol (COSOPT) 22.3-6.8 MG/ML ophthalmic solution 1 drop 2 (two) times daily.    [provider]  glucosamine-chondroitin 500-400 MG tablet Take 3  tablets by mouth daily.    [provider]  latanoprost (XALATAN) 0.005 % ophthalmic solution 1 drop at bedtime.    [provider]  lisinopril (PRINIVIL,ZESTRIL) 10 MG tablet Take 10 mg by mouth daily.    [provider]  lovastatin (MEVACOR) 20 MG tablet Take by mouth. 08/25/15   [provider]  metFORMIN (GLUCOPHAGE) 500 MG tablet Take 500 mg by mouth 2 (two) times daily with a meal.    [provider]  Misc Natural Products (NEURIVA PO) Take by mouth daily.    [provider]  tadalafil (CIALIS) 10 MG tablet Take 1 tablet (10 mg total) by mouth as needed for erectile dysfunction. 04/19/20   Vaillancourt, Lelon Mast, PA-C  tadalafil (CIALIS) 5 MG tablet Take 1 tablet (5 mg total) by mouth daily. 04/19/20   Vaillancourt, Lelon Mast, PA-C  umeclidinium-vilanterol (ANORO ELLIPTA) 62.5-25 MCG/INH AEPB Inhale 1 puff into the lungs daily.    [provider]    Family History Family History  Problem Relation Age of Onset   Breast cancer Mother    Prostate cancer Neg Hx    Kidney cancer Neg Hx    Bladder Cancer Neg Hx     Social History Social History   Tobacco Use   Smoking status: Former    Current packs/day: 0.00    Types: Cigarettes    Quit date: 1990    Years since quitting: 34.5   Smokeless tobacco: Former  Building services engineer status: Never Used  Substance Use Topics   Alcohol use: Yes    Alcohol/week: 3.0 standard drinks of alcohol    Types: 3 Cans of beer per week   Drug use: No     Allergies   Lovastatin   Review of Systems Review of Systems: negative unless otherwise stated in HPI.      Physical Exam Triage Vital Signs ED Triage Vitals [11/04/22 1135]  Encounter Vitals Group     BP (!) 145/75     Systolic BP Percentile      Diastolic BP Percentile      Pulse Rate 71     Resp 16     Temp 98.6 F (37 C)     Temp Source Oral     SpO2 98 %     Weight      Height      Head Circumference       Peak Flow      Pain Score 0     Pain Loc      Pain Education      Exclude from Growth Chart    No data found.  Updated Vital Signs BP (!) 145/75 (BP Location: Left Arm)   Pulse 71   Temp 98.6 F (37 C) (Oral)   Resp 16   SpO2 98%  Visual Acuity Right Eye Distance:   Left Eye Distance:   Bilateral Distance:    Right Eye Near:   Left Eye Near:    Bilateral Near:     Physical Exam GEN: well appearing male in no acute distress  CVS: well perfused, strong radial pulse   RESP: speaking in full sentences without pause, no respiratory distress  MSK: Right Hand: Inspection: No obvious deformity b/l. + moderate swelling, +erythema with bruising; central dorsum  wound with yellow crusting and scaling Palpation: TTP overlying the ventral surface  ROM: good ROM of the digits and wrist b/l. Fully able to extend but limited flexion due to edema  Strength: 5/5 strength in the forearm, wrist and interosseus muscles b/l Neurovascular: NV intact b/l    UC Treatments / Results  Labs (all labs ordered are listed, but only abnormal results are displayed) Labs Reviewed - No data to display  EKG   Radiology DG Hand Complete Right  Result Date: 11/04/2022 CLINICAL DATA:  Hand swelling.  Patient had a door yesterday EXAM: RIGHT HAND - COMPLETE 3 VIEW COMPARISON:  None Available. FINDINGS: Diffuse soft tissue swelling is present Kirk the dorsum of the hand. No underlying fracture is present. Chondrocalcinosis is present at the TFCC. Mild degenerative changes are present at the first MCP joint. No acute osseous abnormalities are present. IMPRESSION: 1. Diffuse soft tissue swelling Kirk the dorsum of the hand without underlying fracture. 2. 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 Electronically Signed   By: Marin Roberts M.D.   On: 11/04/2022 13:05     Procedures Procedures (including critical care time)  Medications Ordered in UC Medications - No data to display  Initial Impression /  Assessment and Plan / UC Course  I have reviewed the triage vital signs and the nursing notes.  Pertinent labs & imaging results that were available during my care of the patient were reviewed by me and considered in my medical decision making (see chart for details).      Pt is a 77 y.o.  male with ongoing right hand wound with acute pain and edema after banging hand on door frame yesterday.  On exam he has a wound on the dorsum of his hand moderate hand swelling preventing his phalanges to flex fully.  Obtained right hand plain films.  Personally interpreted by me were unremarkable for fracture or dislocation.  No significant cortical irregularity suggestive of osteonecrosis.  Radiologist report reviewed and additionally notes diffuse soft tissue swelling Kirk the dorsum of the hand without underlying fracture.  Callus [place] stenosis at the triangular fibrocartilage complex.  I suspect cellulitis is the underlying cause of his erythema and swelling.  Treat with doxycycline which will also cover for MRSA.  ED precautions given and patient voiced understanding.  Continue Kirk-the-counter analgesics as needed for pain.  Return and ED precautions given. Understanding voiced. Discussed MDM, treatment plan and plan for follow-up with patient  who agrees with plan.   Final Clinical Impressions(s) / UC Diagnoses   Final diagnoses:  Right hand pain  Cellulitis of right hand     Discharge Instructions      Your xray did not show any infection of your bone. If you develop a fever while taking antibiotics, go to the emergency department.   Stop by the pharmacy to pick up your prescriptions.  Follow up with your primary care provider as needed.      ED Prescriptions     Medication Sig Dispense Auth. Provider  doxycycline (VIBRAMYCIN) 100 MG capsule Take 1 capsule (100 mg total) by mouth 2 (two) times daily. 20 capsule Katha Cabal, DO      PDMP not reviewed this encounter.   Katha Cabal, DO 11/04/22 1315

## 2022-12-13 ENCOUNTER — Other Ambulatory Visit: Payer: Self-pay

## 2022-12-13 ENCOUNTER — Emergency Department: Payer: Medicare Other

## 2022-12-13 ENCOUNTER — Observation Stay
Admission: EM | Admit: 2022-12-13 | Discharge: 2022-12-14 | Disposition: A | Discharge status: Home / Self Care | Payer: Medicare Other | Attending: Osteopathic Medicine | Admitting: Osteopathic Medicine

## 2022-12-13 DIAGNOSIS — R0789 Other chest pain: Secondary | ICD-10-CM | POA: Diagnosis present

## 2022-12-13 DIAGNOSIS — Z87891 Personal history of nicotine dependence: Secondary | ICD-10-CM | POA: Diagnosis not present

## 2022-12-13 DIAGNOSIS — I200 Unstable angina: Secondary | ICD-10-CM

## 2022-12-13 DIAGNOSIS — R079 Chest pain, unspecified: Secondary | ICD-10-CM | POA: Insufficient documentation

## 2022-12-13 DIAGNOSIS — Z7984 Long term (current) use of oral hypoglycemic drugs: Secondary | ICD-10-CM | POA: Diagnosis not present

## 2022-12-13 DIAGNOSIS — I959 Hypotension, unspecified: Secondary | ICD-10-CM | POA: Insufficient documentation

## 2022-12-13 DIAGNOSIS — I10 Essential (primary) hypertension: Secondary | ICD-10-CM | POA: Diagnosis not present

## 2022-12-13 DIAGNOSIS — E119 Type 2 diabetes mellitus without complications: Secondary | ICD-10-CM

## 2022-12-13 DIAGNOSIS — Z7982 Long term (current) use of aspirin: Secondary | ICD-10-CM | POA: Diagnosis not present

## 2022-12-13 DIAGNOSIS — I25110 Atherosclerotic heart disease of native coronary artery with unstable angina pectoris: Principal | ICD-10-CM | POA: Insufficient documentation

## 2022-12-13 DIAGNOSIS — R072 Precordial pain: Secondary | ICD-10-CM | POA: Diagnosis present

## 2022-12-13 DIAGNOSIS — Z85828 Personal history of other malignant neoplasm of skin: Secondary | ICD-10-CM | POA: Diagnosis not present

## 2022-12-13 DIAGNOSIS — J449 Chronic obstructive pulmonary disease, unspecified: Secondary | ICD-10-CM | POA: Diagnosis not present

## 2022-12-13 DIAGNOSIS — I2510 Atherosclerotic heart disease of native coronary artery without angina pectoris: Secondary | ICD-10-CM | POA: Diagnosis present

## 2022-12-13 LAB — CBC
HCT: 37.3 % — ABNORMAL LOW (ref 39.0–52.0)
Hemoglobin: 13.2 g/dL (ref 13.0–17.0)
MCH: 34.1 pg — ABNORMAL HIGH (ref 26.0–34.0)
MCHC: 35.4 g/dL (ref 30.0–36.0)
MCV: 96.4 fL (ref 80.0–100.0)
Platelets: 124 10*3/uL — ABNORMAL LOW (ref 150–400)
RBC: 3.87 MIL/uL — ABNORMAL LOW (ref 4.22–5.81)
RDW: 12.1 % (ref 11.5–15.5)
WBC: 6.3 10*3/uL (ref 4.0–10.5)
nRBC: 0 % (ref 0.0–0.2)

## 2022-12-13 LAB — BASIC METABOLIC PANEL
Anion gap: 8 (ref 5–15)
BUN: 14 mg/dL (ref 8–23)
CO2: 23 mmol/L (ref 22–32)
Calcium: 8.6 mg/dL — ABNORMAL LOW (ref 8.9–10.3)
Chloride: 98 mmol/L (ref 98–111)
Creatinine, Ser: 0.9 mg/dL (ref 0.61–1.24)
GFR, Estimated: >60 mL/min (ref >60)
Glucose, Bld: 138 mg/dL — ABNORMAL HIGH (ref 70–99)
Potassium: 4 mmol/L (ref 3.5–5.1)
Sodium: 129 mmol/L — ABNORMAL LOW (ref 135–145)

## 2022-12-13 LAB — TROPONIN I (HIGH SENSITIVITY): Troponin I (High Sensitivity): 8 ng/L (ref <18)

## 2022-12-13 MED ORDER — SODIUM CHLORIDE 0.9 % IV BOLUS
1000.0000 mL | Freq: Once | INTRAVENOUS | Status: AC
  Administered 2022-12-14: 1000 mL via INTRAVENOUS

## 2022-12-13 NOTE — ED Provider Notes (Signed)
[facility] Provider Note    Event Date/Time   First MD Initiated Contact with Patient 12/13/22 [phone]     (approximate)   History   Chest Pain   HPI  Tylynn Dacquisto is a 77 y.o. male with history of T2DM, CAD, HTN, COPD presenting to the emergency department for evaluation of chest pain.  Patient reports that for the last several weeks he has had intermittent episodes of chest pressure, nonradiating.  He saw cardiology on 8/7 and has an echocardiogram scheduled for Friday for follow-up.  This evening around 8 PM, had a 20-minute episode of increased chest pressure leading him to present to the ER.  Took 4 baby aspirin.  Currently chest pain-free.  Denies shortness of breath, dizziness, nausea.      Physical Exam   Triage Vital Signs: ED Triage Vitals  Encounter Vitals Group     BP 12/13/22 2240 98/71     Systolic BP Percentile --      Diastolic BP Percentile --      Pulse Rate 12/13/22 2240 80     Resp 12/13/22 2240 17     Temp 12/13/22 2240 98.7 F (37.1 C)     Temp Source 12/13/22 2240 Oral     SpO2 12/13/22 2240 96 %     Weight 12/13/22 2232 200 lb (90.7 kg)     Height 12/13/22 2232 5\' 9"  (1.753 m)     Head Circumference --      Peak Flow --      Pain Score 12/13/22 2232 0     Pain Loc --      Pain Education --      Exclude from Growth Chart --     Most recent vital signs: Vitals:   12/14/22 0230 12/14/22 0315  BP: 112/73   Pulse: (!) 56 86  Resp: 15 (!) 21  Temp:    SpO2: 100% 99%     General: Awake, interactive  CV:  Regular rate, good peripheral perfusion.  Resp:  Lungs clear, unlabored respirations.  Abd:  Soft, nondistended.  Neuro:  Symmetric facial movement, fluid speech   ED Results / Procedures / Treatments   Labs (all labs ordered are listed, but only abnormal results are displayed) Labs Reviewed  BASIC METABOLIC PANEL - Abnormal; Notable for the following components:      Result Value   Sodium 129 (*)     Glucose, Bld 138 (*)    Calcium 8.6 (*)    All other components within normal limits  CBC - Abnormal; Notable for the following components:   RBC 3.87 (*)    HCT 37.3 (*)    MCH 34.1 (*)    Platelets 124 (*)    All other components within normal limits  D-DIMER, QUANTITATIVE - Abnormal; Notable for the following components:   D-Dimer, Quant 0.68 (*)    All other components within normal limits  HEPATIC FUNCTION PANEL - Abnormal; Notable for the following components:   Albumin 3.2 (*)    All other components within normal limits  LIPASE, BLOOD  LIPOPROTEIN A (LPA)  HEMOGLOBIN A1C  CBC  CREATININE, SERUM  TROPONIN I (HIGH SENSITIVITY)  TROPONIN I (HIGH SENSITIVITY)     EKG EKG independently reviewed interpreted by myself (ER attending) demonstrates:  EKG demonstrates sinus rhythm at a rate of 80, PR 140, QRS of D2, QTc 415, no acute ST changes  HEART SCORE:    History:  Highly Suspicious - 2                               Moderately Suspicious - 1                               Slightly Suspicious - 0  ECG                      Significant ST-deviation - 2                               Nonspecific repolarization - 1                               Normal - 0  Age                      > 77 Years Old         - 2                               >68 and <74 Years Old - 1                               <48 years old - 0  Risk Factors       3 risk factors or history or known disease - 2                              1 or 2 risk factors - 1                              No risk factors known - 0  Troponin            3 times the normal limit - 2                              >1 and <3 times the normal limit - 1                              1 times the normal limit - 0 Total Score:     5  RADIOLOGY Imaging independently reviewed and interpreted by myself demonstrates:  CXR without focal consolidation  PROCEDURES:  Critical Care performed:  No  Procedures   MEDICATIONS ORDERED IN ED: Medications  pravastatin (PRAVACHOL) tablet 40 mg (has no administration in time range)  aspirin EC tablet 81 mg (has no administration in time range)  nitroGLYCERIN (NITROSTAT) SL tablet 0.4 mg (has no administration in time range)  acetaminophen (TYLENOL) tablet 650 mg (has no administration in time range)  ondansetron (ZOFRAN) injection 4 mg (has no administration in time range)  enoxaparin (LOVENOX) injection 40 mg (has no administration in time range)  insulin aspart (novoLOG) injection 0-15 Units (has no administration in time range)  insulin aspart (novoLOG) injection 0-5 Units (has no administration in time range)  albuterol (PROVENTIL) (  2.5 MG/3ML) 0.083% nebulizer solution 2.5 mg (has no administration in time range)  sodium chloride 0.9 % bolus 1,000 mL (0 mLs Intravenous Stopped 12/14/22 0112)  ondansetron (ZOFRAN) injection 4 mg (4 mg Intravenous Given 12/14/22 0103)  fentaNYL (SUBLIMAZE) injection 50 mcg (50 mcg Intravenous Given 12/14/22 0110)  iohexol (OMNIPAQUE) 350 MG/ML injection 75 mL (75 mLs Intravenous Contrast Given 12/14/22 0303)     IMPRESSION / MDM / ASSESSMENT AND PLAN / ED COURSE  I reviewed the triage vital signs and the nursing notes.  Differential diagnosis includes, but is not limited to, ACS, PE, pneumothorax, pneumonia, COPD exacerbation  Patient's presentation is most consistent with acute presentation with potential threat to life or bodily function.  77 year old male presenting to the emergency department for chest pain in the setting of several weeks of similar symptoms.  Last in triage without severe derangement.  Slightly worsened hyponatremia noted, ordered for IV fluids.  Initial troponin negative.  Given worsening pain a few hours ago, will obtain repeat.   While awaiting repeat troponin, patient did have recurrence of chest pain, this time with associated shortness of breath and nausea.  Has already  received aspirin.  Borderline low blood pressure, so we will hold off on sublingual nitro, but trial fentanyl for pain control.  Added on some additional labs.  On reevaluation, patient with improved symptoms.  Normal lipase and LFTs without severe derangement, but D-dimer elevated at 0.68.  CT of the chest obtained which was without evidence of PE, but did demonstrate extensive coronary artery calcifications.  Moderate risk heart score.  With his worsening episodes of chest pain, do think he is appropriate for admission for further evaluation.  Will reach out to hospitalist team.  Reviewed with Dr. Para March with hospitalist with team. She will come evaluate the patient for anticipated admission.    FINAL CLINICAL IMPRESSION(S) / ED DIAGNOSES   Final diagnoses:  Acute chest pain     Rx / DC Orders   ED Discharge Orders     None        Note:  This document was prepared using Dragon voice recognition software and may include unintentional dictation errors.   Trinna Post, MD 12/14/22 [phone]

## 2022-12-13 NOTE — ED Triage Notes (Addendum)
Pt to ED via [facility] from home c/o chest pain that has been going on/off for about a week. Pt having left sided chest pain that is nonradiating and feels like pressure. Pt denies SOB, dizziness, N/V. Hx of COPD and stent. Pt took 4 baby aspirin before EMS arrival. No chest pain at this time.  146/71 96% RA 92HR 120CBG

## 2022-12-14 ENCOUNTER — Emergency Department: Payer: Medicare Other

## 2022-12-14 ENCOUNTER — Other Ambulatory Visit: Payer: Self-pay

## 2022-12-14 DIAGNOSIS — I25110 Atherosclerotic heart disease of native coronary artery with unstable angina pectoris: Secondary | ICD-10-CM | POA: Diagnosis not present

## 2022-12-14 DIAGNOSIS — I200 Unstable angina: Secondary | ICD-10-CM | POA: Diagnosis not present

## 2022-12-14 DIAGNOSIS — R079 Chest pain, unspecified: Secondary | ICD-10-CM | POA: Insufficient documentation

## 2022-12-14 DIAGNOSIS — I959 Hypotension, unspecified: Secondary | ICD-10-CM

## 2022-12-14 DIAGNOSIS — J449 Chronic obstructive pulmonary disease, unspecified: Secondary | ICD-10-CM | POA: Insufficient documentation

## 2022-12-14 DIAGNOSIS — R072 Precordial pain: Secondary | ICD-10-CM | POA: Diagnosis present

## 2022-12-14 LAB — CBC
HCT: 35 % — ABNORMAL LOW (ref 39.0–52.0)
Hemoglobin: 12.3 g/dL — ABNORMAL LOW (ref 13.0–17.0)
MCH: 34 pg (ref 26.0–34.0)
MCHC: 35.1 g/dL (ref 30.0–36.0)
MCV: 96.7 fL (ref 80.0–100.0)
Platelets: 102 10*3/uL — ABNORMAL LOW (ref 150–400)
RBC: 3.62 MIL/uL — ABNORMAL LOW (ref 4.22–5.81)
RDW: 12 % (ref 11.5–15.5)
WBC: 4.1 10*3/uL (ref 4.0–10.5)
nRBC: 0.7 % — ABNORMAL HIGH (ref 0.0–0.2)

## 2022-12-14 LAB — HEPATIC FUNCTION PANEL
ALT: 18 U/L (ref 0–44)
AST: 15 U/L (ref 15–41)
Albumin: 3.2 g/dL — ABNORMAL LOW (ref 3.5–5.0)
Alkaline Phosphatase: 42 U/L (ref 38–126)
Bilirubin, Direct: 0.1 mg/dL (ref 0.0–0.2)
Indirect Bilirubin: 0.6 mg/dL (ref 0.3–0.9)
Total Bilirubin: 0.7 mg/dL (ref 0.3–1.2)
Total Protein: 6.6 g/dL (ref 6.5–8.1)

## 2022-12-14 LAB — D-DIMER, QUANTITATIVE: D-Dimer, Quant: 0.68 ug{FEU}/mL — ABNORMAL HIGH (ref 0.00–0.50)

## 2022-12-14 LAB — TROPONIN I (HIGH SENSITIVITY): Troponin I (High Sensitivity): 12 ng/L (ref <18)

## 2022-12-14 LAB — HEMOGLOBIN A1C
Hgb A1c MFr Bld: 6.1 % — ABNORMAL HIGH (ref 4.8–5.6)
Mean Plasma Glucose: 128.37 mg/dL

## 2022-12-14 LAB — LIPASE, BLOOD: Lipase: 33 U/L (ref 11–51)

## 2022-12-14 LAB — CREATININE, SERUM
Creatinine, Ser: 0.79 mg/dL (ref 0.61–1.24)
GFR, Estimated: >60 mL/min (ref >60)

## 2022-12-14 LAB — CBG MONITORING, ED: Glucose-Capillary: 116 mg/dL — ABNORMAL HIGH (ref 70–99)

## 2022-12-14 MED ORDER — RANOLAZINE ER 500 MG PO TB12
500.0000 mg | ORAL_TABLET | Freq: Two times a day (BID) | ORAL | Status: DC
  Filled 2022-12-14: qty 1

## 2022-12-14 MED ORDER — ASPIRIN 81 MG PO TBEC: 81.0000 mg | DELAYED_RELEASE_TABLET | Freq: Every day | ORAL | Status: DC

## 2022-12-14 MED ORDER — ENOXAPARIN SODIUM 40 MG/0.4ML IJ SOSY: 40.0000 mg | PREFILLED_SYRINGE | INTRAMUSCULAR | Status: DC

## 2022-12-14 MED ORDER — ALBUTEROL SULFATE (2.5 MG/3ML) 0.083% IN NEBU: 2.5000 mg | INHALATION_SOLUTION | Freq: Four times a day (QID) | RESPIRATORY_TRACT | Status: DC | PRN

## 2022-12-14 MED ORDER — IOHEXOL 350 MG/ML SOLN
75.0000 mL | Freq: Once | INTRAVENOUS | Status: AC | PRN
  Administered 2022-12-14: 75 mL via INTRAVENOUS

## 2022-12-14 MED ORDER — RANOLAZINE ER 500 MG PO TB12: 500.0000 mg | ORAL_TABLET | Freq: Two times a day (BID) | ORAL | 0 refills | Status: DC

## 2022-12-14 MED ORDER — PRAVASTATIN SODIUM 20 MG PO TABS: 40.0000 mg | ORAL_TABLET | Freq: Every day | ORAL | Status: DC

## 2022-12-14 MED ORDER — ONDANSETRON HCL 4 MG/2ML IJ SOLN: 4.0000 mg | Freq: Four times a day (QID) | INTRAMUSCULAR | Status: DC | PRN

## 2022-12-14 MED ORDER — ALBUTEROL SULFATE HFA 108 (90 BASE) MCG/ACT IN AERS: 1.0000 | INHALATION_SPRAY | Freq: Four times a day (QID) | RESPIRATORY_TRACT | Status: DC | PRN

## 2022-12-14 MED ORDER — SODIUM CHLORIDE 0.9 % IV SOLN: INTRAVENOUS | Status: DC

## 2022-12-14 MED ORDER — FENTANYL CITRATE PF 50 MCG/ML IJ SOSY
50.0000 ug | PREFILLED_SYRINGE | Freq: Once | INTRAMUSCULAR | Status: AC
  Administered 2022-12-14: 50 ug via INTRAVENOUS
  Filled 2022-12-14: qty 1

## 2022-12-14 MED ORDER — INSULIN ASPART 100 UNIT/ML IJ SOLN: 0.0000 [IU] | Freq: Three times a day (TID) | INTRAMUSCULAR | Status: DC

## 2022-12-14 MED ORDER — NITROGLYCERIN 0.4 MG SL SUBL: 0.4000 mg | SUBLINGUAL_TABLET | SUBLINGUAL | Status: DC | PRN

## 2022-12-14 MED ORDER — SODIUM CHLORIDE 0.9 % IV BOLUS
1000.0000 mL | Freq: Once | INTRAVENOUS | Status: AC
  Administered 2022-12-14: 1000 mL via INTRAVENOUS

## 2022-12-14 MED ORDER — ONDANSETRON HCL 4 MG/2ML IJ SOLN
4.0000 mg | Freq: Once | INTRAMUSCULAR | Status: AC
  Administered 2022-12-14: 4 mg via INTRAVENOUS
  Filled 2022-12-14: qty 2

## 2022-12-14 MED ORDER — INSULIN ASPART 100 UNIT/ML IJ SOLN: 0.0000 [IU] | Freq: Every day | INTRAMUSCULAR | Status: DC

## 2022-12-14 MED ORDER — ACETAMINOPHEN 325 MG PO TABS: 650.0000 mg | ORAL_TABLET | ORAL | Status: DC | PRN

## 2022-12-14 NOTE — H&P (Signed)
History and Physical    Patient: Kirk Russell ZOX:[id number] DOB: 10-08-1945 DOA: 12/13/2022 DOS: the patient was seen and examined on 12/14/2022 PCP: Gracelyn Nurse, MD  Patient coming from: Home  Chief Complaint:  Chief Complaint  Patient presents with   Chest Pain    HPI: Kirk Russell is a 77 y.o. male with medical history significant for CAD, COPD, diabetes, ex-smoker who presents to the ED by EMS with episode of severe left-sided chest pain associated with diaphoresis.  He had no nausea, vomiting or shortness of breath, palpitations or lightheadedness.  Denies lower extremity pain or swelling.  He took 4 baby aspirin prior to arrival of EMS and chest pain is resolved by arrival.  While in the ED he had another episode of chest pain associated with diaphoresis that resolved with fentanyl ED course and data review:Soft blood pressures on arrival 98/71 improving to 112/73 with a fluid bolus, with otherwise normal vitals.  Troponin 8-12 and D-dimer 0.68.  Sodium 129.  CBC, BMP and hepatic function panel otherwise unremarkable, as well as lipase.  EKG, personally viewed and interpreted showed sinus at 80 with no acute ST-T wave changes. CTA chest negative for PE but showing extensive multivessel coronary artery calcification Hospitalist consulted for admission.   Review of Systems: As mentioned in the history of present illness. All other systems reviewed and are negative.  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   Celiac artery dissection (HCC)    Complication of anesthesia    COPD (chronic obstructive pulmonary disease) (HCC)    Coronary artery disease    Diabetes mellitus without complication (HCC)    Diverticulosis    Glaucoma    Hearing impairment    Hypercholesteremia    Hypertension    Insomnia    Meniere syndrome    Thrombocytopenia (HCC)    Vertigo    Wears hearing aid in left  ear    Past Surgical History:  Procedure Laterality Date   ABDOMINAL SURGERY     APPENDECTOMY     CATARACT EXTRACTION W/PHACO Right 02/04/2020   Procedure: CATARACT EXTRACTION PHACO AND INTRAOCULAR LENS PLACEMENT (IOC) RIGHT DIABETIC  6.01  01:11.3;  Surgeon: Lockie Mola, MD;  Location: [facility];  Service: Ophthalmology;  Laterality: Right;  Diabetic - oral meds   COLONOSCOPY     COLONOSCOPY     COLONOSCOPY N/A 05/30/2021   Procedure: COLONOSCOPY;  Surgeon: Jaynie Collins, DO;  Location: [facility] ENDOSCOPY;  Service: Gastroenterology;  Laterality: N/A;  DIET CONTROLLED   COLONOSCOPY WITH PROPOFOL N/A 01/22/2018   Procedure: COLONOSCOPY WITH PROPOFOL;  Surgeon: Christena Deem, MD;  Location[facility];  Service: Endoscopy;  Laterality: N/A;   CORONARY ANGIOPLASTY WITH STENT PLACEMENT     Social History:  reports that he quit smoking about 34 years ago. His smoking use included cigarettes. He has quit using smokeless tobacco. He reports current alcohol use of about 3.0 standard drinks of alcohol per week. He reports that he does not use drugs.  Allergies  Allergen Reactions   Lovastatin     Other reaction(s): Muscle Pain With higher doses    Family History  Problem Relation Age of Onset   Breast cancer Mother    Prostate cancer Neg Hx    Kidney cancer Neg Hx    Bladder Cancer Neg Hx     Prior  to Admission medications   Medication Sig Start Date End Date Taking? Authorizing Provider  albuterol (VENTOLIN HFA) 108 (90 Base) MCG/ACT inhaler Inhale into the lungs every 6 (six) hours as needed for wheezing or shortness of breath.    [provider]  alfuzosin (UROXATRAL) 10 MG 24 hr tablet Take 1 tablet (10 mg total) by mouth daily with breakfast. 08/23/22   Stoioff, Verna Czech, MD  aspirin EC 81 MG tablet Take 81 mg by mouth daily.    [provider]  Co-Enzyme Q10 200 MG CAPS Take by mouth.    [provider]  dorzolamide-timolol  (COSOPT) 22.3-6.8 MG/ML ophthalmic solution 1 drop 2 (two) times daily.    [provider]  doxycycline (VIBRAMYCIN) 100 MG capsule Take 1 capsule (100 mg total) by mouth 2 (two) times daily. 11/04/22   Katha Cabal, DO  glucosamine-chondroitin 500-400 MG tablet Take 3 tablets by mouth daily.    [provider]  latanoprost (XALATAN) 0.005 % ophthalmic solution 1 drop at bedtime.    [provider]  lisinopril (PRINIVIL,ZESTRIL) 10 MG tablet Take 10 mg by mouth daily.    [provider]  lovastatin (MEVACOR) 20 MG tablet Take by mouth. 08/25/15   [provider]  metFORMIN (GLUCOPHAGE) 500 MG tablet Take 500 mg by mouth 2 (two) times daily with a meal.    [provider]  Misc Natural Products (NEURIVA PO) Take by mouth daily.    [provider]  tadalafil (CIALIS) 10 MG tablet Take 1 tablet (10 mg total) by mouth as needed for erectile dysfunction. 04/19/20   Vaillancourt, Lelon Mast, PA-C  tadalafil (CIALIS) 5 MG tablet Take 1 tablet (5 mg total) by mouth daily. 04/19/20   Vaillancourt, Lelon Mast, PA-C  umeclidinium-vilanterol (ANORO ELLIPTA) 62.5-25 MCG/INH AEPB Inhale 1 puff into the lungs daily.    [provider]    Physical Exam: Vitals:   12/14/22 0100 12/14/22 0200 12/14/22 0230 12/14/22 0315  BP: (!) 105/59 101/65 112/73   Pulse: 65 64 (!) 56 86  Resp: 19 11 15  (!) 21  Temp:      TempSrc:      SpO2: 100% 90% 100% 99%  Weight:      Height:       Physical Exam Vitals and nursing note reviewed.  Constitutional:      General: He is not in acute distress. HENT:     Head: Normocephalic and atraumatic.  Cardiovascular:     Rate and Rhythm: Normal rate and regular rhythm.     Heart sounds: Normal heart sounds.  Pulmonary:     Effort: Pulmonary effort is normal.     Breath sounds: Normal breath sounds.  Abdominal:     Palpations: Abdomen is soft.     Tenderness: There is no abdominal tenderness.   Neurological:     Mental Status: Mental status is at baseline.     Labs on Admission: I have personally reviewed following labs and imaging studies  CBC: Recent Labs  Lab 12/13/22 2240  WBC 6.3  HGB 13.2  HCT 37.3*  MCV 96.4  PLT 124*   Basic Metabolic Panel: Recent Labs  Lab 12/13/22 2240  NA 129*  K 4.0  CL 98  CO2 23  GLUCOSE 138*  BUN 14  CREATININE 0.90  CALCIUM 8.6*   GFR: Estimated Creatinine Clearance: 76.5 mL/min (by C-G formula based on SCr of 0.9 mg/dL). Liver Function Tests: Recent Labs  Lab 12/14/22 0218  AST 15  ALT 18  ALKPHOS 42  BILITOT 0.7  PROT 6.6  ALBUMIN 3.2*   Recent Labs  Lab 12/14/22 0218  LIPASE 33   No results for input(s): "AMMONIA" in the last 168 hours. Coagulation Profile: No results for input(s): "INR", "PROTIME" in the last 168 hours. Cardiac Enzymes: No results for input(s): "CKTOTAL", "CKMB", "CKMBINDEX", "TROPONINI" in the last 168 hours. BNP (last 3 results) No results for input(s): "PROBNP" in the last 8760 hours. HbA1C: No results for input(s): "HGBA1C" in the last 72 hours. CBG: No results for input(s): "GLUCAP" in the last 168 hours. Lipid Profile: No results for input(s): "CHOL", "HDL", "LDLCALC", "TRIG", "CHOLHDL", "LDLDIRECT" in the last 72 hours. Thyroid Function Tests: No results for input(s): "TSH", "T4TOTAL", "FREET4", "T3FREE", "THYROIDAB" in the last 72 hours. Anemia Panel: No results for input(s): "VITAMINB12", "FOLATE", "FERRITIN", "TIBC", "IRON", "RETICCTPCT" in the last 72 hours. Urine analysis:    Component Value Date/Time   APPEARANCEUR Clear 10/08/2018 1448   GLUCOSEU Trace (A) 10/08/2018 1448   BILIRUBINUR Negative 10/08/2018 1448   PROTEINUR Negative 10/08/2018 1448   NITRITE Negative 10/08/2018 1448   LEUKOCYTESUR Negative 10/08/2018 1448    Radiological Exams on Admission: CT Angio Chest PE W and/or Wo Contrast  Result Date: 12/14/2022 CLINICAL DATA:  Low to intermediate  probably pulmonary embolism, positive D-dimer, chest pain EXAM: CT ANGIOGRAPHY CHEST WITH CONTRAST TECHNIQUE: Multidetector CT imaging of the chest was performed using the standard protocol during bolus administration of intravenous contrast. Multiplanar CT image reconstructions and MIPs were obtained to evaluate the vascular anatomy. RADIATION DOSE REDUCTION: This exam was performed according to the departmental dose-optimization program which includes automated exposure control, adjustment of the mA and/or kV according to patient size and/or use of iterative reconstruction technique. CONTRAST:  75mL OMNIPAQUE IOHEXOL 350 MG/ML SOLN COMPARISON:  08/02/2021 FINDINGS: Cardiovascular: Adequate opacification of the pulmonary arterial tree. No intraluminal filling defect identified to suggest acute pulmonary embolism. Central pulmonary arteries are of normal caliber. Extensive multi-vessel coronary artery calcification. Cardiac size within normal limits. No pericardial effusion. Mild atherosclerotic calcification within the thoracic aorta. No aortic aneurysm. Mediastinum/Nodes: No enlarged mediastinal, hilar, or axillary lymph nodes. Thyroid gland, trachea, and esophagus demonstrate no significant findings. Lungs/Pleura: Stable lingular scarring. Lungs are otherwise clear. No pneumothorax or pleural effusion. No central obstructing lesion. Upper Abdomen: No acute abnormality. Musculoskeletal: No acute bone abnormality. No lytic or blastic bone lesion. Review of the MIP images confirms the above findings. IMPRESSION: 1. No pulmonary embolism. No acute intrathoracic pathology identified. 2. Extensive multi-vessel coronary artery calcification. Aortic Atherosclerosis (ICD10-I70.0). Electronically Signed   By: Helyn Numbers M.D.   On: 12/14/2022 03:21   DG Chest 2 View  Result Date: 12/13/2022 CLINICAL DATA:  Chest pain EXAM: CHEST - 2 VIEW COMPARISON:  CT 08/02/2021 FINDINGS: Stable mild left basilar parenchymal  scarring. Lungs are otherwise clear. No pneumothorax or pleural effusion. Cardiac size within normal limits. Pulmonary vascularity is normal. Osseous structures are age-appropriate. No acute bone abnormality. IMPRESSION: 1. No active cardiopulmonary disease. Electronically Signed   By: Helyn Numbers M.D.   On: 12/13/2022 23:43     Data Reviewed: Relevant notes from primary care and specialist visits, past discharge summaries as available in EHR, including Care Everywhere. Prior diagnostic testing as pertinent to current admission diagnoses Updated medications and problem lists for reconciliation ED course, including vitals, labs, imaging, treatment and response to treatment Triage notes, nursing and pharmacy notes and ED provider's notes Notable results as noted in HPI  Assessment and Plan: Chest pain with high risk for cardiac etiology CAD with possible unstable angina Patient presents with episodic typical chest pain.  Normal troponins and EKG nonacute CTA chest negative for PE but showed extensive coronary artery calcification Had a stress test in 2022 that was normal Aspirin, statin, nitroglycerin as needed chest pain, (monitoring for hypertension) Cardiology consulted  Hypotension Uncertain etiology, patient did not receive any nitrates BP was low in the ED, improved with 1 L fluid bolus Continue to monitor Will continue IV fluids  COPD (chronic obstructive pulmonary disease) (HCC) Continue home inhalers with DuoNebs as needed  Diabetes mellitus type 2, uncomplicated (HCC) Sliding scale insulin coverage  Benign essential hypertension Hold antihypertensives due to soft blood pressure on arrival     DVT prophylaxis: Lovenox  Consults: [facility] cardiology  Advance Care Planning: full code  Family Communication: Wife at bedside  Disposition Plan: Back to previous home environment  Severity of Illness: The appropriate patient status for this patient is OBSERVATION.  Observation status is judged to be reasonable and necessary in order to provide the required intensity of service to ensure the patient's safety. The patient's presenting symptoms, physical exam findings, and initial radiographic and laboratory data in the context of their medical condition is felt to place them at decreased risk for further clinical deterioration. Furthermore, it is anticipated that the patient will be medically stable for discharge from the hospital within 2 midnights of admission.   Author: Andris Baumann, MD 12/14/2022 4:09 AM  For on call review [email or web address].

## 2022-12-14 NOTE — Discharge Summary (Signed)
Physician Discharge Summary   Patient: [facility] MRN: [id number]  DOB: 11-01-1945   Admit:     Date of Admission: 12/13/2022 Admitted from: home   Discharge: Date of discharge: 12/14/22 Disposition: Home Condition at discharge: good  CODE STATUS: FULL CODE     Discharge Physician: Sunnie Nielsen, DO [facility]     PCP: Gracelyn Nurse, MD  Recommendations for Outpatient Follow-up:  Follow up with PCP Gracelyn Nurse, MD in 2-4 weeks Follow as directed w/ cardiology for stress test and follow up w/ Dr Darrold Junker    Discharge Instructions     Diet - low sodium heart healthy   Complete by: As directed    Diet Carb Modified   Complete by: As directed    Increase activity slowly   Complete by: As directed          Discharge Diagnoses: Principal Problem:   Unstable angina (HCC) Active Problems:   CAD (coronary artery disease)   Chest pain with high risk for cardiac etiology   Hypotension   Benign essential hypertension   Diabetes mellitus type 2, uncomplicated (HCC)   COPD (chronic obstructive pulmonary disease) (HCC)   Precordial chest pain       Hospital Course: [facility] is a 77 y.o. male with medical history significant for CAD, COPD, diabetes, ex-smoker who presents to the ED by EMS with episode of severe left-sided chest pain associated with diaphoresis. He took 4 baby aspirin prior to arrival of EMS and chest pain was resolved.  08/21: to ED. While in the ED he had another episode of chest pain associated with diaphoresis that resolved with fentanyl. Soft blood pressures on arrival 98/71 improving to 112/73 with a fluid bolus, with otherwise normal vitals. Troponin 8-12 and D-dimer 0.68. Sodium 129. CBC, BMP and hepatic function panel otherwise unremarkable, as well as lipase. EKG, personally viewed and interpreted showed sinus at 80 with no acute ST-T wave changes. No PE on CTA chest but (+) extensive CAD.   08/22: admission to hospitalist service for observation, cardiology consultation.CP resolved. Cardiology cleared for discharge w/ outpatient follow up for "nuclear stress test next week in our office for further evaluation of symptoms. Echo scheduled for tomorrow in our office." Ranexa 500 mg bid, lovastatin + ASA and hold lisinopril for now   Consultants:  Cardiology   Procedures: None       ASSESSMENT & PLAN:   Chest pain with high risk for cardiac etiology CAD with possible unstable angina Follow w/ cardiology outpatient for stress test and echo Ranexa 500 mg bid, lovastatin + ASA and hold lisinopril for now   Hypotension Hold lisinopril   Diabetes mellitus type 2, uncomplicated (HCC) Resume home meds   COPD (chronic obstructive pulmonary disease) (HCC) Continue home inhalers with DuoNebs as needed             Discharge Instructions  Allergies as of 12/14/2022       Reactions   Lovastatin    Other reaction(s): Muscle Pain With higher doses        Medication List     STOP taking these medications    doxycycline 100 MG capsule Commonly known as: VIBRAMYCIN   lisinopril 10 MG tablet Commonly known as: ZESTRIL   tadalafil 10 MG tablet Commonly known as: Cialis   tadalafil 5 MG tablet Commonly known as: CIALIS       TAKE these medications    albuterol 108 (90 Base)  MCG/ACT inhaler Commonly known as: VENTOLIN HFA Inhale into the lungs every 6 (six) hours as needed for wheezing or shortness of breath.   alfuzosin 10 MG 24 hr tablet Commonly known as: UROXATRAL Take 1 tablet (10 mg total) by mouth daily with breakfast.   aspirin EC 81 MG tablet Take 81 mg by mouth daily.   Breztri Aerosphere 160-9-4.8 MCG/ACT Aero Generic drug: Budeson-Glycopyrrol-Formoterol Inhale into the lungs.   citalopram 10 MG tablet Commonly known as: CELEXA Take 1 tablet by mouth daily.   Co-Enzyme Q10 200 MG Caps Take by mouth.   dorzolamide-timolol  2-0.5 % ophthalmic solution Commonly known as: COSOPT 1 drop 2 (two) times daily.   glucosamine-chondroitin 500-400 MG tablet Take 3 tablets by mouth daily.   latanoprost 0.005 % ophthalmic solution Commonly known as: XALATAN 1 drop at bedtime.   lovastatin 20 MG tablet Commonly known as: MEVACOR Take by mouth.   metFORMIN 500 MG tablet Commonly known as: GLUCOPHAGE Take 1 tablet by mouth daily with breakfast. What changed: Another medication with the same name was removed. Continue taking this medication, and follow the directions you see here.   NEURIVA PO Take by mouth daily.   ranolazine 500 MG 12 hr tablet Commonly known as: RANEXA Take 1 tablet (500 mg total) by mouth 2 (two) times daily.   umeclidinium-vilanterol 62.5-25 MCG/INH Aepb Commonly known as: ANORO ELLIPTA Inhale 1 puff into the lungs daily.         Follow-up Information     Paraschos, Lyn Hollingshead, MD Follow up.   Specialty: Cardiology Contact information: [address] [facility] [place] [address] [phone]                 Allergies  Allergen Reactions   Lovastatin     Other reaction(s): Muscle Pain With higher doses     Subjective: pt reports pain has resolved, no other concerns at this time, no SOB, ambulating ok    Discharge Exam: BP (!) 115/59   Pulse 63   Temp (!) 97.5 F (36.4 C) (Oral)   Resp 13   Ht 5\' 9"  (1.753 m)   Wt 90.7 kg   SpO2 98%   BMI 29.53 kg/m  General: Pt is alert, awake, not in acute distress Cardiovascular: RRR, S1/S2 +, no rubs, no gallops Respiratory: CTA bilaterally, no wheezing, no rhonchi Abdominal: Soft, NT, ND, bowel sounds + Extremities: no edema, no cyanosis     The results of significant diagnostics from this hospitalization (including imaging, microbiology, ancillary and laboratory) are listed below for reference.     Microbiology: No results found for this or any previous visit (from the past  240 hour(s)).   Labs: BNP (last 3 results) No results for input(s): "BNP" in the last 8760 hours. Basic Metabolic Panel: Recent Labs  Lab 12/13/22 2240 12/14/22 0448  NA 129*  --   K 4.0  --   CL 98  --   CO2 23  --   GLUCOSE 138*  --   BUN 14  --   CREATININE 0.90 0.79  CALCIUM 8.6*  --    Liver Function Tests: Recent Labs  Lab 12/14/22 0218  AST 15  ALT 18  ALKPHOS 42  BILITOT 0.7  PROT 6.6  ALBUMIN 3.2*   Recent Labs  Lab 12/14/22 0218  LIPASE 33   No results for input(s): "AMMONIA" in the last 168 hours. CBC: Recent Labs  Lab 12/13/22 2240 12/14/22 0448  WBC 6.3  4.1  HGB 13.2 12.3*  HCT 37.3* 35.0*  MCV 96.4 96.7  PLT 124* 102*   Cardiac Enzymes: No results for input(s): "CKTOTAL", "CKMB", "CKMBINDEX", "TROPONINI" in the last 168 hours. BNP: Invalid input(s): "POCBNP" CBG: Recent Labs  Lab 12/14/22 0827  GLUCAP 116*   D-Dimer Recent Labs    12/14/22 0218  DDIMER 0.68*   Hgb A1c Recent Labs    12/14/22 0448  HGBA1C 6.1*   Lipid Profile No results for input(s): "CHOL", "HDL", "LDLCALC", "TRIG", "CHOLHDL", "LDLDIRECT" in the last 72 hours. Thyroid function studies No results for input(s): "TSH", "T4TOTAL", "T3FREE", "THYROIDAB" in the last 72 hours.  Invalid input(s): "FREET3" Anemia work up No results for input(s): "VITAMINB12", "FOLATE", "FERRITIN", "TIBC", "IRON", "RETICCTPCT" in the last 72 hours. Urinalysis    Component Value Date/Time   APPEARANCEUR Clear 10/08/2018 1448   GLUCOSEU Trace (A) 10/08/2018 1448   BILIRUBINUR Negative 10/08/2018 1448   PROTEINUR Negative 10/08/2018 1448   NITRITE Negative 10/08/2018 1448   LEUKOCYTESUR Negative 10/08/2018 1448   Sepsis Labs Recent Labs  Lab 12/13/22 2240 12/14/22 0448  WBC 6.3 4.1   Microbiology No results found for this or any previous visit (from the past 240 hour(s)). Imaging CT Angio Chest PE W and/or Wo Contrast  Result Date: 12/14/2022 CLINICAL DATA:  Low to  intermediate probably pulmonary embolism, positive D-dimer, chest pain EXAM: CT ANGIOGRAPHY CHEST WITH CONTRAST TECHNIQUE: Multidetector CT imaging of the chest was performed using the standard protocol during bolus administration of intravenous contrast. Multiplanar CT image reconstructions and MIPs were obtained to evaluate the vascular anatomy. RADIATION DOSE REDUCTION: This exam was performed according to the departmental dose-optimization program which includes automated exposure control, adjustment of the mA and/or kV according to patient size and/or use of iterative reconstruction technique. CONTRAST:  75mL OMNIPAQUE IOHEXOL 350 MG/ML SOLN COMPARISON:  08/02/2021 FINDINGS: Cardiovascular: Adequate opacification of the pulmonary arterial tree. No intraluminal filling defect identified to suggest acute pulmonary embolism. Central pulmonary arteries are of normal caliber. Extensive multi-vessel coronary artery calcification. Cardiac size within normal limits. No pericardial effusion. Mild atherosclerotic calcification within the thoracic aorta. No aortic aneurysm. Mediastinum/Nodes: No enlarged mediastinal, hilar, or axillary lymph nodes. Thyroid gland, trachea, and esophagus demonstrate no significant findings. Lungs/Pleura: Stable lingular scarring. Lungs are otherwise clear. No pneumothorax or pleural effusion. No central obstructing lesion. Upper Abdomen: No acute abnormality. Musculoskeletal: No acute bone abnormality. No lytic or blastic bone lesion. Review of the MIP images confirms the above findings. IMPRESSION: 1. No pulmonary embolism. No acute intrathoracic pathology identified. 2. Extensive multi-vessel coronary artery calcification. Aortic Atherosclerosis (ICD10-I70.0). Electronically Signed   By: Helyn Numbers M.D.   On: 12/14/2022 03:21   DG Chest 2 View  Result Date: 12/13/2022 CLINICAL DATA:  Chest pain EXAM: CHEST - 2 VIEW COMPARISON:  CT 08/02/2021 FINDINGS: Stable mild left basilar  parenchymal scarring. Lungs are otherwise clear. No pneumothorax or pleural effusion. Cardiac size within normal limits. Pulmonary vascularity is normal. Osseous structures are age-appropriate. No acute bone abnormality. IMPRESSION: 1. No active cardiopulmonary disease. Electronically Signed   By: Helyn Numbers M.D.   On: 12/13/2022 23:43      Time coordinating discharge: ocer 30 minutes  SIGNED:  Sunnie Nielsen DO [facility]

## 2022-12-14 NOTE — Assessment & Plan Note (Addendum)
CAD with possible unstable angina Patient presents with episodic typical chest pain.  Normal troponins and EKG nonacute CTA chest negative for PE but showed extensive coronary artery calcification Had a stress test in 2022 that was normal Aspirin, statin, nitroglycerin as needed chest pain, (monitoring for hypertension) Cardiology consulted

## 2022-12-14 NOTE — Assessment & Plan Note (Signed)
Sliding scale insulin coverage 

## 2022-12-14 NOTE — Assessment & Plan Note (Addendum)
Uncertain etiology, patient did not receive any nitrates BP was low in the ED, improved with 1 L fluid bolus Continue to monitor Will continue IV fluids

## 2022-12-14 NOTE — Consult Note (Signed)
[facility] CONSULT NOTE       Patient ID: Kirk [facility] MRN: [id number] DOB/AGE: [birth year] 77 y.o.  Admit date: 12/13/2022 Referring Physician Dr. Lindajo [facility] Primary Physician Dr. Laural [facility] Primary Cardiologist Dr. Juliann [facility] Reason for Consultation unstable angina  HPI: [facility] is a 77 y.o. male  with a past medical history of CAD with PCI and DES to OM1 2013 and diffuse coronary disease, HTN, hyperlipidemia, COPD, and type 2 DM who presented to the ED on 12/13/2022 for chest discomfort. Cardiology was consulted for further evaluation.  Patient states that over the last 3 weeks he has been having more frequent episodes of left sided "chest pressure" that usually lasts about 10 minutes.  These episodes occur both at rest and with activity. States that he has been active lately, mowing his yard and playing golf, and denies any episodes while doing these activities. The pain usually resolves on its own but he would sit down and rest for about 15 minutes when he had an episode. Reports some associated SOB, denies palpitations, dizziness, syncope. He recently saw Kirk Ivanoff, PA about his symptoms and is scheduled for an echo in our clinic tomorrow.   Yesterday he had an episode of pain that was more severe than the others he has experienced and decided to come to the ED for evaluation. He took 4 doses of 81 mg aspirin and his chest pain resolved. When he was in the ED he had another episode which resolved with IV fentanyl, he was not given nitroglycerin due to borderline low BP. Workup in the ED notable for troponins 8 > 12. EKG non-ischemic, stable from prior. At the time of my evaluation his chest pain has resolved. States he is feeling back to his baseline but expresses that he wants to know what is causing his symptoms. This morning he denies any chest pain, shortness of breath, palpitations, dizziness. States that he has occasional LE edema but this is not  significant. He is pleased that he is feeling better and is hoping to go home today.  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   Celiac artery dissection (HCC)    Complication of anesthesia    COPD (chronic obstructive pulmonary disease) (HCC)    Coronary artery disease    Diabetes mellitus without complication (HCC)    Diverticulosis    Glaucoma    Hearing impairment    Hypercholesteremia    Hypertension    Insomnia    Meniere syndrome    Thrombocytopenia (HCC)    Vertigo    Wears hearing aid in left ear     Past Surgical History:  Procedure Laterality Date   ABDOMINAL SURGERY     APPENDECTOMY     CATARACT EXTRACTION W/PHACO Right 02/04/2020   Procedure: CATARACT EXTRACTION PHACO AND INTRAOCULAR LENS PLACEMENT (IOC) RIGHT DIABETIC  6.01  01:11.3;  Surgeon: Lockie Mola, MD;  Location: [facility];  Service: Ophthalmology;  Laterality: Right;  Diabetic - oral meds   COLONOSCOPY     COLONOSCOPY     COLONOSCOPY N/A 05/30/2021   Procedure: COLONOSCOPY;  Surgeon: Jaynie Collins, DO;  Location: [facility];  Service: Gastroenterology;  Laterality: N/A;  DIET CONTROLLED   COLONOSCOPY WITH PROPOFOL N/A 01/22/2018   Procedure: COLONOSCOPY WITH PROPOFOL;  Surgeon: Christena Deem, MD;  Location: [facility];  Service: Endoscopy;  Laterality: N/A;   CORONARY ANGIOPLASTY WITH STENT PLACEMENT      (Not in a [facility] admission)  Social History   Socioeconomic History   Marital status: Married    Spouse name: Not on file   Number of children: Not on file   Years of education: Not on file   Highest education level: Not on file  Occupational History   Not on file  Tobacco Use   Smoking status: Former    Current packs/day: 0.00    Types: Cigarettes    Quit date: 1990    Years since quitting: 34.6    Smokeless tobacco: Former  Building services engineer status: Never Used  Substance and Sexual Activity   Alcohol use: Yes    Alcohol/week: 3.0 standard drinks of alcohol    Types: 3 Cans of beer per week   Drug use: No   Sexual activity: Yes    Birth control/protection: None  Other Topics Concern   Not on file  Social History Narrative   Not on file   Social Determinants of Health   Financial Resource Strain: Not on file  Food Insecurity: Not on file  Transportation Needs: Not on file  Physical Activity: Not on file  Stress: Not on file  Social Connections: Not on file  Intimate Partner Violence: Not on file    Family History  Problem Relation Age of Onset   Breast cancer Mother    Prostate cancer Neg Hx    Kidney cancer Neg Hx    Bladder Cancer Neg Hx      Vitals:   12/14/22 0845 12/14/22 0900 12/14/22 0915 12/14/22 1130  BP:  (!) 115/59  128/76  Pulse: 61 61 63 93  Resp: 15 11 13 14   Temp:      TempSrc:      SpO2: 99% 99% 98% 100%  Weight:      Height:        PHYSICAL EXAM General: Well-appearing elderly male, well nourished, in no acute distress resting comfortably in ED stretcher. HEENT: Normocephalic and atraumatic. Neck: No JVD.  Lungs: Normal respiratory effort on room air. Clear bilaterally to auscultation. No wheezes, crackles, rhonchi.  Heart: HRRR. Normal S1 and S2 without gallops or murmurs.  Abdomen: Non-distended appearing.  Msk: Normal strength and tone for age. Extremities: Warm and well perfused. No clubbing, cyanosis.  No edema.  Neuro: Alert and oriented X 3. Psych: Answers questions appropriately.   Labs: Basic Metabolic Panel: Recent Labs    12/13/22 2240 12/14/22 0448  NA 129*  --   K 4.0  --   CL 98  --   CO2 23  --   GLUCOSE 138*  --   BUN 14  --   CREATININE 0.90 0.79  CALCIUM 8.6*  --    Liver Function Tests: Recent Labs    12/14/22 0218  AST 15  ALT 18  ALKPHOS 42  BILITOT 0.7  PROT 6.6  ALBUMIN 3.2*   Recent Labs     12/14/22 0218  LIPASE 33   CBC: Recent Labs    12/13/22 2240 12/14/22 0448  WBC 6.3 4.1  HGB 13.2 12.3*  HCT 37.3* 35.0*  MCV 96.4 96.7  PLT 124* 102*   Cardiac Enzymes: Recent Labs    12/13/22 2240 12/14/22 0107  TROPONINIHS 8 12   BNP: No results for input(s): "BNP" in the last 72 hours.  D-Dimer: Recent Labs    12/14/22 0218  DDIMER 0.68*   Hemoglobin A1C: Recent Labs    12/14/22 0448  HGBA1C 6.1*   Fasting Lipid Panel: No results for input(s): "CHOL", "HDL", "LDLCALC", "TRIG", "CHOLHDL", "LDLDIRECT" in the last 72 hours. Thyroid Function Tests: No results for input(s): "TSH", "T4TOTAL", "T3FREE", "THYROIDAB" in the last 72 hours.  Invalid input(s): "FREET3" Anemia Panel: No results for input(s): "VITAMINB12", "FOLATE", "FERRITIN", "TIBC", "IRON", "RETICCTPCT" in the last 72 hours.   Radiology: CT Angio Chest PE W and/or Wo Contrast  Result Date: 12/14/2022 CLINICAL DATA:  Low to intermediate probably pulmonary embolism, positive D-dimer, chest pain EXAM: CT ANGIOGRAPHY CHEST WITH CONTRAST TECHNIQUE: Multidetector CT imaging of the chest was performed using the standard protocol during bolus administration of intravenous contrast. Multiplanar CT image reconstructions and MIPs were obtained to evaluate the vascular anatomy. RADIATION DOSE REDUCTION: This exam was performed according to the departmental dose-optimization program which includes automated exposure control, adjustment of the mA and/or kV according to patient size and/or use of iterative reconstruction technique. CONTRAST:  75mL OMNIPAQUE IOHEXOL 350 MG/ML SOLN COMPARISON:  08/02/2021 FINDINGS: Cardiovascular: Adequate opacification of the pulmonary arterial tree. No intraluminal filling defect identified to suggest acute pulmonary embolism. Central pulmonary arteries are of normal caliber. Extensive multi-vessel coronary artery calcification. Cardiac size within normal limits. No pericardial effusion.  Mild atherosclerotic calcification within the thoracic aorta. No aortic aneurysm. Mediastinum/Nodes: No enlarged mediastinal, hilar, or axillary lymph nodes. Thyroid gland, trachea, and esophagus demonstrate no significant findings. Lungs/Pleura: Stable lingular scarring. Lungs are otherwise clear. No pneumothorax or pleural effusion. No central obstructing lesion. Upper Abdomen: No acute abnormality. Musculoskeletal: No acute bone abnormality. No lytic or blastic bone lesion. Review of the MIP images confirms the above findings. IMPRESSION: 1. No pulmonary embolism. No acute intrathoracic pathology identified. 2. Extensive multi-vessel coronary artery calcification. Aortic Atherosclerosis (ICD10-I70.0). Electronically Signed   By: Helyn Numbers M.D.   On: 12/14/2022 03:21   DG Chest 2 View  Result Date: 12/13/2022 CLINICAL DATA:  Chest pain EXAM: CHEST - 2 VIEW COMPARISON:  CT 08/02/2021 FINDINGS: Stable mild left basilar parenchymal scarring. Lungs are otherwise clear. No pneumothorax or pleural effusion. Cardiac size within normal limits. Pulmonary vascularity is normal. Osseous structures are age-appropriate. No acute bone abnormality. IMPRESSION: 1. No active cardiopulmonary disease. Electronically Signed   By: Helyn Numbers M.D.   On: 12/13/2022 23:43    ECHO 07/2021:  INTERPRETATION  NORMAL LEFT VENTRICULAR SYSTOLIC FUNCTION  NORMAL RIGHT VENTRICULAR SYSTOLIC FUNCTION  NO VALVULAR STENOSIS  MILD AR, TR, PR  TRIVIAL MR  EF 55%   TELEMETRY reviewed by me [facility]) 12/14/2022: sinus rhythm rate 50-60s with PVCs  EKG reviewed by me: SR with PVC rate 69 bpm  Data reviewed by me [facility]) 12/14/2022: last 24h vitals tele labs imaging I/O ED provider note, admission H&P  Principal Problem:   Unstable angina (HCC) Active Problems:   Benign essential hypertension   CAD (coronary artery disease)   Diabetes mellitus type 2, uncomplicated (HCC)   Hypotension   COPD (chronic obstructive pulmonary  disease) (HCC)   Chest pain with high risk for cardiac etiology   Precordial chest pain    ASSESSMENT AND PLAN:  Exodus Whitchurch is a 77 y.o. male  with a past medical history of CAD with PCI and DES to OM1 2013 and diffuse coronary disease, HTN, hyperlipidemia, COPD, and type 2 DM who presented to the ED on 12/13/2022 for chest discomfort. Cardiology was  consulted for further evaluation.   # Chest pain # Coronary artery disease s/p PCI and DES to prox LAD in 2013 # Hyperlipidemia Patient with three weeks of intermittent episodes of chest pressure, currently undergoing outpatient workup who presented with an episode of more significant chest discomfort which resolved after aspirin. Had another episode in the ED which resolved with IV fentanyl. Troponins 8 > 12. EKG nonischemic.  -Will plan for nuclear stress test next week in our office for further evaluation of symptoms.  -Echo scheduled for tomorrow in our office.  -Start ranexa 500 mg twice daily for management of anginal symptoms.  -Continue lovastatin 20 mg daily and aspirin 81 mg daily.   # Hypotension Patient with essential hypertension on lisinopril at home with borderline low BP in the ED, without dizziness or syncope.  -Hold lisinopril with plan to restart as BP improves.   Patient stable for discharge home today with further workup with nuclear stress test and echocardiogram scheduled. Patient scheduled for follow up in our clinic on 12/28/2022.  This patient's plan of care was discussed and created with Dr. Melton Alar and she is in agreement.  Signed: Cheryl Flash, PA-C 12/14/2022, 1:07 PM [facility] Card[facility]

## 2022-12-14 NOTE — Assessment & Plan Note (Signed)
Hold antihypertensives due to soft blood pressure on arrival

## 2022-12-14 NOTE — Assessment & Plan Note (Signed)
Continue home inhalers with DuoNebs as needed

## 2022-12-14 NOTE — Hospital Course (Signed)
Kirk Russell is a 77 y.o. male with medical history significant for CAD, COPD, diabetes, ex-smoker who presents to the ED by EMS with episode of severe left-sided chest pain associated with diaphoresis. He took 4 baby aspirin prior to arrival of EMS and chest pain was resolved.  08/21: to ED. While in the ED he had another episode of chest pain associated with diaphoresis that resolved with fentanyl. Soft blood pressures on arrival 98/71 improving to 112/73 with a fluid bolus, with otherwise normal vitals. Troponin 8-12 and D-dimer 0.68. Sodium 129. CBC, BMP and hepatic function panel otherwise unremarkable, as well as lipase. EKG, personally viewed and interpreted showed sinus at 80 with no acute ST-T wave changes. No PE on CTA chest but (+) extensive CAD.  08/22: admission to hospitalist service for observation, cardiology consultation.   Consultants:  ***  Procedures: ***      ASSESSMENT & PLAN:   Principal Problem:   Unstable angina (HCC) Active Problems:   CAD (coronary artery disease)   Chest pain with high risk for cardiac etiology   Hypotension   Benign essential hypertension   Diabetes mellitus type 2, uncomplicated (HCC)   COPD (chronic obstructive pulmonary disease) (HCC)   Precordial chest pain   Hypotension Uncertain etiology, patient did not receive any nitrates BP was low in the ED, improved with 1 L fluid bolus Continue to monitor Will continue IV fluids  Benign essential hypertension Hold antihypertensives due to soft blood pressure on arrival  Diabetes mellitus type 2, uncomplicated (HCC) Sliding scale insulin coverage  COPD (chronic obstructive pulmonary disease) (HCC) Continue home inhalers with DuoNebs as needed  Chest pain with high risk for cardiac etiology CAD with possible unstable angina Patient presents with episodic typical chest pain.  Normal troponins and EKG nonacute CTA chest negative for PE but showed extensive coronary artery  calcification Had a stress test in 2022 that was normal Aspirin, statin, nitroglycerin as needed chest pain, (monitoring for hypertension) Cardiology consulted    DVT prophylaxis: *** Pertinent IV fluids/nutrition: *** Central lines / invasive devices: ***  Code Status: *** ACP documentation reviewed: ***  Current Admission Status: ***  TOC needs / Dispo plan: *** Barriers to discharge / significant pending items: ***

## 2022-12-14 NOTE — Assessment & Plan Note (Deleted)
CAD Patient presents with typical chest pain.  Normal troponins and EKG nonacute CTA chest negative for PE but showed extensive coronary artery calcification Had a stress test in 2022 that was normal Aspirin, statin, nitroglycerin as needed chest pain, (monitoring for hypertension) Cardiology consulted

## 2022-12-15 LAB — LIPOPROTEIN A (LPA): Lipoprotein (a): 10.5 nmol/L (ref <75.0)

## 2022-12-23 ENCOUNTER — Encounter: Payer: Self-pay | Admitting: Emergency Medicine

## 2022-12-23 ENCOUNTER — Other Ambulatory Visit: Payer: Self-pay

## 2022-12-23 ENCOUNTER — Emergency Department: Payer: Medicare Other

## 2022-12-23 ENCOUNTER — Inpatient Hospital Stay
Admission: EM | Admit: 2022-12-23 | Discharge: 2022-12-24 | DRG: 303 | Disposition: A | Discharge status: Home / Self Care | Payer: Medicare Other | Attending: Internal Medicine | Admitting: Internal Medicine

## 2022-12-23 DIAGNOSIS — E785 Hyperlipidemia, unspecified: Secondary | ICD-10-CM | POA: Diagnosis not present

## 2022-12-23 DIAGNOSIS — I25110 Atherosclerotic heart disease of native coronary artery with unstable angina pectoris: Principal | ICD-10-CM | POA: Diagnosis present

## 2022-12-23 DIAGNOSIS — E669 Obesity, unspecified: Secondary | ICD-10-CM | POA: Diagnosis present

## 2022-12-23 DIAGNOSIS — Z87891 Personal history of nicotine dependence: Secondary | ICD-10-CM | POA: Diagnosis not present

## 2022-12-23 DIAGNOSIS — I2510 Atherosclerotic heart disease of native coronary artery without angina pectoris: Secondary | ICD-10-CM

## 2022-12-23 DIAGNOSIS — Z79899 Other long term (current) drug therapy: Secondary | ICD-10-CM | POA: Diagnosis not present

## 2022-12-23 DIAGNOSIS — E119 Type 2 diabetes mellitus without complications: Secondary | ICD-10-CM | POA: Diagnosis present

## 2022-12-23 DIAGNOSIS — Z955 Presence of coronary angioplasty implant and graft: Secondary | ICD-10-CM | POA: Diagnosis not present

## 2022-12-23 DIAGNOSIS — Z6829 Body mass index (BMI) 29.0-29.9, adult: Secondary | ICD-10-CM

## 2022-12-23 DIAGNOSIS — Z85828 Personal history of other malignant neoplasm of skin: Secondary | ICD-10-CM | POA: Diagnosis not present

## 2022-12-23 DIAGNOSIS — Z86010 Personal history of colonic polyps: Secondary | ICD-10-CM | POA: Diagnosis not present

## 2022-12-23 DIAGNOSIS — J449 Chronic obstructive pulmonary disease, unspecified: Secondary | ICD-10-CM | POA: Diagnosis present

## 2022-12-23 DIAGNOSIS — I200 Unstable angina: Secondary | ICD-10-CM | POA: Diagnosis not present

## 2022-12-23 DIAGNOSIS — I10 Essential (primary) hypertension: Secondary | ICD-10-CM | POA: Diagnosis not present

## 2022-12-23 DIAGNOSIS — R079 Chest pain, unspecified: Secondary | ICD-10-CM | POA: Diagnosis not present

## 2022-12-23 DIAGNOSIS — Z803 Family history of malignant neoplasm of breast: Secondary | ICD-10-CM

## 2022-12-23 DIAGNOSIS — D696 Thrombocytopenia, unspecified: Secondary | ICD-10-CM | POA: Diagnosis present

## 2022-12-23 DIAGNOSIS — Z7984 Long term (current) use of oral hypoglycemic drugs: Secondary | ICD-10-CM

## 2022-12-23 DIAGNOSIS — H409 Unspecified glaucoma: Secondary | ICD-10-CM | POA: Diagnosis present

## 2022-12-23 DIAGNOSIS — E7800 Pure hypercholesterolemia, unspecified: Secondary | ICD-10-CM | POA: Diagnosis present

## 2022-12-23 DIAGNOSIS — E1169 Type 2 diabetes mellitus with other specified complication: Secondary | ICD-10-CM | POA: Diagnosis not present

## 2022-12-23 DIAGNOSIS — Z7982 Long term (current) use of aspirin: Secondary | ICD-10-CM | POA: Diagnosis not present

## 2022-12-23 DIAGNOSIS — Z888 Allergy status to other drugs, medicaments and biological substances status: Secondary | ICD-10-CM | POA: Diagnosis not present

## 2022-12-23 DIAGNOSIS — Z7902 Long term (current) use of antithrombotics/antiplatelets: Secondary | ICD-10-CM | POA: Diagnosis not present

## 2022-12-23 DIAGNOSIS — Z7951 Long term (current) use of inhaled steroids: Secondary | ICD-10-CM

## 2022-12-23 LAB — APTT: aPTT: 30 seconds (ref 24–36)

## 2022-12-23 LAB — TROPONIN I (HIGH SENSITIVITY)
Troponin I (High Sensitivity): 12 ng/L (ref <18)
Troponin I (High Sensitivity): 16 ng/L (ref <18)

## 2022-12-23 LAB — BASIC METABOLIC PANEL
Anion gap: 8 (ref 5–15)
BUN: 17 mg/dL (ref 8–23)
CO2: 26 mmol/L (ref 22–32)
Calcium: 9.1 mg/dL (ref 8.9–10.3)
Chloride: 100 mmol/L (ref 98–111)
Creatinine, Ser: 1.04 mg/dL (ref 0.61–1.24)
GFR, Estimated: >60 mL/min (ref >60)
Glucose, Bld: 132 mg/dL — ABNORMAL HIGH (ref 70–99)
Potassium: 4.5 mmol/L (ref 3.5–5.1)
Sodium: 134 mmol/L — ABNORMAL LOW (ref 135–145)

## 2022-12-23 LAB — CBC
HCT: 38.6 % — ABNORMAL LOW (ref 39.0–52.0)
Hemoglobin: 13.6 g/dL (ref 13.0–17.0)
MCH: 33.9 pg (ref 26.0–34.0)
MCHC: 35.2 g/dL (ref 30.0–36.0)
MCV: 96.3 fL (ref 80.0–100.0)
Platelets: 136 10*3/uL — ABNORMAL LOW (ref 150–400)
RBC: 4.01 MIL/uL — ABNORMAL LOW (ref 4.22–5.81)
RDW: 11.9 % (ref 11.5–15.5)
WBC: 5.1 10*3/uL (ref 4.0–10.5)
nRBC: 0 % (ref 0.0–0.2)

## 2022-12-23 LAB — HEPARIN LEVEL (UNFRACTIONATED): Heparin Unfractionated: 0.38 [IU]/mL (ref 0.30–0.70)

## 2022-12-23 LAB — PROTIME-INR
INR: 1.1 (ref 0.8–1.2)
Prothrombin Time: 14 seconds (ref 11.4–15.2)

## 2022-12-23 MED ORDER — CLOPIDOGREL BISULFATE 75 MG PO TABS
300.0000 mg | ORAL_TABLET | Freq: Once | ORAL | Status: AC
  Administered 2022-12-23: 300 mg via ORAL
  Filled 2022-12-23: qty 4

## 2022-12-23 MED ORDER — NITROGLYCERIN 0.4 MG SL SUBL: 0.4000 mg | SUBLINGUAL_TABLET | SUBLINGUAL | Status: DC | PRN

## 2022-12-23 MED ORDER — UMECLIDINIUM-VILANTEROL 62.5-25 MCG/INH IN AEPB
1.0000 | INHALATION_SPRAY | Freq: Every day | RESPIRATORY_TRACT | Status: DC
  Filled 2022-12-23: qty 1

## 2022-12-23 MED ORDER — ISOSORBIDE MONONITRATE ER 60 MG PO TB24
30.0000 mg | ORAL_TABLET | Freq: Every day | ORAL | Status: DC
  Administered 2022-12-23 – 2022-12-24 (×2): 30 mg via ORAL
  Filled 2022-12-23 (×2): qty 1

## 2022-12-23 MED ORDER — HEPARIN BOLUS VIA INFUSION
4000.0000 [IU] | Freq: Once | INTRAVENOUS | Status: AC
  Administered 2022-12-23: 4000 [IU] via INTRAVENOUS
  Filled 2022-12-23: qty 4000

## 2022-12-23 MED ORDER — INSULIN ASPART 100 UNIT/ML IJ SOLN
0.0000 [IU] | Freq: Three times a day (TID) | INTRAMUSCULAR | Status: DC
  Administered 2022-12-23: 0 [IU] via SUBCUTANEOUS

## 2022-12-23 MED ORDER — ACETAMINOPHEN 325 MG PO TABS: 650.0000 mg | ORAL_TABLET | ORAL | Status: DC | PRN

## 2022-12-23 MED ORDER — HEPARIN (PORCINE) 25000 UT/250ML-% IV SOLN
1200.0000 [IU]/h | INTRAVENOUS | Status: DC
  Administered 2022-12-23 – 2022-12-24 (×2): 1200 [IU]/h via INTRAVENOUS
  Filled 2022-12-23 (×2): qty 250

## 2022-12-23 MED ORDER — ASPIRIN 81 MG PO TBEC
81.0000 mg | DELAYED_RELEASE_TABLET | Freq: Every day | ORAL | Status: DC
  Administered 2022-12-23 – 2022-12-24 (×2): 81 mg via ORAL
  Filled 2022-12-23 (×2): qty 1

## 2022-12-23 MED ORDER — LISINOPRIL 5 MG PO TABS
2.5000 mg | ORAL_TABLET | Freq: Every day | ORAL | Status: DC
  Administered 2022-12-23 – 2022-12-24 (×2): 2.5 mg via ORAL
  Filled 2022-12-23 (×2): qty 1

## 2022-12-23 MED ORDER — ALBUTEROL SULFATE (2.5 MG/3ML) 0.083% IN NEBU: 2.5000 mg | INHALATION_SOLUTION | Freq: Four times a day (QID) | RESPIRATORY_TRACT | Status: DC | PRN

## 2022-12-23 MED ORDER — ONDANSETRON HCL 4 MG/2ML IJ SOLN: 4.0000 mg | Freq: Four times a day (QID) | INTRAMUSCULAR | Status: DC | PRN

## 2022-12-23 MED ORDER — MELATONIN 5 MG PO TABS
2.5000 mg | ORAL_TABLET | Freq: Every day | ORAL | Status: DC
  Administered 2022-12-23: 2.5 mg via ORAL
  Filled 2022-12-23: qty 1

## 2022-12-23 MED ORDER — RANOLAZINE ER 500 MG PO TB12
500.0000 mg | ORAL_TABLET | Freq: Two times a day (BID) | ORAL | Status: DC
  Administered 2022-12-23 – 2022-12-24 (×3): 500 mg via ORAL
  Filled 2022-12-23 (×4): qty 1

## 2022-12-23 MED ORDER — CITALOPRAM HYDROBROMIDE 20 MG PO TABS
10.0000 mg | ORAL_TABLET | Freq: Every day | ORAL | Status: DC
  Administered 2022-12-23 – 2022-12-24 (×2): 10 mg via ORAL
  Filled 2022-12-23 (×2): qty 1

## 2022-12-23 NOTE — Consult Note (Signed)
Kirk Russell is a 77 y.o. male  [id number]  Primary Cardiologist: [facility] cardiology Reason for Consultation: Unstable angina  HPI: This is a 77 year old white male with a past medical history of PCI and stenting of OM1 in 2013, hypertension hyperlipidemia and COPD beside type 2 diabetes presented with recurrent chest pain associated with shortness of breath which wakes him up in the morning.  He was started on Ranexa aspirin and statin and states has not relieved his chest pain.  In the meantime he had a stress test at [facility] which was abnormal and was scheduled for cardiac catheterization in the middle of September.  Patient had recurrent chest pain this morning and came to the hospital.   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   Celiac artery dissection (HCC)    Complication of anesthesia    COPD (chronic obstructive pulmonary disease) (HCC)    Coronary artery disease    Diabetes mellitus without complication (HCC)    Diverticulosis    Glaucoma    Hearing impairment    Hypercholesteremia    Hypertension    Insomnia    Meniere syndrome    Thrombocytopenia (HCC)    Vertigo    Wears hearing aid in left ear     (Not in a hospital admission)     aspirin EC  81 mg Oral Daily   citalopram  10 mg Oral Daily   insulin aspart  0-9 Units Subcutaneous TID WC   ranolazine  500 mg Oral BID   umeclidinium-vilanterol  1 puff Inhalation Daily    Infusions:  heparin 1,200 Units/hr (12/23/22 0851)    Allergies  Allergen Reactions   Lovastatin Other (See Comments)    Other reaction(s): Muscle Pain With higher doses    Social History   Socioeconomic History   Marital status: Married    Spouse name: Not on file   Number of children: Not on file   Years of education: Not on file   Highest education level: Not on file  Occupational  History   Not on file  Tobacco Use   Smoking status: Former    Current packs/day: 0.00    Types: Cigarettes    Quit date: 1990    Years since quitting: 34.6   Smokeless tobacco: Former  Building services engineer status: Never Used  Substance and Sexual Activity   Alcohol use: Yes    Alcohol/week: 3.0 standard drinks of alcohol    Types: 3 Cans of beer per week   Drug use: No   Sexual activity: Yes    Birth control/protection: None  Other Topics Concern   Not on file  Social History Narrative   Not on file   Social Determinants of Health   Financial Resource Strain: Not on file  Food Insecurity: Not on file  Transportation Needs: Not on file  Physical Activity: Not on file  Stress: Not on file  Social Connections: Not on file  Intimate Partner Violence: Not on file    Family History  Problem Relation Age of Onset   Breast cancer Mother    Prostate cancer Neg Hx    Kidney cancer Neg Hx    Bladder Cancer Neg Hx     PHYSICAL EXAM: Vitals:   12/23/22 1215 12/23/22 1500  BP:  130/68  Pulse: (!) 59 69  Resp: 15 16  Temp:  97.7 F (36.5 C)  SpO2: 97% 96%    No intake or output data in the 24 hours ending 12/23/22 1653  General:  Well appearing. No respiratory difficulty HEENT: normal Neck: supple. no JVD. Carotids 2+ bilat; no bruits. No lymphadenopathy or thryomegaly appreciated. Cor: PMI nondisplaced. Regular rate & rhythm. No rubs, gallops or murmurs. Lungs: clear Abdomen: soft, nontender, nondistended. No hepatosplenomegaly. No bruits or masses. Good bowel sounds. Extremities: no cyanosis, clubbing, rash, edema Neuro: alert & oriented x 3, cranial nerves grossly intact. moves all 4 extremities w/o difficulty. Affect pleasant.  ECG: Sinus bradycardia 53 bpm with no acute changes.  Results for orders placed or performed during the hospital encounter of 12/23/22 (from the past 24 hour(s))  Basic metabolic panel     Status: Abnormal   Collection Time: 12/23/22   6:17 AM  Result Value Ref Range   Sodium 134 (L) 135 - 145 mmol/L   Potassium 4.5 3.5 - 5.1 mmol/L   Chloride 100 98 - 111 mmol/L   CO2 26 22 - 32 mmol/L   Glucose, Bld 132 (H) 70 - 99 mg/dL   BUN 17 8 - 23 mg/dL   Creatinine, Ser 4.54 0.61 - 1.24 mg/dL   Calcium 9.1 8.9 - 09.8 mg/dL   GFR, Estimated >11 >91 mL/min   Anion gap 8 5 - 15  CBC     Status: Abnormal   Collection Time: 12/23/22  6:17 AM  Result Value Ref Range   WBC 5.1 4.0 - 10.5 K/uL   RBC 4.01 (L) 4.22 - 5.81 MIL/uL   Hemoglobin 13.6 13.0 - 17.0 g/dL   HCT 47.8 (L) 29.5 - 62.1 %   MCV 96.3 80.0 - 100.0 fL   MCH 33.9 26.0 - 34.0 pg   MCHC 35.2 30.0 - 36.0 g/dL   RDW 30.8 65.7 - 84.6 %   Platelets 136 (L) 150 - 400 K/uL   nRBC 0.0 0.0 - 0.2 %  Troponin I (High Sensitivity)     Status: None   Collection Time: 12/23/22  6:17 AM  Result Value Ref Range   Troponin I (High Sensitivity) 12 <18 ng/L  Troponin I (High Sensitivity)     Status: None   Collection Time: 12/23/22  8:48 AM  Result Value Ref Range   Troponin I (High Sensitivity) 16 <18 ng/L  APTT     Status: None   Collection Time: 12/23/22  8:48 AM  Result Value Ref Range   aPTT 30 24 - 36 seconds  Protime-INR     Status: None   Collection Time: 12/23/22  8:48 AM  Result Value Ref Range   Prothrombin Time 14.0 11.4 - 15.2 seconds   INR 1.1 0.8 - 1.2   DG Chest 2 View  Result Date: 12/23/2022 CLINICAL DATA:  Chest pain EXAM: CHEST - 2 VIEW COMPARISON:  12/13/2022 FINDINGS: Normal heart size and mediastinal contours. No acute infiltrate or edema. Mild hazy density at the left base from atelectasis or scarring by prior CT. No effusion or pneumothorax. No acute osseous findings. Diffuse degenerative endplate spurring. IMPRESSION: No active cardiopulmonary disease. Electronically Signed   By: Tiburcio Pea M.D.   On: 12/23/2022 06:33     ASSESSMENT AND PLAN: Unstable angina with history of PCI and stenting of [id number], presented with chest pain associated  with shortness of breath and diaphoresis.  Patient was seen on 12/13/2022 by cardiology in  the emergency room and was sent home for echo and a stress test which was positive and was scheduled for cardiac catheterization on 17 September by Dr. Daisy Blossom.  Patient states that he continues to have chest pain in spite of being on Ranexa 500 twice daily aspirin and statins.  He says he cannot live like this.  He is willing to stay here until Tuesday to have cardiac catheterization.  Discussed with him the options and will maximize his medical therapy beside heparin and see how he does in the morning.  May need to stay here until Tuesday if he continues to have chest pain.  Ambrie Carte Welton Flakes

## 2022-12-23 NOTE — ED Notes (Signed)
Pt care taken, is resting watching football, no complaints, vs wnl

## 2022-12-23 NOTE — ED Triage Notes (Signed)
Pt in via POV w/ complaints of left sided chest w/ diaphoresis.  Reports hx of same w/ scheduled stent placement for 01/03/23.  Reports taking 1 nitro today, pain will subside and then return.    Ambulatory to triage; NAD noted at this time.

## 2022-12-23 NOTE — Assessment & Plan Note (Signed)
BP stable Titrate home regimen 

## 2022-12-23 NOTE — Consult Note (Signed)
ANTICOAGULATION CONSULT NOTE  Pharmacy Consult for Heparin Infusion Indication: chest pain/ACS  Allergies  Allergen Reactions   Lovastatin Other (See Comments)    Other reaction(s): Muscle Pain With higher doses    Patient Measurements: Height: 5\' 9"  (175.3 cm) Weight: 90.7 kg (200 lb) IBW/kg (Calculated) : 70.7 Heparin Dosing Weight: 89.1 kg  Vital Signs: Temp: 97.7 F (36.5 C) (08/31 1500) Temp Source: Oral (08/31 1500) BP: 130/68 (08/31 1500) Pulse Rate: 69 (08/31 1500)  Labs: Recent Labs    12/23/22 0617 12/23/22 0848 12/23/22 1825  HGB 13.6  --   --   HCT 38.6*  --   --   PLT 136*  --   --   APTT  --  30  --   LABPROT  --  14.0  --   INR  --  1.1  --   HEPARINUNFRC  --   --  0.38  CREATININE 1.04  --   --   TROPONINIHS 12 16  --     Estimated Creatinine Clearance: 66.2 mL/min (by C-G formula based on SCr of 1.04 mg/dL).   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 male presenting with chest pain with associated diaphoresis. PMH significant for HTN, T2DM, COPD, CAD, Meniere syndrome, HLD, thrombocytopenia, BPH. Patient had recent outpatient stress test showing abnormality concern for ischemia with plan for outpatient catheterization. Patient was not on [facility] PTA per chart review. Pharmacy has been consulted to initiate and manage heparin infusion.   Baseline Labs: aPTT 30, PT 14.0, INR 1.1, Hgb 13.6, Hct 38.6, Plt 136   Goal of  Therapy:  Heparin level 0.3-0.7 units/ml Monitor platelets by anticoagulation protocol: Yes   Date Time HL Rate/Comment  8/31 1854 0.38 1200/therapeutic x1  Plan:  Continue heparin infusion at 1200 units/hr Check HL in 8 hours  Continue to monitor H&H and platelets daily while on heparin infusion   Celene Squibb, PharmD Clinical Pharmacist 12/23/2022 7:07 PM

## 2022-12-23 NOTE — Consult Note (Signed)
ANTICOAGULATION CONSULT NOTE  Pharmacy Consult for Heparin Indication: chest pain/ACS  Allergies  Allergen Reactions   Lovastatin Other (See Comments)    Other reaction(s): Muscle Pain With higher doses    Patient Measurements: Height: 5\' 9"  (175.3 cm) Weight: 90.7 kg (200 lb) IBW/kg (Calculated) : 70.7 Heparin Dosing Weight: 89.1 kg  Vital Signs: Temp: 97.6 F (36.4 C) (08/31 0616) Temp Source: Oral (08/31 0616) BP: 121/72 (08/31 0616) Pulse Rate: 52 (08/31 0616)  Labs: Recent Labs    12/23/22 0617  HGB 13.6  HCT 38.6*  PLT 136*  CREATININE 1.04  TROPONINIHS 12    Estimated Creatinine Clearance: 66.2 mL/min (by C-G formula based on SCr of 1.04 mg/dL).   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   Celiac artery dissection (HCC)    Complication of anesthesia    COPD (chronic obstructive pulmonary disease) (HCC)    Coronary artery disease    Diabetes mellitus without complication (HCC)    Diverticulosis    Glaucoma    Hearing impairment    Hypercholesteremia    Hypertension    Insomnia    Meniere syndrome    Thrombocytopenia (HCC)    Vertigo    Wears hearing aid in left ear     Pertinent Medications:  No history of chronic anticoagulant use PTA  Assessment: [facility] who has a recent outpatient stress test showing abnormality concern for ischemia with plan for outpatient catheterization on 911 presents to the ER for worsening chest pain midsternal nonradiating with tightness and pressure felt like someone was sitting on his chest lasting quite some time associated with diaphoresis. Pharmacy has been consulted to initiate and monitor continuous heparin infusion.  Goal of Therapy:  Heparin level 0.3-0.7 units/ml Monitor platelets by anticoagulation protocol: Yes   Plan:  Give 4000 units bolus x 1 Start heparin infusion at 1200 units/hr Check anti-Xa level in 8  hours and daily while on heparin Continue to monitor H&H and platelets  [facility] A Alekai Pocock 12/23/2022,9:12 AM

## 2022-12-23 NOTE — Assessment & Plan Note (Signed)
Stable from respiratory standpoint Continue home inhalers 

## 2022-12-23 NOTE — Assessment & Plan Note (Signed)
SSI  A1C  

## 2022-12-23 NOTE — Assessment & Plan Note (Addendum)
Recurring episodes of nonspecific typical chest pain in the setting of baseline CAD with concern for unstable angina s/p PCI with DES to OM2 at [facility] 09/2011  Recently underwent a nuclear stress test which was grossly positive for a reversible perfusion defect consistent with ischemia.  Troponin negative x 2 EKG relatively stable Heart score 6+ Noted evaluation roughly 1 week ago for similar symptoms with discharge from ER by cardiology Will start on heparin drip Consult cardiology for further evaluation 2D echo Follow up cardiology recommendations

## 2022-12-23 NOTE — H&P (Signed)
History and Physical    Patient: [facility] YNW:[id number] DOB: 1945/05/17 DOA: 12/23/2022 DOS: the patient was seen and examined on 12/23/2022 PCP: Gracelyn Nurse, MD  Patient coming from: Home  Chief Complaint:  Chief Complaint  Patient presents with   Chest Pain   HPI: [facility] is a 77 y.o. male with medical history significant of coronary artery disease status post stenting, COPD, type 2 diabetes, hypertension, hyperlipidemia presenting with unstable angina.  Patient reports episode of midsternal chest pain and tightness yesterday.  Positive diaphoresis.  Had multiple episodes of similar symptoms overnight.  Noted to have been admitted August 22 for similar symptoms.  Had cardiology consult.  Patient was discharged the next day.  Noted recent outpatient stress test showing ischemia.  Tentative plan for cardiac catheterization in September.  No abdominal pain.  No shortness of breath.  No hemiparesis or confusion. Presented to the ER afebrile, hemodynamically stable.  Satting well on room air.  White count 5.1, hemoglobin 13.6, platelets 136.  Creatinine 1.04.  Troponin negative x 2.  EKG normal sinus rhythm.  Chest x-ray stable. Review of Systems: As mentioned in the history of present illness. All other systems reviewed and are negative. 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   Celiac artery dissection (HCC)    Complication of anesthesia    COPD (chronic obstructive pulmonary disease) (HCC)    Coronary artery disease    Diabetes mellitus without complication (HCC)    Diverticulosis    Glaucoma    Hearing impairment    Hypercholesteremia    Hypertension    Insomnia    Meniere syndrome    Thrombocytopenia (HCC)    Vertigo    Wears hearing aid in left ear    Past Surgical History:  Procedure Laterality Date   ABDOMINAL SURGERY     APPENDECTOMY     CATARACT EXTRACTION W/PHACO  Right 02/04/2020   Procedure: CATARACT EXTRACTION PHACO AND INTRAOCULAR LENS PLACEMENT (IOC) RIGHT DIABETIC  6.01  01:11.3;  Surgeon: Lockie Mola, MD;  Location: [facility];  Service: Ophthalmology;  Laterality: Right;  Diabetic - oral meds   COLONOSCOPY     COLONOSCOPY     COLONOSCOPY N/A 05/30/2021   Procedure: COLONOSCOPY;  Surgeon: Jaynie Collins, DO;  Location: [facility];  Service: Gastroenterology;  Laterality: N/A;  DIET CONTROLLED   COLONOSCOPY WITH PROPOFOL N/A 01/22/2018   Procedure: COLONOSCOPY WITH PROPOFOL;  Surgeon: Christena Deem, MD;  Location: [facility] END[facility];  Service: Endoscopy;  Laterality: N/A;   CORONARY ANGIOPLASTY WITH STENT PLACEMENT     Social History:  reports that he quit smoking about 34 years ago. His smoking use included cigarettes. He has quit using smokeless tobacco. He reports current alcohol use of about 3.0 standard drinks of alcohol per week. He reports that he does not use drugs.  Allergies  Allergen Reactions   Lovastatin Other (See Comments)    Other reaction(s): Muscle Pain With higher doses    Family History  Problem Relation Age of Onset   Breast cancer Mother    Prostate cancer Neg Hx    Kidney cancer Neg Hx    Bladder Cancer Neg Hx     Prior to Admission medications   Medication Sig Start Date End Date Taking? Authorizing Provider  albuterol (VENTOLIN HFA) 108 (90 Base) MCG/ACT inhaler Inhale into the lungs  every 6 (six) hours as needed for wheezing or shortness of breath.   Yes [provider]  alfuzosin (UROXATRAL) 10 MG 24 hr tablet Take 1 tablet (10 mg total) by mouth daily with breakfast. 08/23/22  Yes Stoioff, Verna Czech, MD  aspirin EC 81 MG tablet Take 81 mg by mouth daily.   Yes [provider]  BREZTRI AEROSPHERE 160-9-4.8 MCG/ACT AERO Inhale into the lungs. 11/13/22  Yes [provider]  citalopram (CELEXA) 10 MG tablet Take 1 tablet by mouth daily. 11/14/22 11/14/23 Yes  [provider]  clotrimazole-betamethasone (LOTRISONE) cream Apply 1 Application topically 2 (two) times daily. 11/27/22 11/27/23 Yes [provider]  Co-Enzyme Q10 200 MG CAPS Take by mouth.   Yes [provider]  dorzolamide-timolol (COSOPT) 22.3-6.8 MG/ML ophthalmic solution 1 drop 2 (two) times daily.   Yes [provider]  glucosamine-chondroitin 500-400 MG tablet Take 3 tablets by mouth daily.   Yes [provider]  latanoprost (XALATAN) 0.005 % ophthalmic solution 1 drop at bedtime.   Yes [provider]  lovastatin (MEVACOR) 20 MG tablet Take by mouth. 08/25/15  Yes [provider]  metFORMIN (GLUCOPHAGE) 500 MG tablet Take 1 tablet by mouth daily with breakfast. 09/19/22  Yes [provider]  nitroGLYCERIN (NITROSTAT) 0.4 MG SL tablet Place 0.4 mg under the tongue every 5 (five) minutes as needed for chest pain. 12/21/22 12/21/23 Yes [provider]  ranolazine (RANEXA) 500 MG 12 hr tablet Take 1 tablet (500 mg total) by mouth 2 (two) times daily. 12/14/22  Yes Sunnie Nielsen, DO  umeclidinium-vilanterol (ANORO ELLIPTA) 62.5-25 MCG/INH AEPB Inhale 1 puff into the lungs daily.   Yes [provider]  Misc Natural Products (NEURIVA PO) Take by mouth daily.    [provider]    Physical Exam: Vitals:   12/23/22 0616 12/23/22 1030 12/23/22 1121 12/23/22 1215  BP: 121/72 (!) 132/58    Pulse: (!) 52 (!) 57  (!) 59  Resp: 16 12  15   Temp: 97.6 F (36.4 C)  97.7 F (36.5 C)   TempSrc: Oral  Oral   SpO2: 97% 98%  97%  Weight:      Height:       Physical Exam Constitutional:      Appearance: He is obese.  HENT:     Head: Normocephalic and atraumatic.     Nose: Nose normal.     Mouth/Throat:     Mouth: Mucous membranes are moist.  Cardiovascular:     Rate and Rhythm: Normal rate and regular rhythm.  Pulmonary:     Effort: Pulmonary effort is normal.  Abdominal:     General: Abdomen is  flat. Bowel sounds are normal.  Musculoskeletal:     Cervical back: Normal range of motion.  Skin:    General: Skin is warm.  Neurological:     General: No focal deficit present.  Psychiatric:        Mood and Affect: Mood normal.     Data Reviewed:  There are no new results to review at this time. DG Chest 2 View CLINICAL DATA:  Chest pain  EXAM: CHEST - 2 VIEW  COMPARISON:  12/13/2022  FINDINGS: Normal heart size and mediastinal contours. No acute infiltrate or edema. Mild hazy density at the left base from atelectasis or scarring by prior CT. No effusion or pneumothorax. No acute osseous findings. Diffuse degenerative endplate spurring.  IMPRESSION: No active cardiopulmonary disease.  Electronically Signed   By:  Tiburcio Pea M.D.   On: 12/23/2022 06:33  Lab Results  Component Value Date   WBC 5.1 12/23/2022   HGB 13.6 12/23/2022   HCT 38.6 (L) 12/23/2022   MCV 96.3 12/23/2022   PLT 136 (L) 12/23/2022   Last metabolic panel Lab Results  Component Value Date   GLUCOSE 132 (H) 12/23/2022   NA 134 (L) 12/23/2022   K 4.5 12/23/2022   CL 100 12/23/2022   CO2 26 12/23/2022   BUN 17 12/23/2022   CREATININE 1.04 12/23/2022   GFRNONAA >60 12/23/2022   CALCIUM 9.1 12/23/2022   PROT 6.6 12/14/2022   ALBUMIN 3.2 (L) 12/14/2022   BILITOT 0.7 12/14/2022   ALKPHOS 42 12/14/2022   AST 15 12/14/2022   ALT 18 12/14/2022   ANIONGAP 8 12/23/2022    Assessment and Plan: * Unstable angina (HCC) Recurring episodes of nonspecific typical chest pain in the setting of baseline CAD with concern for unstable angina s/p PCI with DES to OM2 at [facility] 09/2011  Recently underwent a nuclear stress test which was grossly positive for a reversible perfusion defect consistent with ischemia.  Troponin negative x 2 EKG relatively stable Heart score 6+ Noted evaluation roughly 1 week ago for similar symptoms with discharge from ER by cardiology Will start on heparin drip Consult  cardiology for further evaluation 2D echo Follow up cardiology recommendations   COPD (chronic obstructive pulmonary disease) (HCC) Stable from respiratory standpoint Continue home inhalers  Diabetes mellitus type 2, uncomplicated (HCC) SSI  A1C    Benign essential hypertension BP stable  Titrate home regimen     Greater than 50% was spent in counseling and coordination of care with patient Total encounter time 80 minutes or more    Advance Care Planning:   Code Status: Full Code   Consults: Cardiology  Family Communication: Wife at the bedside   Severity of Illness: The appropriate patient status for this patient is INPATIENT. Inpatient status is judged to be reasonable and necessary in order to provide the required intensity of service to ensure the patient's safety. The patient's presenting symptoms, physical exam findings, and initial radiographic and laboratory data in the context of their chronic comorbidities is felt to place them at high risk for further clinical deterioration. Furthermore, it is not anticipated that the patient will be medically stable for discharge from the hospital within 2 midnights of admission.   * I certify that at the point of admission it is my clinical judgment that the patient will require inpatient hospital care spanning beyond 2 midnights from the point of admission due to high intensity of service, high risk for further deterioration and high frequency of surveillance required.*  Author: Floydene Flock, MD 12/23/2022 1:38 PM  For on call review [email or web address].

## 2022-12-23 NOTE — Consult Note (Signed)
ANTICOAGULATION CONSULT NOTE  Pharmacy Consult for Heparin Indication: chest pain/ACS  Allergies  Allergen Reactions   Lovastatin Other (See Comments)    Other reaction(s): Muscle Pain With higher doses    Patient Measurements: Height: 5\' 9"  (175.3 cm) Weight: 90.7 kg (200 lb) IBW/kg (Calculated) : 70.7 Heparin Dosing Weight: 89.1 kg  Vital Signs: Temp: 97.7 F (36.5 C) (08/31 1500) Temp Source: Oral (08/31 1500) BP: 130/68 (08/31 1500) Pulse Rate: 69 (08/31 1500)  Labs: Recent Labs    12/23/22 0617 12/23/22 0848 12/23/22 1825  HGB 13.6  --   --   HCT 38.6*  --   --   PLT 136*  --   --   APTT  --  30  --   LABPROT  --  14.0  --   INR  --  1.1  --   HEPARINUNFRC  --   --  0.38  CREATININE 1.04  --   --   TROPONINIHS 12 16  --     Estimated Creatinine Clearance: 66.2 mL/min (by C-G formula based on SCr of 1.04 mg/dL).   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 male who has a recent outpatient stress test showing abnormality concern for ischemia with plan for outpatient catheterization on 911 presents to the ER for worsening chest pain midsternal nonradiating with tightness and pressure felt like someone was sitting on his chest lasting quite some time associated with diaphoresis. Pharmacy has been consulted to initiate and monitor continuous heparin infusion.  Goal of Therapy:  Heparin level  0.3-0.7 units/ml Monitor platelets by anticoagulation protocol: Yes  0831 1825 HL 0.38, therapeutic x 1   Plan:  Anti-Xa level therapeutic x 1 Continue heparin infusion at 1200 units/hr Check anti-Xa level in 8 hours to confirm Continue to monitor H&H and platelets  Barrie Folk, PharmD 12/23/2022,7:05 PM

## 2022-12-23 NOTE — ED Provider Notes (Signed)
[facility] Provider Note    Event Date/Time   First MD Initiated Contact with Patient 12/23/22 [phone]     (approximate)   History   Chest Pain   HPI  Kirk Russell is a 77 y.o. male who has a recent outpatient stress test showing abnormality concern for ischemia with plan for outpatient catheterization on 911 presents to the ER for worsening chest pain midsternal nonradiating with tightness and pressure felt like someone was sitting on his chest lasting quite some time associated with diaphoresis.  Occurred while at rest more severely this morning once at 2:00 and then again at 9 AM.  Has been having increasing episodes over the past few days.  Is pain currently is minimal.     Physical Exam   Triage Vital Signs: ED Triage Vitals  Encounter Vitals Group     BP 12/23/22 0616 121/72     Systolic BP Percentile --      Diastolic BP Percentile --      Pulse Rate 12/23/22 0616 (!) 52     Resp 12/23/22 0616 16     Temp 12/23/22 0616 97.6 F (36.4 C)     Temp Source 12/23/22 0616 Oral     SpO2 12/23/22 0616 97 %     Weight 12/23/22 0606 200 lb (90.7 kg)     Height 12/23/22 0606 5\' 9"  (1.753 m)     Head Circumference --      Peak Flow --      Pain Score 12/23/22 0606 0     Pain Loc --      Pain Education --      Exclude from Growth Chart --     Most recent vital signs: Vitals:   12/23/22 0616  BP: 121/72  Pulse: (!) 52  Resp: 16  Temp: 97.6 F (36.4 C)  SpO2: 97%     Constitutional: Alert  Eyes: Conjunctivae are normal.  Head: Atraumatic. Nose: No congestion/rhinnorhea. Mouth/Throat: Mucous membranes are moist.   Neck: Painless ROM.  Cardiovascular:   Good peripheral circulation. Respiratory: Normal respiratory effort.  No retractions.  Gastrointestinal: Soft and nontender.  Musculoskeletal:  no deformity Neurologic:  MAE spontaneously. No gross focal neurologic deficits are appreciated.  Skin:  Skin is warm, dry and intact.  No rash noted. Psychiatric: Mood and affect are normal. Speech and behavior are normal.    ED Results / Procedures / Treatments   Labs (all labs ordered are listed, but only abnormal results are displayed) Labs Reviewed  BASIC METABOLIC PANEL - Abnormal; Notable for the following components:      Result Value   Sodium 134 (*)    Glucose, Bld 132 (*)    All other components within normal limits  CBC - Abnormal; Notable for the following components:   RBC 4.01 (*)    HCT 38.6 (*)    Platelets 136 (*)    All other components within normal limits  TROPONIN I (HIGH SENSITIVITY)  TROPONIN I (HIGH SENSITIVITY)     EKG  ED ECG REPORT I, Willy Eddy, the attending physician, personally viewed and interpreted this ECG.   Date: 12/23/2022  EKG Time: 6:04  Rate: 50  Rhythm: sinus  Axis: normal  Intervals: normal  ST&T Change: no stemi, nonspecific t wave abn    RADIOLOGY Please see ED Course for my review and interpretation.  I personally reviewed all radiographic images ordered to evaluate for the above acute complaints and reviewed radiology  reports and findings.  These findings were personally discussed with the patient.  Please see medical record for radiology report.    PROCEDURES:  Critical Care performed: No  Procedures   MEDICATIONS ORDERED IN ED: Medications - No data to display   IMPRESSION / MDM / ASSESSMENT AND PLAN / ED COURSE  I reviewed the triage vital signs and the nursing notes.                              Differential diagnosis includes, but is not limited to, ACS, pericarditis, esophagitis, boerhaaves, pe, dissection, pna, bronchitis, costochondritis   Patient presenting to the ER for evaluation of symptoms as described above.  Based on symptoms, risk factors and considered above differential, this presenting complaint could reflect a potentially life-threatening illness therefore the patient will be placed on continuous pulse oximetry  and telemetry for monitoring.  Laboratory evaluation will be sent to evaluate for the above complaints.  Patient with high risk history presenting with worsening typical chest pain concerning for unstable angina now with pain at rest becoming more frequent associate with diaphoresis.  Does not seem consistent with PE or dissection.  His discomfort right now as it is minimal did get improvement after nitro.  Given positive stress test with worsening anginal symptoms and concern for unstable angina will heparinize.  Patient already took aspirin.  Will consult hospitalist for admission.        FINAL CLINICAL IMPRESSION(S) / ED DIAGNOSES   Final diagnoses:  Unstable angina (HCC)     Rx / DC Orders   ED Discharge Orders     None        Note:  This document was prepared using Dragon voice recognition software and may include unintentional dictation errors.    Willy Eddy, MD 12/23/22 [phone]

## 2022-12-24 ENCOUNTER — Inpatient Hospital Stay
Admit: 2022-12-24 | Discharge: 2022-12-24 | Disposition: A | Discharge status: Home / Self Care | Payer: Medicare Other | Attending: Family Medicine | Admitting: Family Medicine

## 2022-12-24 DIAGNOSIS — I2510 Atherosclerotic heart disease of native coronary artery without angina pectoris: Secondary | ICD-10-CM | POA: Diagnosis not present

## 2022-12-24 DIAGNOSIS — R079 Chest pain, unspecified: Secondary | ICD-10-CM | POA: Diagnosis not present

## 2022-12-24 DIAGNOSIS — I200 Unstable angina: Secondary | ICD-10-CM

## 2022-12-24 LAB — ECHOCARDIOGRAM COMPLETE
AR max vel: 2.06 cm2
AV Area VTI: 2.27 cm2
AV Area mean vel: 2.07 cm2
AV Mean grad: 3 mmHg
AV Peak grad: 5.6 mmHg
Ao pk vel: 1.18 m/s
Area-P 1/2: 3.63 cm2
Height: 69 in
MV VTI: 2.59 cm2
P 1/2 time: 441 ms
S' Lateral: 3.3 cm
Weight: 3200 [oz_av]

## 2022-12-24 LAB — CBC
HCT: 35.8 % — ABNORMAL LOW (ref 39.0–52.0)
Hemoglobin: 12.6 g/dL — ABNORMAL LOW (ref 13.0–17.0)
MCH: 34 pg (ref 26.0–34.0)
MCHC: 35.2 g/dL (ref 30.0–36.0)
MCV: 96.5 fL (ref 80.0–100.0)
Platelets: 134 10*3/uL — ABNORMAL LOW (ref 150–400)
RBC: 3.71 MIL/uL — ABNORMAL LOW (ref 4.22–5.81)
RDW: 12 % (ref 11.5–15.5)
WBC: 6.8 10*3/uL (ref 4.0–10.5)
nRBC: 0 % (ref 0.0–0.2)

## 2022-12-24 LAB — COMPREHENSIVE METABOLIC PANEL
ALT: 18 U/L (ref 0–44)
AST: 16 U/L (ref 15–41)
Albumin: 3.3 g/dL — ABNORMAL LOW (ref 3.5–5.0)
Alkaline Phosphatase: 44 U/L (ref 38–126)
Anion gap: 6 (ref 5–15)
BUN: 20 mg/dL (ref 8–23)
CO2: 24 mmol/L (ref 22–32)
Calcium: 8.5 mg/dL — ABNORMAL LOW (ref 8.9–10.3)
Chloride: 100 mmol/L (ref 98–111)
Creatinine, Ser: 0.97 mg/dL (ref 0.61–1.24)
GFR, Estimated: >60 mL/min (ref >60)
Glucose, Bld: 118 mg/dL — ABNORMAL HIGH (ref 70–99)
Potassium: 3.9 mmol/L (ref 3.5–5.1)
Sodium: 130 mmol/L — ABNORMAL LOW (ref 135–145)
Total Bilirubin: 0.7 mg/dL (ref 0.3–1.2)
Total Protein: 6.7 g/dL (ref 6.5–8.1)

## 2022-12-24 LAB — LIPID PANEL
Cholesterol: 143 mg/dL (ref 0–200)
HDL: 51 mg/dL (ref >40)
LDL Cholesterol: 75 mg/dL (ref 0–99)
Total CHOL/HDL Ratio: 2.8 ratio
Triglycerides: 87 mg/dL (ref <150)
VLDL: 17 mg/dL (ref 0–40)

## 2022-12-24 LAB — CBG MONITORING, ED
Glucose-Capillary: 105 mg/dL — ABNORMAL HIGH (ref 70–99)
Glucose-Capillary: 103 mg/dL — ABNORMAL HIGH (ref 70–99)

## 2022-12-24 LAB — HEPARIN LEVEL (UNFRACTIONATED): Heparin Unfractionated: 0.57 [IU]/mL (ref 0.30–0.70)

## 2022-12-24 MED ORDER — LISINOPRIL 2.5 MG PO TABS: 2.5000 mg | ORAL_TABLET | Freq: Every day | ORAL | 1 refills | Status: DC

## 2022-12-24 MED ORDER — RANOLAZINE ER 1000 MG PO TB12: 1000.0000 mg | ORAL_TABLET | Freq: Two times a day (BID) | ORAL | 1 refills | Status: DC

## 2022-12-24 MED ORDER — RANOLAZINE ER 500 MG PO TB12: 1000.0000 mg | ORAL_TABLET | Freq: Two times a day (BID) | ORAL | Status: DC

## 2022-12-24 MED ORDER — CLOPIDOGREL BISULFATE 75 MG PO TABS: 75.0000 mg | ORAL_TABLET | Freq: Every day | ORAL | 1 refills | Status: DC

## 2022-12-24 MED ORDER — ISOSORBIDE MONONITRATE ER 30 MG PO TB24: 30.0000 mg | ORAL_TABLET | Freq: Every day | ORAL | 1 refills | Status: DC

## 2022-12-24 MED ORDER — CLOPIDOGREL BISULFATE 75 MG PO TABS
75.0000 mg | ORAL_TABLET | Freq: Every day | ORAL | Status: DC
  Administered 2022-12-24: 75 mg via ORAL
  Filled 2022-12-24: qty 1

## 2022-12-24 NOTE — Discharge Summary (Addendum)
Physician Discharge Summary  Kaare Weideman Dillinger JYN:[id number] DOB: 09/03/1945 DOA: 12/23/2022  PCP: Gracelyn Nurse, MD  Admit date: 12/23/2022 Discharge date: 12/24/2022  Admitted From: Home Disposition: Home  Recommendations for Outpatient Follow-up:  Follow up with cardiology for heart cath  Discharge Condition: Stable CODE STATUS: Full code Diet recommendation: Heart healthy/carb modified  Brief/Interim Summary: H&P by Dr. Alvester Morin: "Kirk Russell is a 77 y.o. male with medical history significant of coronary artery disease status post stenting, COPD, type 2 diabetes, hypertension, hyperlipidemia presenting with unstable angina.  Patient reports episode of midsternal chest pain and tightness yesterday.  Positive diaphoresis.  Had multiple episodes of similar symptoms overnight.  Noted to have been admitted August 22 for similar symptoms.  Had cardiology consult.  Patient was discharged the next day.  Noted recent outpatient stress test showing ischemia.  Tentative plan for cardiac catheterization in September.  No abdominal pain.  No shortness of breath.  No hemiparesis or confusion. Presented to the ER afebrile, hemodynamically stable.  Satting well on room air.  White count 5.1, hemoglobin 13.6, platelets 136.  Creatinine 1.04.  Troponin negative x 2.  EKG normal sinus rhythm.  Chest x-ray stable."  Patient was evaluated by cardiology.  Echocardiogram was completed.  Troponin remained negative x 2.  He was initially started on IV heparin drip.  On day of discharge, patient remained chest pain-free, improved and stabilized quicker than expected.  After discussion with cardiology, they elected to discharge him on increased Ranexa dosing as well as imdur and plavix and have outpatient cardiac cath as scheduled.  Discharge Diagnoses:   Principal Problem:   Unstable angina (HCC) Active Problems:   Benign essential hypertension   Diabetes mellitus type 2, uncomplicated (HCC)    COPD (chronic obstructive pulmonary disease) (HCC)    Discharge Instructions  Discharge Instructions     Call MD for:  difficulty breathing, headache or visual disturbances   Complete by: As directed    Call MD for:  extreme fatigue   Complete by: As directed    Call MD for:  persistant dizziness or light-headedness   Complete by: As directed    Call MD for:  persistant nausea and vomiting   Complete by: As directed    Call MD for:  severe uncontrolled pain   Complete by: As directed    Call MD for:  temperature >100.4   Complete by: As directed    Diet - low sodium heart healthy   Complete by: As directed    Discharge instructions   Complete by: As directed    You were cared for by a hospitalist during your hospital stay. If you have any questions about your discharge medications or the care you received while you were in the hospital after you are discharged, you can call the unit and ask to speak with the hospitalist on call if the hospitalist that took care of you is not available. Once you are discharged, your primary care physician will handle any further medical issues. Please note that NO REFILLS for any discharge medications will be authorized once you are discharged, as it is imperative that you return to your primary care physician (or establish a relationship with a primary care physician if you do not have one) for your aftercare needs so that they can reassess your need for medications and monitor your lab values.   Increase activity slowly   Complete by: As directed       Allergies as of  12/24/2022       Reactions   Lovastatin Other (See Comments)   Other reaction(s): Muscle Pain With higher doses        Medication List     TAKE these medications    albuterol 108 (90 Base) MCG/ACT inhaler Commonly known as: VENTOLIN HFA Inhale into the lungs every 6 (six) hours as needed for wheezing or shortness of breath.   alfuzosin 10 MG 24 hr tablet Commonly known  as: UROXATRAL Take 1 tablet (10 mg total) by mouth daily with breakfast.   aspirin EC 81 MG tablet Take 81 mg by mouth daily.   Breztri Aerosphere 160-9-4.8 MCG/ACT Aero Generic drug: Budeson-Glycopyrrol-Formoterol Inhale into the lungs.   citalopram 10 MG tablet Commonly known as: CELEXA Take 1 tablet by mouth daily.   clopidogrel 75 MG tablet Commonly known as: PLAVIX Take 1 tablet (75 mg total) by mouth daily. Start taking on: December 25, 2022   clotrimazole-betamethasone cream Commonly known as: LOTRISONE Apply 1 Application topically 2 (two) times daily.   Co-Enzyme Q10 200 MG Caps Take by mouth.   dorzolamide-timolol 2-0.5 % ophthalmic solution Commonly known as: COSOPT 1 drop 2 (two) times daily.   glucosamine-chondroitin 500-400 MG tablet Take 3 tablets by mouth daily.   isosorbide mononitrate 30 MG 24 hr tablet Commonly known as: IMDUR Take 1 tablet (30 mg total) by mouth daily. Start taking on: December 25, 2022   latanoprost 0.005 % ophthalmic solution Commonly known as: XALATAN 1 drop at bedtime.   lisinopril 2.5 MG tablet Commonly known as: ZESTRIL Take 1 tablet (2.5 mg total) by mouth daily. Start taking on: December 25, 2022   lovastatin 20 MG tablet Commonly known as: MEVACOR Take by mouth.   metFORMIN 500 MG tablet Commonly known as: GLUCOPHAGE Take 1 tablet by mouth daily with breakfast.   NEURIVA PO Take by mouth daily.   nitroGLYCERIN 0.4 MG SL tablet Commonly known as: NITROSTAT Place 0.4 mg under the tongue every 5 (five) minutes as needed for chest pain.   ranolazine 1000 MG SR tablet Commonly known as: RANEXA Take 1 tablet (1,000 mg total) by mouth 2 (two) times daily. What changed:  medication strength how much to take   umeclidinium-vilanterol 62.5-25 MCG/INH Aepb Commonly known as: ANORO ELLIPTA Inhale 1 puff into the lungs daily.        Follow-up Information     Gracelyn Nurse, MD Follow up.   Specialty:  Internal Medicine Contact information: [address] [place] [address] [phone]         Clotilde Dieter, DO Follow up.   Specialty: Cardiology Contact information: [address] [place] [address] [phone]                Allergies  Allergen Reactions   Lovastatin Other (See Comments)    Other reaction(s): Muscle Pain With higher doses    Consultations: Cardiology    Procedures/Studies: ECHOCARDIOGRAM COMPLETE  Result Date: 12/24/2022    ECHOCARDIOGRAM REPORT   Patient Name:   Kirk Russell Date of Exam: 12/24/2022 Medical Rec #:  [id number]               Height:       69.0 in Accession #:    [id number]              Weight:       200.0 lb Date of Birth:  [birth year]  BSA:          2.066 m Patient Age:    77 years                BP:           102/71 mmHg Patient Gender: M                       HR:           67 bpm. Exam Location:  [facility] Procedure: 2D Echo, Cardiac Doppler and Color Doppler Indications:     Chest Pain R07.9  History:         Patient has no prior history of Echocardiogram examinations.                  CAD; Risk Factors:Hypertension and Diabetes.  Sonographer:     Neysa Bonito Roar Referring Phys:  [id number] RUEAVW J NEWTON Diagnosing Phys: Adrian Blackwater IMPRESSIONS  1. Left ventricular ejection fraction, by estimation, is 60 to 65%. The left ventricle has normal function. The left ventricle has no regional wall motion abnormalities. Left ventricular diastolic parameters are consistent with Grade I diastolic dysfunction (impaired relaxation).  2. Right ventricular systolic function is normal. The right ventricular size is normal.  3. The mitral valve is normal in structure. Trivial mitral valve regurgitation. No evidence of mitral stenosis.  4. The aortic valve is normal in structure. Aortic valve regurgitation is mild to moderate. Aortic valve sclerosis/calcification is present, without any evidence of aortic stenosis.  5. The  inferior vena cava is normal in size with greater than 50% respiratory variability, suggesting right atrial pressure of 3 mmHg. FINDINGS  Left Ventricle: Left ventricular ejection fraction, by estimation, is 60 to 65%. The left ventricle has normal function. The left ventricle has no regional wall motion abnormalities. The left ventricular internal cavity size was normal in size. There is  no left ventricular hypertrophy. Left ventricular diastolic parameters are consistent with Grade I diastolic dysfunction (impaired relaxation). Right Ventricle: The right ventricular size is normal. No increase in right ventricular wall thickness. Right ventricular systolic function is normal. Left Atrium: Left atrial size was normal in size. Right Atrium: Right atrial size was normal in size. Pericardium: There is no evidence of pericardial effusion. Mitral Valve: The mitral valve is normal in structure. Trivial mitral valve regurgitation. No evidence of mitral valve stenosis. MV peak gradient, 2.6 mmHg. The mean mitral valve gradient is 1.0 mmHg. Tricuspid Valve: The tricuspid valve is normal in structure. Tricuspid valve regurgitation is trivial. No evidence of tricuspid stenosis. Aortic Valve: The aortic valve is normal in structure. Aortic valve regurgitation is mild to moderate. Aortic regurgitation PHT measures 441 msec. Aortic valve sclerosis/calcification is present, without any evidence of aortic stenosis. Aortic valve mean  gradient measures 3.0 mmHg. Aortic valve peak gradient measures 5.6 mmHg. Aortic valve area, by VTI measures 2.27 cm. Pulmonic Valve: The pulmonic valve was normal in structure. Pulmonic valve regurgitation is not visualized. No evidence of pulmonic stenosis. Aorta: The aortic root is normal in size and structure. Venous: The inferior vena cava is normal in size with greater than 50% respiratory variability, suggesting right atrial pressure of 3 mmHg. IAS/Shunts: No atrial level shunt detected by  color flow Doppler.  LEFT VENTRICLE PLAX 2D LVIDd:         4.60 cm   Diastology LVIDs:         3.30 cm   LV e' medial:  8.92 cm/s LV PW:         1.10 cm   LV E/e' medial:  4.6 LV IVS:        1.10 cm   LV e' lateral:   9.14 cm/s LVOT diam:     2.00 cm   LV E/e' lateral: 4.5 LV SV:         53 LV SV Index:   26 LVOT Area:     3.14 cm  RIGHT VENTRICLE RV Basal diam:  3.00 cm RV Mid diam:    2.70 cm RV S prime:     15.90 cm/s TAPSE (M-mode): 2.3 cm LEFT ATRIUM             Index        RIGHT ATRIUM           Index LA diam:        3.90 cm 1.89 cm/m   RA Area:     13.00 cm LA Vol (A2C):   46.8 ml 22.65 ml/m  RA Volume:   24.90 ml  12.05 ml/m LA Vol (A4C):   46.3 ml 22.41 ml/m LA Biplane Vol: 48.4 ml 23.43 ml/m  AORTIC VALVE                    PULMONIC VALVE AV Area (Vmax):    2.06 cm     PV Vmax:          0.96 m/s AV Area (Vmean):   2.07 cm     PV Peak grad:     3.7 mmHg AV Area (VTI):     2.27 cm     PR End Diast Vel: 4.08 msec AV Vmax:           118.00 cm/s  RVOT Peak grad:   1 mmHg AV Vmean:          80.400 cm/s AV VTI:            0.235 m AV Peak Grad:      5.6 mmHg AV Mean Grad:      3.0 mmHg LVOT Vmax:         77.40 cm/s LVOT Vmean:        52.900 cm/s LVOT VTI:          0.170 m LVOT/AV VTI ratio: 0.72 AI PHT:            441 msec  AORTA Ao Root diam: 2.70 cm Ao Asc diam:  3.40 cm MITRAL VALVE               TRICUSPID VALVE MV Area (PHT): 3.63 cm    TR Peak grad:   21.9 mmHg MV Area VTI:   2.59 cm    TR Vmax:        234.00 cm/s MV Peak grad:  2.6 mmHg MV Mean grad:  1.0 mmHg    SHUNTS MV Vmax:       0.80 m/s    Systemic VTI:  0.17 m MV Vmean:      41.7 cm/s   Systemic Diam: 2.00 cm MV Decel Time: 209 msec MV E velocity: 41.40 cm/s MV A velocity: 58.20 cm/s MV E/A ratio:  0.71 MV A Prime:    16.9 cm/s Adrian Blackwater Electronically signed by Adrian Blackwater Signature Date/Time: 12/24/2022/11:13:04 AM    Final    DG Chest 2 View  Result Date: 12/23/2022 CLINICAL DATA:  Chest pain EXAM: CHEST - 2 VIEW COMPARISON:   12/13/2022  FINDINGS: Normal heart size and mediastinal contours. No acute infiltrate or edema. Mild hazy density at the left base from atelectasis or scarring by prior CT. No effusion or pneumothorax. No acute osseous findings. Diffuse degenerative endplate spurring. IMPRESSION: No active cardiopulmonary disease. Electronically Signed   By: Tiburcio Pea M.D.   On: 12/23/2022 06:33   CT Angio Chest PE W and/or Wo Contrast  Result Date: 12/14/2022 CLINICAL DATA:  Low to intermediate probably pulmonary embolism, positive D-dimer, chest pain EXAM: CT ANGIOGRAPHY CHEST WITH CONTRAST TECHNIQUE: Multidetector CT imaging of the chest was performed using the standard protocol during bolus administration of intravenous contrast. Multiplanar CT image reconstructions and MIPs were obtained to evaluate the vascular anatomy. RADIATION DOSE REDUCTION: This exam was performed according to the departmental dose-optimization program which includes automated exposure control, adjustment of the mA and/or kV according to patient size and/or use of iterative reconstruction technique. CONTRAST:  75mL OMNIPAQUE IOHEXOL 350 MG/ML SOLN COMPARISON:  08/02/2021 FINDINGS: Cardiovascular: Adequate opacification of the pulmonary arterial tree. No intraluminal filling defect identified to suggest acute pulmonary embolism. Central pulmonary arteries are of normal caliber. Extensive multi-vessel coronary artery calcification. Cardiac size within normal limits. No pericardial effusion. Mild atherosclerotic calcification within the thoracic aorta. No aortic aneurysm. Mediastinum/Nodes: No enlarged mediastinal, hilar, or axillary lymph nodes. Thyroid gland, trachea, and esophagus demonstrate no significant findings. Lungs/Pleura: Stable lingular scarring. Lungs are otherwise clear. No pneumothorax or pleural effusion. No central obstructing lesion. Upper Abdomen: No acute abnormality. Musculoskeletal: No acute bone abnormality. No lytic or  blastic bone lesion. Review of the MIP images confirms the above findings. IMPRESSION: 1. No pulmonary embolism. No acute intrathoracic pathology identified. 2. Extensive multi-vessel coronary artery calcification. Aortic Atherosclerosis (ICD10-I70.0). Electronically Signed   By: Helyn Numbers M.D.   On: 12/14/2022 03:21   DG Chest 2 View  Result Date: 12/13/2022 CLINICAL DATA:  Chest pain EXAM: CHEST - 2 VIEW COMPARISON:  CT 08/02/2021 FINDINGS: Stable mild left basilar parenchymal scarring. Lungs are otherwise clear. No pneumothorax or pleural effusion. Cardiac size within normal limits. Pulmonary vascularity is normal. Osseous structures are age-appropriate. No acute bone abnormality. IMPRESSION: 1. No active cardiopulmonary disease. Electronically Signed   By: Helyn Numbers M.D.   On: 12/13/2022 23:43      Discharge Exam: Vitals:   12/24/22 0751 12/24/22 0800  BP: 102/71 98/65  Pulse: 71   Resp: 14 (!) 3  Temp: 98.3 F (36.8 C)   SpO2: 94%     General: Pt is alert, awake, not in acute distress Cardiovascular: RRR, S1/S2 +, no edema Respiratory: CTA bilaterally, no wheezing, no rhonchi, no respiratory distress, no conversational dyspnea  Abdominal: Soft, NT, ND, bowel sounds + Extremities: no edema, no cyanosis Psych: Normal mood and affect, stable judgement and insight     The results of significant diagnostics from this hospitalization (including imaging, microbiology, ancillary and laboratory) are listed below for reference.     Microbiology: No results found for this or any previous visit (from the past 240 hour(s)).   Labs: BNP (last 3 results) No results for input(s): "BNP" in the last 8760 hours. Basic Metabolic Panel: Recent Labs  Lab 12/23/22 0617 12/24/22 0241  NA 134* 130*  K 4.5 3.9  CL 100 100  CO2 26 24  GLUCOSE 132* 118*  BUN 17 20  CREATININE 1.04 0.97  CALCIUM 9.1 8.5*   Liver Function Tests: Recent Labs  Lab 12/24/22 0241  AST 16  ALT 18   ALKPHOS  44  BILITOT 0.7  PROT 6.7  ALBUMIN 3.3*   No results for input(s): "LIPASE", "AMYLASE" in the last 168 hours. No results for input(s): "AMMONIA" in the last 168 hours. CBC: Recent Labs  Lab 12/23/22 0617 12/24/22 0241  WBC 5.1 6.8  HGB 13.6 12.6*  HCT 38.6* 35.8*  MCV 96.3 96.5  PLT 136* 134*   Cardiac Enzymes: No results for input(s): "CKTOTAL", "CKMB", "CKMBINDEX", "TROPONINI" in the last 168 hours. BNP: Invalid input(s): "POCBNP" CBG: Recent Labs  Lab 12/24/22 0751 12/24/22 1206  GLUCAP 105* 103*   D-Dimer No results for input(s): "DDIMER" in the last 72 hours. Hgb A1c No results for input(s): "HGBA1C" in the last 72 hours. Lipid Profile Recent Labs    12/24/22 0241  CHOL 143  HDL 51  LDLCALC 75  TRIG 87  CHOLHDL 2.8   Thyroid function studies No results for input(s): "TSH", "T4TOTAL", "T3FREE", "THYROIDAB" in the last 72 hours.  Invalid input(s): "FREET3" Anemia work up No results for input(s): "VITAMINB12", "FOLATE", "FERRITIN", "TIBC", "IRON", "RETICCTPCT" in the last 72 hours. Urinalysis    Component Value Date/Time   APPEARANCEUR Clear 10/08/2018 1448   GLUCOSEU Trace (A) 10/08/2018 1448   BILIRUBINUR Negative 10/08/2018 1448   PROTEINUR Negative 10/08/2018 1448   NITRITE Negative 10/08/2018 1448   LEUKOCYTESUR Negative 10/08/2018 1448   Sepsis Labs Recent Labs  Lab 12/23/22 0617 12/24/22 0241  WBC 5.1 6.8   Microbiology No results found for this or any previous visit (from the past 240 hour(s)).   Patient was seen and examined on the day of discharge and was found to be in stable condition. Time coordinating discharge: 40 minutes including assessment and coordination of care, as well as examination of the patient.   SIGNED:  Noralee Stain, DO [facility] 12/24/2022, 1:02 PM

## 2022-12-24 NOTE — Progress Notes (Signed)
SUBJECTIVE: Patient denies any further chest pain or shortness of breath   Vitals:   12/24/22 0600 12/24/22 0630 12/24/22 0700 12/24/22 0751  BP: (!) 106/57 113/68 119/70 102/71  Pulse: 63 78  71  Resp: 14 18 10 14   Temp:    98.3 F (36.8 C)  TempSrc:    Oral  SpO2: 96% 93%  94%  Weight:      Height:       No intake or output data in the 24 hours ending 12/24/22 1131  LABS: Basic Metabolic Panel: Recent Labs    12/23/22 0617 12/24/22 0241  NA 134* 130*  K 4.5 3.9  CL 100 100  CO2 26 24  GLUCOSE 132* 118*  BUN 17 20  CREATININE 1.04 0.97  CALCIUM 9.1 8.5*   Liver Function Tests: Recent Labs    12/24/22 0241  AST 16  ALT 18  ALKPHOS 44  BILITOT 0.7  PROT 6.7  ALBUMIN 3.3*   No results for input(s): "LIPASE", "AMYLASE" in the last 72 hours. CBC: Recent Labs    12/23/22 0617 12/24/22 0241  WBC 5.1 6.8  HGB 13.6 12.6*  HCT 38.6* 35.8*  MCV 96.3 96.5  PLT 136* 134*   Cardiac Enzymes: No results for input(s): "CKTOTAL", "CKMB", "CKMBINDEX", "TROPONINI" in the last 72 hours. BNP: Invalid input(s): "POCBNP" D-Dimer: No results for input(s): "DDIMER" in the last 72 hours. Hemoglobin A1C: No results for input(s): "HGBA1C" in the last 72 hours. Fasting Lipid Panel: Recent Labs    12/24/22 0241  CHOL 143  HDL 51  LDLCALC 75  TRIG 87  CHOLHDL 2.8   Thyroid Function Tests: No results for input(s): "TSH", "T4TOTAL", "T3FREE", "THYROIDAB" in the last 72 hours.  Invalid input(s): "FREET3" Anemia Panel: No results for input(s): "VITAMINB12", "FOLATE", "FERRITIN", "TIBC", "IRON", "RETICCTPCT" in the last 72 hours.   PHYSICAL EXAM General: Well developed, well nourished, in no acute distress HEENT:  Normocephalic and atramatic Neck:  No JVD.  Lungs: Clear bilaterally to auscultation and percussion. Heart: HRRR . Normal S1 and S2 without gallops or murmurs.  Abdomen: Bowel sounds are positive, abdomen soft and non-tender  Msk:  Back normal, normal  gait. Normal strength and tone for age. Extremities: No clubbing, cyanosis or edema.   Neuro: Alert and oriented X 3. Psych:  Good affect, responds appropriately  TELEMETRY: Normal sinus rhythm  ASSESSMENT AND PLAN: Crescendo anginal with positive stress test already scheduled for cardiac catheterization in mid September.  Patient has ruled out for myocardial infarction and wants to go home instead of having cardiac catheterization this Tuesday.  Patient was started on Plavix isosorbide and increase ranolazine to 1000 mg twice daily.  Patient will have cardiac catheterization as scheduled and can be discharged today with a follow-up to see cardiology.   ICD-10-CM   1. Unstable angina (HCC)  I20.0       Principal Problem:   Unstable angina (HCC) Active Problems:   Benign essential hypertension   Diabetes mellitus type 2, uncomplicated (HCC)   COPD (chronic obstructive pulmonary disease) (HCC)    Adrian Blackwater, MD, [employer] 12/24/2022 11:31 AM

## 2022-12-24 NOTE — Progress Notes (Signed)
  Echocardiogram 2D Echocardiogram has been performed.  Kirk Russell 12/24/2022, 9:41 AM

## 2022-12-24 NOTE — Consult Note (Signed)
ANTICOAGULATION CONSULT NOTE  Pharmacy Consult for Heparin Indication: chest pain/ACS  Allergies  Allergen Reactions   Lovastatin Other (See Comments)    Other reaction(s): Muscle Pain With higher doses    Patient Measurements: Height: 5\' 9"  (175.3 cm) Weight: 90.7 kg (200 lb) IBW/kg (Calculated) : 70.7 Heparin Dosing Weight: 89.1 kg  Vital Signs: Temp: 97.8 F (36.6 C) (08/31 1915) BP: 96/60 (09/01 0300) Pulse Rate: 71 (09/01 0300)  Labs: Recent Labs    12/23/22 0617 12/23/22 0848 12/23/22 1825 12/24/22 0241  HGB 13.6  --   --  12.6*  HCT 38.6*  --   --  35.8*  PLT 136*  --   --  134*  APTT  --  30  --   --   LABPROT  --  14.0  --   --   INR  --  1.1  --   --   HEPARINUNFRC  --   --  0.38 0.57  CREATININE 1.04  --   --  0.97  TROPONINIHS 12 16  --   --     Estimated Creatinine Clearance: 71 mL/min (by C-G formula based on SCr of 0.97 mg/dL).   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 male who has a recent outpatient stress test showing abnormality concern for ischemia with plan for outpatient catheterization on 911 presents to the ER for worsening chest pain midsternal nonradiating with tightness and pressure felt like someone was sitting on his chest lasting quite some time associated with diaphoresis. Pharmacy has been consulted to initiate and monitor continuous heparin infusion.  Goal  of Therapy:  Heparin level 0.3-0.7 units/ml Monitor platelets by anticoagulation protocol: Yes  0831 1825 HL 0.38, therapeutic x 1 0901 0241 HL 0.57, therapeutic x 2   Plan:  Anti-Xa level therapeutic x 2 Continue heparin infusion at 1200 units/hr Recheck HL daily w/ AM labs while therapeutic Continue to monitor H&H and platelets  Otelia Sergeant, PharmD, [facility] 12/24/2022 3:15 AM

## 2022-12-27 ENCOUNTER — Other Ambulatory Visit: Payer: Self-pay

## 2022-12-27 ENCOUNTER — Inpatient Hospital Stay
Admission: RE | Admit: 2022-12-27 | Discharge: 2022-12-27 | DRG: 287 | Disposition: A | Discharge status: Other Acute Inpatient Hospital | Payer: Medicare Other | Attending: Internal Medicine | Admitting: Internal Medicine

## 2022-12-27 ENCOUNTER — Encounter
Admission: RE | Disposition: A | Discharge status: Other Acute Inpatient Hospital | Payer: Self-pay | Source: Home / Self Care | Attending: Internal Medicine

## 2022-12-27 ENCOUNTER — Encounter: Payer: Self-pay | Admitting: Internal Medicine

## 2022-12-27 ENCOUNTER — Encounter (HOSPITAL_COMMUNITY): Payer: Self-pay

## 2022-12-27 DIAGNOSIS — Z79899 Other long term (current) drug therapy: Secondary | ICD-10-CM | POA: Diagnosis not present

## 2022-12-27 DIAGNOSIS — Z888 Allergy status to other drugs, medicaments and biological substances status: Secondary | ICD-10-CM

## 2022-12-27 DIAGNOSIS — Z7951 Long term (current) use of inhaled steroids: Secondary | ICD-10-CM

## 2022-12-27 DIAGNOSIS — I25110 Atherosclerotic heart disease of native coronary artery with unstable angina pectoris: Principal | ICD-10-CM | POA: Diagnosis present

## 2022-12-27 DIAGNOSIS — Z7984 Long term (current) use of oral hypoglycemic drugs: Secondary | ICD-10-CM

## 2022-12-27 DIAGNOSIS — Z7982 Long term (current) use of aspirin: Secondary | ICD-10-CM

## 2022-12-27 DIAGNOSIS — R9430 Abnormal result of cardiovascular function study, unspecified: Secondary | ICD-10-CM

## 2022-12-27 DIAGNOSIS — Z7902 Long term (current) use of antithrombotics/antiplatelets: Secondary | ICD-10-CM | POA: Diagnosis not present

## 2022-12-27 DIAGNOSIS — I200 Unstable angina: Principal | ICD-10-CM | POA: Diagnosis present

## 2022-12-27 HISTORY — PX: LEFT HEART CATH AND CORONARY ANGIOGRAPHY: CATH118249

## 2022-12-27 LAB — CBC
HCT: 37.6 % — ABNORMAL LOW (ref 39.0–52.0)
Hemoglobin: 13.4 g/dL (ref 13.0–17.0)
MCH: 33.8 pg (ref 26.0–34.0)
MCHC: 35.6 g/dL (ref 30.0–36.0)
MCV: 94.7 fL (ref 80.0–100.0)
Platelets: 128 10*3/uL — ABNORMAL LOW (ref 150–400)
RBC: 3.97 MIL/uL — ABNORMAL LOW (ref 4.22–5.81)
RDW: 11.6 % (ref 11.5–15.5)
WBC: 4.9 10*3/uL (ref 4.0–10.5)
nRBC: 0 % (ref 0.0–0.2)

## 2022-12-27 LAB — GLUCOSE, CAPILLARY
Glucose-Capillary: 108 mg/dL — ABNORMAL HIGH (ref 70–99)
Glucose-Capillary: 102 mg/dL — ABNORMAL HIGH (ref 70–99)

## 2022-12-27 LAB — APTT: aPTT: 32 s (ref 24–36)

## 2022-12-27 LAB — PROTIME-INR
INR: 1.1 (ref 0.8–1.2)
Prothrombin Time: 14.1 s (ref 11.4–15.2)

## 2022-12-27 LAB — CARDIAC CATHETERIZATION: Cath EF Quantitative: 55 %

## 2022-12-27 SURGERY — LEFT HEART CATH AND CORONARY ANGIOGRAPHY
Anesthesia: Moderate Sedation | Laterality: Left

## 2022-12-27 MED ORDER — SODIUM CHLORIDE 0.9 % WEIGHT BASED INFUSION: 1.0000 mL/kg/h | INTRAVENOUS | Status: DC

## 2022-12-27 MED ORDER — MIDAZOLAM HCL 2 MG/2ML IJ SOLN
INTRAMUSCULAR | Status: DC | PRN
  Administered 2022-12-27: 1 mg via INTRAVENOUS

## 2022-12-27 MED ORDER — FENTANYL CITRATE (PF) 100 MCG/2ML IJ SOLN
INTRAMUSCULAR | Status: DC | PRN
  Administered 2022-12-27: 50 ug via INTRAVENOUS

## 2022-12-27 MED ORDER — ASPIRIN 81 MG PO TBEC: 81.0000 mg | DELAYED_RELEASE_TABLET | Freq: Every day | ORAL | Status: DC

## 2022-12-27 MED ORDER — ALBUTEROL SULFATE (2.5 MG/3ML) 0.083% IN NEBU: 3.0000 mL | INHALATION_SOLUTION | Freq: Four times a day (QID) | RESPIRATORY_TRACT | Status: DC | PRN

## 2022-12-27 MED ORDER — SODIUM CHLORIDE 0.9 % WEIGHT BASED INFUSION
3.0000 mL/kg/h | INTRAVENOUS | Status: AC
  Administered 2022-12-27: 3 mL/kg/h via INTRAVENOUS

## 2022-12-27 MED ORDER — HEPARIN (PORCINE) 25000 UT/250ML-% IV SOLN: 12.0000 [IU]/kg/h | INTRAVENOUS | 0 refills | Status: DC

## 2022-12-27 MED ORDER — SODIUM CHLORIDE 0.9% FLUSH: 3.0000 mL | INTRAVENOUS | Status: DC | PRN

## 2022-12-27 MED ORDER — ALFUZOSIN HCL ER 10 MG PO TB24
10.0000 mg | ORAL_TABLET | Freq: Every day | ORAL | Status: DC
  Filled 2022-12-27: qty 1

## 2022-12-27 MED ORDER — HEPARIN SODIUM (PORCINE) 1000 UNIT/ML IJ SOLN
INTRAMUSCULAR | Status: AC
  Filled 2022-12-27: qty 10

## 2022-12-27 MED ORDER — HYDRALAZINE HCL 20 MG/ML IJ SOLN: 10.0000 mg | INTRAMUSCULAR | Status: AC | PRN

## 2022-12-27 MED ORDER — CITALOPRAM HYDROBROMIDE 10 MG PO TABS
10.0000 mg | ORAL_TABLET | Freq: Every day | ORAL | Status: DC
  Filled 2022-12-27: qty 1

## 2022-12-27 MED ORDER — ACETAMINOPHEN 325 MG PO TABS: 650.0000 mg | ORAL_TABLET | ORAL | Status: DC | PRN

## 2022-12-27 MED ORDER — LABETALOL HCL 5 MG/ML IV SOLN: 10.0000 mg | INTRAVENOUS | Status: AC | PRN

## 2022-12-27 MED ORDER — LIDOCAINE HCL 1 % IJ SOLN
INTRAMUSCULAR | Status: AC
  Filled 2022-12-27: qty 20

## 2022-12-27 MED ORDER — IOHEXOL 300 MG/ML  SOLN
INTRAMUSCULAR | Status: DC | PRN
  Administered 2022-12-27: 70 mL

## 2022-12-27 MED ORDER — LISINOPRIL 5 MG PO TABS
2.5000 mg | ORAL_TABLET | Freq: Every day | ORAL | Status: DC
  Administered 2022-12-27: 2.5 mg via ORAL
  Filled 2022-12-27: qty 1

## 2022-12-27 MED ORDER — ASPIRIN 81 MG PO CHEW
81.0000 mg | CHEWABLE_TABLET | ORAL | Status: AC
  Administered 2022-12-27: 81 mg via ORAL

## 2022-12-27 MED ORDER — RANOLAZINE ER 500 MG PO TB12
1000.0000 mg | ORAL_TABLET | Freq: Two times a day (BID) | ORAL | Status: DC
  Administered 2022-12-27: 1000 mg via ORAL
  Filled 2022-12-27: qty 2

## 2022-12-27 MED ORDER — SODIUM CHLORIDE 0.9 % IV SOLN: 250.0000 mL | INTRAVENOUS | Status: DC | PRN

## 2022-12-27 MED ORDER — VERAPAMIL HCL 2.5 MG/ML IV SOLN
INTRAVENOUS | Status: DC | PRN
  Administered 2022-12-27: 2.5 mg via INTRA_ARTERIAL

## 2022-12-27 MED ORDER — VERAPAMIL HCL 2.5 MG/ML IV SOLN
INTRAVENOUS | Status: AC
  Filled 2022-12-27: qty 2

## 2022-12-27 MED ORDER — MIDAZOLAM HCL 2 MG/2ML IJ SOLN
INTRAMUSCULAR | Status: AC
  Filled 2022-12-27: qty 2

## 2022-12-27 MED ORDER — LATANOPROST 0.005 % OP SOLN
1.0000 [drp] | Freq: Every day | OPHTHALMIC | Status: DC
  Filled 2022-12-27: qty 2.5

## 2022-12-27 MED ORDER — GLUCOSAMINE-CHONDROITIN 500-400 MG PO TABS: 3.0000 | ORAL_TABLET | Freq: Every day | ORAL | Status: DC

## 2022-12-27 MED ORDER — ROSUVASTATIN CALCIUM 10 MG PO TABS
40.0000 mg | ORAL_TABLET | Freq: Every day | ORAL | Status: DC
  Administered 2022-12-27: 40 mg via ORAL
  Filled 2022-12-27: qty 4

## 2022-12-27 MED ORDER — ASPIRIN 81 MG PO CHEW
CHEWABLE_TABLET | ORAL | Status: AC
  Filled 2022-12-27: qty 1

## 2022-12-27 MED ORDER — HEPARIN (PORCINE) IN NACL 2000-0.9 UNIT/L-% IV SOLN
INTRAVENOUS | Status: DC | PRN
  Administered 2022-12-27: 1000 mL

## 2022-12-27 MED ORDER — ONDANSETRON HCL 4 MG/2ML IJ SOLN
4.0000 mg | Freq: Four times a day (QID) | INTRAMUSCULAR | Status: DC | PRN
  Administered 2022-12-27: 4 mg via INTRAVENOUS
  Filled 2022-12-27: qty 2

## 2022-12-27 MED ORDER — ISOSORBIDE MONONITRATE ER 30 MG PO TB24
30.0000 mg | ORAL_TABLET | Freq: Every day | ORAL | Status: DC
  Administered 2022-12-27: 30 mg via ORAL
  Filled 2022-12-27: qty 1

## 2022-12-27 MED ORDER — DORZOLAMIDE HCL-TIMOLOL MAL 2-0.5 % OP SOLN
1.0000 [drp] | Freq: Two times a day (BID) | OPHTHALMIC | Status: DC
  Filled 2022-12-27: qty 10

## 2022-12-27 MED ORDER — NITROGLYCERIN 0.4 MG SL SUBL: 0.4000 mg | SUBLINGUAL_TABLET | SUBLINGUAL | Status: DC | PRN

## 2022-12-27 MED ORDER — SODIUM CHLORIDE 0.9% FLUSH: 3.0000 mL | Freq: Two times a day (BID) | INTRAVENOUS | Status: DC

## 2022-12-27 MED ORDER — UMECLIDINIUM-VILANTEROL 62.5-25 MCG/ACT IN AEPB
1.0000 | INHALATION_SPRAY | Freq: Every day | RESPIRATORY_TRACT | Status: DC
  Filled 2022-12-27: qty 14

## 2022-12-27 MED ORDER — HEPARIN (PORCINE) 25000 UT/250ML-% IV SOLN
1200.0000 [IU]/h | INTRAVENOUS | Status: DC
  Administered 2022-12-27: 1200 [IU]/h via INTRAVENOUS
  Filled 2022-12-27: qty 250

## 2022-12-27 MED ORDER — LIDOCAINE HCL (PF) 1 % IJ SOLN
INTRAMUSCULAR | Status: DC | PRN
  Administered 2022-12-27: 2 mL

## 2022-12-27 MED ORDER — ASPIRIN 81 MG PO CHEW: 81.0000 mg | CHEWABLE_TABLET | Freq: Every day | ORAL | Status: DC

## 2022-12-27 MED ORDER — ROSUVASTATIN CALCIUM 40 MG PO TABS: 40.0000 mg | ORAL_TABLET | Freq: Every day | ORAL | 0 refills | Status: AC

## 2022-12-27 MED ORDER — FENTANYL CITRATE (PF) 100 MCG/2ML IJ SOLN
INTRAMUSCULAR | Status: AC
  Filled 2022-12-27: qty 2

## 2022-12-27 MED ORDER — HEPARIN SODIUM (PORCINE) 1000 UNIT/ML IJ SOLN
INTRAMUSCULAR | Status: DC | PRN
  Administered 2022-12-27: 4000 [IU] via INTRAVENOUS

## 2022-12-27 SURGICAL SUPPLY — 11 items
CATH 5FR JL3.5 JR4 ANG PIG MP (CATHETERS) IMPLANT
DEVICE RAD TR BAND REGULAR (VASCULAR PRODUCTS) IMPLANT
DRAPE BRACHIAL (DRAPES) IMPLANT
GLIDESHEATH SLEND SS 6F .021 (SHEATH) IMPLANT
GUIDEWIRE INQWIRE 1.5J.035X260 (WIRE) IMPLANT
INQWIRE 1.5J .035X260CM (WIRE) ×1
PACK CARDIAC CATH (CUSTOM PROCEDURE TRAY) ×1 IMPLANT
PROTECTION STATION PRESSURIZED (MISCELLANEOUS) ×1
SET ATX-X65L (MISCELLANEOUS) IMPLANT
STATION PROTECTION PRESSURIZED (MISCELLANEOUS) IMPLANT
WIRE HITORQ VERSACORE ST 145CM (WIRE) IMPLANT

## 2022-12-27 NOTE — Progress Notes (Signed)
Report given to [facility]: spoke with Huntley Dec in the [employer]. I was then transferred to [facility] and spoke with Tawanna Cooler, Consulting civil engineer & gave pt. Full report. [facility] [phone]. Per Todd,[facility] to pick up pt. After midnight tonight. I then called [facility] Floor 7100 and Spoke with Ulyses Amor, RN & gave full report to him. Per [facility] cath lab team, receiving MD, Dr. Lyndel Safe, cardiologist, has already received and viewed pt. Cath films.

## 2022-12-27 NOTE — Consult Note (Signed)
ANTICOAGULATION CONSULT NOTE  Pharmacy Consult for IV Heparin Indication: chest pain/ACS  Patient Measurements: Height: 5\' 9"  (175.3 cm) Weight: 88.5 kg (195 lb) IBW/kg (Calculated) : 70.7 Heparin Dosing Weight: 88.4 kg  Labs: No results for input(s): "HGB", "HCT", "PLT", "APTT", "LABPROT", "INR", "HEPARINUNFRC", "HEPRLOWMOCWT", "CREATININE", "CKTOTAL", "CKMB", "TROPONINIHS" in the last 72 hours.  Estimated Creatinine Clearance: 70.2 mL/min (by C-G formula based on SCr of 0.97 mg/dL).  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 ears     Medications:  No anticoagulation prior to admission per my chart review  Assessment: 77 y/o M with medical history as above and including unstable angina / CAD admitted post-cardiac catheterization procedure with findings of multi-vessel disease. Cardiology is recommending evaluation at a tertiary center for potential bypass. Pharmacy consulted to initiate and manage heparin infusion.   Baseline aPTT, INR, CBC are pending.  Patient received 5000 units of heparin during procedure.  Patient was recently admitted 8/31 thru 9/1 for unstable angina and placed on a heparin drip at that time on which he was therapeutic at 1200 units/hr.  Discussed with RN caring for patient, anticipated TR band will be removed by 6 pm.  Goal of Therapy:  Heparin level 0.3-0.7 units/ml Monitor platelets by anticoagulation protocol: Yes   Plan:  --Start heparin infusion at 1200 units/hr at 2000,  2 hours after TR band removal --Heparin level 8 hours from initiation of infusion --Daily CBC per protocol while on IV heparin  Tressie Ellis 12/27/2022,5:11 PM

## 2022-12-27 NOTE — CV Procedure (Signed)
Patient cardiac cath because of unstable angina known coronary disease  Patient brought to the cardiac Cath Lab right radial approach with diagnostic cath possible PCI and stent  Left ventriculogram Normal left ventricular function EF of at least 55% no wall motion normalities  Coronaries Left main large moderate to severe calcification distal 90%  LAD large ostial 90% mild irregularities throughout  Circumflex large ostial 90% left dominant system  RCA small nondominant CTO occluded proximally   Intervention deferred not indicated Recommend referral to tertiary care center for possible coronary bypass surgery  Case discussed with [facility] Dr. Dyke Brackett who concurred that the patient would probably benefit from coronary bypass surgery  Contact was made with transfer center to help assist with transfer to [facility]  Case discussed with patient and family  After sheath removal and TR band placed recommend IV heparin as per pharmacy until transfer  Full cath note to follow

## 2022-12-28 ENCOUNTER — Encounter: Payer: Self-pay | Admitting: Internal Medicine

## 2023-01-02 NOTE — Discharge Summary (Signed)
Physician Discharge Summary      Patient ID: Maxym Jimeno MRN: [id number] DOB/AGE: 77/28/1947 77 y.o.  Admit date: 12/27/2022 Discharge date: 12/27/2022  Primary Discharge Diagnosis unstable angina coronary artery disease Secondary Discharge Diagnosis two-vessel coronary disease including left main disease  Significant Diagnostic Studies: Cardiac cath  Consults: None  Hospital Course: Originally admitted for unstable angina for cardiac cath patient underwent diagnostic procedure or found to have multivessel coronary disease including left main patient was then admitted for prepared for transfer to [facility] for coronary bypass surgery after discussion with interventionalists at [facility] Dr. Dyke Brackett accepted the patient in transfer.  Patient remained stable but was at high risk for catastrophic event because of severe left main disease found on cardiac catheterization.  Patient had preserved left ventricular function.  Heparin was resumed after the cath prior to transfer because of the unstable nature of the patient's lesions   Discharge Exam: Blood pressure 132/73, pulse 65, temperature 97.8 F (36.6 C), resp. rate 18, height 5\' 9"  (1.753 m), weight 88.5 kg, SpO2 96%.   General appearance: appears stated age Neck: no adenopathy, no carotid bruit, no JVD, supple, symmetrical, trachea midline, and thyroid not enlarged, symmetric, no tenderness/mass/nodules Resp: clear to auscultation bilaterally Cardio: regular rate and rhythm, S1, S2 normal, no murmur, click, rub or gallop GI: soft, non-tender; bowel sounds normal; no masses,  no organomegaly Extremities: extremities normal, atraumatic, no cyanosis or edema Pulses: 2+ and symmetric Neurologic: Alert and oriented X 3, normal strength and tone. Normal symmetric reflexes. Normal coordination and gait Labs:   Lab Results  Component Value Date   WBC 4.9 12/27/2022   HGB 13.4 12/27/2022   HCT 37.6 (L) 12/27/2022   MCV 94.7  12/27/2022   PLT 128 (L) 12/27/2022   No results for input(s): "NA", "K", "CL", "CO2", "BUN", "CREATININE", "CALCIUM", "PROT", "BILITOT", "ALKPHOS", "ALT", "AST", "GLUCOSE" in the last 168 hours.  Invalid input(s): "LABALBU"    Radiology: cardiac cath EKG: Normal sinus rhythm nonspecific ST-T wave changes  FOLLOW UP PLANS AND APPOINTMENTS Discharge Instructions     AMB Referral to Cardiac Rehabilitation - Phase II   Complete by: As directed    Diagnosis: Stable Angina   After initial evaluation and assessments completed: Virtual Based Care may be provided alone or in conjunction with Phase 2 Cardiac Rehab based on patient barriers.: Yes   Intensive Cardiac Rehabilitation (ICR) [facility] location only OR Traditional Cardiac Rehabilitation (TCR) *If criteria for ICR are not met will enroll in TCR [facility] only): Yes      Allergies as of 12/27/2022       Reactions   Lovastatin Other (See Comments)   Other reaction(s): Muscle Pain With higher doses        Medication List     STOP taking these medications    Breztri Aerosphere 160-9-4.8 MCG/ACT Aero Generic drug: Budeson-Glycopyrrol-Formoterol   clotrimazole-betamethasone cream Commonly known as: LOTRISONE   Co-Enzyme Q10 200 MG Caps   glucosamine-chondroitin 500-400 MG tablet   lovastatin 20 MG tablet Commonly known as: MEVACOR   NEURIVA PO   umeclidinium-vilanterol 62.5-25 MCG/INH Aepb Commonly known as: ANORO ELLIPTA       TAKE these medications    albuterol 108 (90 Base) MCG/ACT inhaler Commonly known as: VENTOLIN HFA Inhale into the lungs every 6 (six) hours as needed for wheezing or shortness of breath.   alfuzosin 10 MG 24 hr tablet Commonly known as: UROXATRAL Take 1 tablet (10 mg total) by mouth daily  with breakfast.   aspirin EC 81 MG tablet Take 81 mg by mouth daily.   citalopram 10 MG tablet Commonly known as: CELEXA Take 1 tablet by mouth daily.   clopidogrel 75 MG tablet Commonly known as:  PLAVIX Take 1 tablet (75 mg total) by mouth daily.   dorzolamide-timolol 2-0.5 % ophthalmic solution Commonly known as: COSOPT 1 drop 2 (two) times daily.   heparin 16109 UT/250ML infusion Inject 1,050 Units/hr into the vein continuous.   isosorbide mononitrate 30 MG 24 hr tablet Commonly known as: IMDUR Take 1 tablet (30 mg total) by mouth daily.   latanoprost 0.005 % ophthalmic solution Commonly known as: XALATAN 1 drop at bedtime.   lisinopril 2.5 MG tablet Commonly known as: ZESTRIL Take 1 tablet (2.5 mg total) by mouth daily.   metFORMIN 500 MG tablet Commonly known as: GLUCOPHAGE Take 1 tablet by mouth daily with breakfast.   nitroGLYCERIN 0.4 MG SL tablet Commonly known as: NITROSTAT Place 0.4 mg under the tongue every 5 (five) minutes as needed for chest pain.   ranolazine 1000 MG SR tablet Commonly known as: RANEXA Take 1 tablet (1,000 mg total) by mouth 2 (two) times daily.   rosuvastatin 40 MG tablet Commonly known as: CRESTOR Take 1 tablet (40 mg total) by mouth daily.         BRING ALL MEDICATIONS WITH YOU TO FOLLOW UP APPOINTMENTS  Time spent with patient to include physician time: 25 Signed:  Alwyn Pea MD 01/02/2023, 4:34 PM

## 2023-01-03 DIAGNOSIS — R9430 Abnormal result of cardiovascular function study, unspecified: Secondary | ICD-10-CM

## 2023-01-26 ENCOUNTER — Other Ambulatory Visit: Payer: Self-pay | Admitting: Internal Medicine

## 2023-01-26 DIAGNOSIS — R1310 Dysphagia, unspecified: Secondary | ICD-10-CM

## 2023-01-31 ENCOUNTER — Other Ambulatory Visit: Payer: Self-pay | Admitting: Internal Medicine

## 2023-01-31 ENCOUNTER — Ambulatory Visit
Admission: RE | Admit: 2023-01-31 | Discharge: 2023-01-31 | Disposition: A | Discharge status: Home / Self Care | Payer: Medicare Other | Source: Ambulatory Visit | Attending: Internal Medicine | Admitting: Internal Medicine

## 2023-01-31 DIAGNOSIS — R1310 Dysphagia, unspecified: Secondary | ICD-10-CM

## 2023-03-14 ENCOUNTER — Ambulatory Visit: Payer: BLUE CROSS/BLUE SHIELD

## 2023-05-18 ENCOUNTER — Emergency Department: Payer: Medicare Other

## 2023-05-18 ENCOUNTER — Other Ambulatory Visit: Payer: Self-pay

## 2023-05-18 ENCOUNTER — Emergency Department
Admission: EM | Admit: 2023-05-18 | Discharge: 2023-05-18 | Disposition: A | Discharge status: Home / Self Care | Payer: Medicare Other | Attending: Emergency Medicine | Admitting: Emergency Medicine

## 2023-05-18 DIAGNOSIS — J449 Chronic obstructive pulmonary disease, unspecified: Secondary | ICD-10-CM | POA: Diagnosis not present

## 2023-05-18 DIAGNOSIS — E119 Type 2 diabetes mellitus without complications: Secondary | ICD-10-CM | POA: Diagnosis not present

## 2023-05-18 DIAGNOSIS — Z951 Presence of aortocoronary bypass graft: Secondary | ICD-10-CM | POA: Diagnosis not present

## 2023-05-18 DIAGNOSIS — I2510 Atherosclerotic heart disease of native coronary artery without angina pectoris: Secondary | ICD-10-CM | POA: Insufficient documentation

## 2023-05-18 DIAGNOSIS — R0602 Shortness of breath: Secondary | ICD-10-CM | POA: Diagnosis not present

## 2023-05-18 DIAGNOSIS — R079 Chest pain, unspecified: Secondary | ICD-10-CM

## 2023-05-18 DIAGNOSIS — R0789 Other chest pain: Secondary | ICD-10-CM | POA: Insufficient documentation

## 2023-05-18 LAB — CBC
HCT: 38.3 % — ABNORMAL LOW (ref 39.0–52.0)
Hemoglobin: 13.1 g/dL (ref 13.0–17.0)
MCH: 31.3 pg (ref 26.0–34.0)
MCHC: 34.2 g/dL (ref 30.0–36.0)
MCV: 91.6 fL (ref 80.0–100.0)
Platelets: 132 10*3/uL — ABNORMAL LOW (ref 150–400)
RBC: 4.18 MIL/uL — ABNORMAL LOW (ref 4.22–5.81)
RDW: 14.5 % (ref 11.5–15.5)
WBC: 5.4 10*3/uL (ref 4.0–10.5)
nRBC: 0 % (ref 0.0–0.2)

## 2023-05-18 LAB — PROTIME-INR
INR: 1 (ref 0.8–1.2)
Prothrombin Time: 13.7 s (ref 11.4–15.2)

## 2023-05-18 LAB — TROPONIN I (HIGH SENSITIVITY)
Troponin I (High Sensitivity): 10 ng/L (ref <18)
Troponin I (High Sensitivity): 10 ng/L (ref <18)

## 2023-05-18 LAB — BASIC METABOLIC PANEL
Anion gap: 9 (ref 5–15)
BUN: 15 mg/dL (ref 8–23)
CO2: 28 mmol/L (ref 22–32)
Calcium: 9 mg/dL (ref 8.9–10.3)
Chloride: 99 mmol/L (ref 98–111)
Creatinine, Ser: 0.88 mg/dL (ref 0.61–1.24)
GFR, Estimated: >60 mL/min (ref >60)
Glucose, Bld: 144 mg/dL — ABNORMAL HIGH (ref 70–99)
Potassium: 4.9 mmol/L (ref 3.5–5.1)
Sodium: 136 mmol/L (ref 135–145)

## 2023-05-18 LAB — APTT: aPTT: 29 s (ref 24–36)

## 2023-05-18 NOTE — ED Triage Notes (Signed)
Pt comes with c/o sob and cp. Pt states he was [place] the mall walking and became sob. Pt did have recent cabg in September.   Pt states now he feels fine and has no symptoms.

## 2023-05-18 NOTE — ED Provider Notes (Signed)
[facility] Provider Note    Event Date/Time   First MD Initiated Contact with Patient 05/18/23 [phone]     (approximate)   History   Chest Pain and Shortness of Breath   HPI  Kirk Russell is a 78 y.o. 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  He reports this was around 1130, has felt "fine "since noon.  No shortness of breath, no chest tightness at this time.  No palpitations.  No fevers chills or cough     Physical Exam   Triage Vital Signs: ED Triage Vitals  Encounter Vitals Group     BP 05/18/23 1332 121/81     Systolic BP Percentile --      Diastolic BP Percentile --      Pulse Rate 05/18/23 1332 78     Resp 05/18/23 1332 18     Temp 05/18/23 1332 98 F (36.7 C)     Temp src --      SpO2 05/18/23 1332 99 %     Weight --      Height --      Head Circumference --      Peak Flow --      Pain Score 05/18/23 1330 0     Pain Loc --      Pain Education --      Exclude from Growth Chart --     Most recent vital signs: Vitals:   05/18/23 1332  BP: 121/81  Pulse: 78  Resp: 18  Temp: 98 F (36.7 C)  SpO2: 99%     General: Awake, no distress.  CV:  Good peripheral perfusion.  Regular rate and rhythm Resp:  Normal effort.  Clear to auscultation bilaterally Abd:  No distention.  Other:  No calf pain or swelling   ED Results / Procedures / Treatments   Labs (all labs ordered are listed, but only abnormal results are displayed) Labs Reviewed  BASIC METABOLIC PANEL - Abnormal; Notable for the following components:      Result Value   Glucose, Bld 144 (*)    All other components within normal limits  CBC - Abnormal; Notable for the following components:   RBC 4.18 (*)    HCT 38.3 (*)    Platelets 132 (*)    All other components within normal limits  APTT   PROTIME-INR  TROPONIN I (HIGH SENSITIVITY)  TROPONIN I (HIGH SENSITIVITY)     EKG  ED ECG REPORT I, Jene Every, the attending physician, personally viewed and interpreted this ECG.  Date: 05/18/2023  Rhythm: normal sinus rhythm QRS Axis: normal Intervals: normal ST/T Wave abnormalities: normal Narrative Interpretation: no evidence of acute ischemia    RADIOLOGY Chest x-ray viewed interpret by me, no acute abnormality    PROCEDURES:  Critical Care performed:   Procedures   MEDICATIONS ORDERED IN ED: Medications - No data to display   IMPRESSION / MDM / ASSESSMENT AND PLAN / ED COURSE  I reviewed the triage vital signs and the nursing notes. Patient's presentation is most consistent with acute presentation with potential threat to life or bodily function.  Patient with significant past medical history as detailed below presents with chest tightness and shortness of breath, differential includes ACS, overexertion, angina, pulmonary edema  Has been asymptomatic for the last 4 hours,  quite well-appearing here with no complaints, vital signs are normal  Chest x-ray is clear, no signs of pulmonary edema or pneumonia  Delta troponin normal, labs otherwise unremarkable, EKG not changed from prior.  Given the patient is asymptomatic x 4 hours, very reassuring workup, appropriate for discharge with close follow-up with cardiology, no indication for admission at this time.  Patient agrees with this plan.       FINAL CLINICAL IMPRESSION(S) / ED DIAGNOSES   Final diagnoses:  Chest pain, unspecified type     Rx / DC Orders   ED Discharge Orders     None        Note:  This document was prepared using Dragon voice recognition software and may include unintentional dictation errors.   Jene Every, MD 05/18/23 [phone]

## 2023-05-18 NOTE — ED Provider Triage Note (Signed)
Emergency Medicine Provider Triage Evaluation Note  Kirk Russell , a 78 y.o. male  was evaluated in triage.  Pt complains of SOB. He [place] the mall when it began. He had a CABG in September 2024. He denies being on a blood thinner, states he used to take plavix but not taking it now. States he feels fine at the moment, he is asymptomatic.  Review of Systems  Positive: SOB Negative: CP  Physical Exam  There were no vitals taken for this visit. Gen:   Awake, no distress   Resp:  Normal effort  MSK:   Moves extremities without difficulty  Other:    Medical Decision Making  Medically screening exam initiated at 1:26 PM.  Appropriate orders placed.  Kirk Russell was informed that the remainder of the evaluation will be completed by another provider, this initial triage assessment does not replace that evaluation, and the importance of remaining in the ED until their evaluation is complete.     Cameron Ali, PA-C 05/18/23 1328

## 2023-07-05 ENCOUNTER — Encounter: Payer: Self-pay | Admitting: *Deleted

## 2023-07-05 ENCOUNTER — Encounter: Attending: Internal Medicine | Admitting: *Deleted

## 2023-07-05 DIAGNOSIS — Z951 Presence of aortocoronary bypass graft: Secondary | ICD-10-CM | POA: Insufficient documentation

## 2023-07-05 DIAGNOSIS — Z48812 Encounter for surgical aftercare following surgery on the circulatory system: Secondary | ICD-10-CM | POA: Insufficient documentation

## 2023-07-05 NOTE — Progress Notes (Signed)
 Virtual orientation call completed today. he has an appointment on Date: 07/11/2023  for EP eval and gym Orientation.  Documentation of diagnosis can be found in [facility] 06/27/2023 .

## 2023-07-11 ENCOUNTER — Encounter

## 2023-07-11 VITALS — Ht 68.9 in | Wt 202.8 lb

## 2023-07-11 DIAGNOSIS — Z48812 Encounter for surgical aftercare following surgery on the circulatory system: Secondary | ICD-10-CM | POA: Diagnosis not present

## 2023-07-11 DIAGNOSIS — Z951 Presence of aortocoronary bypass graft: Secondary | ICD-10-CM

## 2023-07-11 NOTE — Progress Notes (Signed)
 Cardiac Individual Treatment Plan  Patient Details  Name: Kirk Russell MRN: [id number] Date of Birth: 1945-06-28 Referring Provider:   Flowsheet Row Cardiac Rehab from 07/11/2023 in [facility] Cardiac and Pulmonary Rehab  Referring Provider Dorothyann Peng, MD       Initial Encounter Date:  Flowsheet Row Cardiac Rehab from 07/11/2023 in [facility] Cardiac and Pulmonary Rehab  Date 07/11/23       Visit Diagnosis: S/P CABG x 3  Patient's Home Medications on Admission:  Current Outpatient Medications:    albuterol (VENTOLIN HFA) 108 (90 Base) MCG/ACT inhaler, Inhale into the lungs every 6 (six) hours as needed for wheezing or shortness of breath., Disp: , Rfl:    alfuzosin (UROXATRAL) 10 MG 24 hr tablet, Take 1 tablet (10 mg total) by mouth daily with breakfast., Disp: 90 tablet, Rfl: 3   aspirin EC 81 MG tablet, Take 81 mg by mouth daily., Disp: , Rfl:    citalopram (CELEXA) 10 MG tablet, Take 1 tablet by mouth daily., Disp: , Rfl:    clopidogrel (PLAVIX) 75 MG tablet, Take 1 tablet (75 mg total) by mouth daily. (Patient not taking: Reported on 07/05/2023), Disp: 30 tablet, Rfl: 1   dorzolamide-timolol (COSOPT) 22.3-6.8 MG/ML ophthalmic solution, 1 drop 2 (two) times daily., Disp: , Rfl:    heparin 01027 UT/250ML infusion, Inject 1,050 Units/hr into the vein continuous., Disp: 1000 mL, Rfl: 0   isosorbide mononitrate (IMDUR) 30 MG 24 hr tablet, Take 1 tablet (30 mg total) by mouth daily., Disp: 30 tablet, Rfl: 1   latanoprost (XALATAN) 0.005 % ophthalmic solution, 1 drop at bedtime., Disp: , Rfl:    lisinopril (ZESTRIL) 2.5 MG tablet, Take 1 tablet (2.5 mg total) by mouth daily. (Patient not taking: Reported on 07/05/2023), Disp: 30 tablet, Rfl: 1   metFORMIN (GLUCOPHAGE) 500 MG tablet, Take 1 tablet by mouth daily with breakfast. (Patient not taking: Reported on 07/05/2023), Disp: , Rfl:    nitroGLYCERIN (NITROSTAT) 0.4 MG SL tablet, Place 0.4 mg under the tongue every 5 (five) minutes as  needed for chest pain., Disp: , Rfl:    ranolazine (RANEXA) 1000 MG SR tablet, Take 1 tablet (1,000 mg total) by mouth 2 (two) times daily. 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 ears     Tobacco Use: Social History   Tobacco Use  Smoking Status Former   Current packs/day: 0.00   Types: Cigarettes   Quit date: 1990   Years since quitting: 35.2  Smokeless Tobacco Former   Quit date: 04/24/1988    Labs: Review Flowsheet       Latest Ref Rng & Units 12/14/2022 12/24/2022  Labs for ITP Cardiac and Pulmonary Rehab  Cholestrol  0 - 200 mg/dL - 742   LDL (calc) 0 - 99 mg/dL - 75   HDL-C >59 mg/dL - 51   Trlycerides <563 mg/dL - 87   Hemoglobin O7F 4.8 - 5.6 % 6.1  -     Exercise Target Goals: Exercise Program Goal: Individual exercise prescription set using results from initial 6 min walk test and THRR while considering  patient's activity barriers and safety.   Exercise Prescription Goal: Initial exercise prescription builds to 30-45 minutes a day of aerobic activity, 2-3 days per week.  Home exercise guidelines will be given to patient during program as part of exercise prescription that the participant will acknowledge.   Education: Aerobic Exercise: - Group verbal and visual presentation on the components of exercise  prescription. Introduces F.I.T.T principle from ACSM for exercise prescriptions.  Reviews F.I.T.T. principles of aerobic exercise including progression. Written material given at graduation. Flowsheet Row Cardiac Rehab from 07/11/2023 in [facility] Cardiac and Pulmonary Rehab  Education need identified 07/11/23       Education: Resistance Exercise: - Group verbal and visual presentation on the components of exercise prescription. Introduces F.I.T.T principle from ACSM for exercise prescriptions  Reviews F.I.T.T. principles of resistance exercise including progression. Written material given at graduation.    Education: Exercise & Equipment Safety: - Individual verbal instruction and demonstration of equipment use and safety with use of the equipment. Flowsheet Row Cardiac Rehab from 07/11/2023 in [facility] Cardiac and Pulmonary Rehab  Date 07/11/23  Educator MB  Instruction Review Code 1- Verbalizes Understanding       Education: Exercise Physiology & General Exercise Guidelines: - Group verbal and written instruction with models to review the exercise physiology of the cardiovascular system and associated critical values. Provides general exercise guidelines with specific guidelines to those with heart or lung disease.    Education: Flexibility, Balance, Mind/Body Relaxation: - Group verbal and visual presentation with interactive activity on the components of exercise prescription. Introduces F.I.T.T principle from ACSM for exercise prescriptions. Reviews F.I.T.T. principles of flexibility and balance exercise training including progression. Also discusses the mind body connection.  Reviews various relaxation techniques to help reduce and manage stress (i.e. Deep breathing, progressive muscle relaxation, and visualization). Balance handout provided to take home. Written material given at graduation.   Activity Barriers & Risk Stratification:  Activity Barriers & Cardiac Risk Stratification -  07/11/23 1142       Activity Barriers & Cardiac Risk Stratification   Activity Barriers Shortness of Breath;Other (comment)    Comments Neuropathy    Cardiac Risk Stratification High             6 Minute Walk:  6 Minute Walk     Row Name 07/11/23 1141         6 Minute Walk   Phase Initial     Distance 1135 feet     Walk Time 6 minutes     # of Rest Breaks 0     MPH 2.15     METS 2.04     RPE 9     Perceived Dyspnea  0     VO2 Peak 7.14     Symptoms No     Resting HR 67 bpm     Resting BP 110/70     Resting Oxygen Saturation  98 %     Exercise Oxygen Saturation  during 6 min walk 96 %     Max Ex. HR 94 bpm     Max Ex. BP  130/70     2 Minute Post BP 110/72              Oxygen Initial Assessment:   Oxygen Re-Evaluation:   Oxygen Discharge (Final Oxygen Re-Evaluation):   Initial Exercise Prescription:  Initial Exercise Prescription - 07/11/23 1100       Date of Initial Exercise RX and Referring Provider   Date 07/11/23    Referring Provider Dorothyann Peng, MD      Oxygen   Maintain Oxygen Saturation 88% or higher      Treadmill   MPH 1.8    Grade 0    Minutes 15    METs 2.38      Recumbant Bike   Level 2    RPM 50    Watts 15    Minutes 15    METs 2.04      NuStep   Level 2    SPM 80    Minutes 15    METs 2.04      REL-XR   Level 1    Speed 50    Minutes 15    METs 2.04      T5 Nustep   Level 2   T6   SPM 80    Minutes 15    METs 2.04      Prescription Details   Frequency (times per week) 3    Duration Progress to 30 minutes of continuous aerobic without signs/symptoms of physical distress      Intensity   THRR 40-80% of Max Heartrate 97-127    Ratings of Perceived Exertion 11-13    Perceived Dyspnea 0-4      Progression   Progression Continue to progress workloads to maintain intensity without signs/symptoms of physical distress.      Resistance Training   Training Prescription Yes    Weight 7 lb    Reps  10-15             Perform Capillary Blood Glucose checks as needed.  Exercise Prescription Changes:   Exercise Prescription Changes     Row Name 07/11/23 1100             Response to Exercise   Blood Pressure (Admit) 110/70       Blood Pressure (Exercise) 130/70       Blood Pressure (Exit) 110/72       Heart Rate (Admit) 67 bpm       Heart Rate (Exercise) 94 bpm       Heart Rate (Exit) 67 bpm       Oxygen Saturation (Admit) 98 %       Oxygen Saturation (Exercise) 96 %       Oxygen Saturation (Exit) 98 %       Rating of Perceived Exertion (Exercise) 9       Perceived Dyspnea (Exercise) 0       Symptoms none       Comments results       Duration Progress to 30 minutes of  aerobic without signs/symptoms of physical distress       Intensity THRR New         Progression   Progression Continue to progress workloads to maintain intensity without signs/symptoms of physical distress.       Average METs 2.04                Exercise Comments:   Exercise Goals and Review:   Exercise Goals  Row Name 07/11/23 1147             Exercise Goals   Increase Physical Activity Yes       Intervention Provide advice, education, support and counseling about physical activity/exercise needs.;Develop an individualized exercise prescription for aerobic and resistive training based on initial evaluation findings, risk stratification, comorbidities and participant's personal goals.       Expected Outcomes Short Term: Attend rehab on a regular basis to increase amount of physical activity.;Long Term: Add in home exercise to make exercise part of routine and to increase amount of physical activity.;Long Term: Exercising regularly at least 3-5 days a week.       Increase Strength and Stamina Yes       Intervention Provide advice, education, support and counseling about physical activity/exercise needs.;Develop an individualized exercise prescription for aerobic and resistive  training based on initial evaluation findings, risk stratification, comorbidities and participant's personal goals.       Expected Outcomes Short Term: Increase workloads from initial exercise prescription for resistance, speed, and METs.;Short Term: Perform resistance training exercises routinely during rehab and add in resistance training at home;Long Term: Improve cardiorespiratory fitness, muscular endurance and strength as measured by increased METs and functional capacity ( )       Able to understand and use rate of perceived exertion (RPE) scale Yes       Intervention Provide education and explanation on how to use RPE scale       Expected Outcomes Short Term: Able to use RPE daily in rehab to express subjective intensity level;Long Term:  Able to use RPE to guide intensity level when exercising independently       Able to understand and use Dyspnea scale Yes       Intervention Provide education and explanation on how to use Dyspnea scale       Expected Outcomes Short Term: Able to use Dyspnea scale daily in rehab to express subjective sense of shortness of breath during exertion;Long Term: Able to use Dyspnea scale to guide intensity level when exercising independently       Knowledge and understanding of Target Heart Rate Range (THRR) Yes       Intervention Provide education and explanation of THRR including how the numbers were predicted and where they are located for reference       Expected Outcomes Short Term: Able to state/look up THRR;Short Term: Able to use daily as guideline for intensity in rehab;Long Term: Able to use THRR to govern intensity when exercising independently       Able to check pulse independently Yes       Intervention Provide education and demonstration on how to check pulse in carotid and radial arteries.;Review the importance of being able to check your own pulse for safety during independent exercise       Expected Outcomes Short Term: Able to explain why pulse  checking is important during independent exercise;Long Term: Able to check pulse independently and accurately       Understanding of Exercise Prescription Yes       Intervention Provide education, explanation, and written materials on patient's individual exercise prescription       Expected Outcomes Short Term: Able to explain program exercise prescription;Long Term: Able to explain home exercise prescription to exercise independently                Exercise Goals Re-Evaluation :   Discharge Exercise Prescription (Final Exercise Prescription Changes):  Exercise Prescription Changes -  07/11/23 1100       Response to Exercise   Blood Pressure (Admit) 110/70    Blood Pressure (Exercise) 130/70    Blood Pressure (Exit) 110/72    Heart Rate (Admit) 67 bpm    Heart Rate (Exercise) 94 bpm    Heart Rate (Exit) 67 bpm    Oxygen Saturation (Admit) 98 %    Oxygen Saturation (Exercise) 96 %    Oxygen Saturation (Exit) 98 %    Rating of Perceived Exertion (Exercise) 9    Perceived Dyspnea (Exercise) 0    Symptoms none    Comments results    Duration Progress to 30 minutes of  aerobic without signs/symptoms of physical distress    Intensity THRR New      Progression   Progression Continue to progress workloads to maintain intensity without signs/symptoms of physical distress.    Average METs 2.04             Nutrition:  Target Goals: Understanding of nutrition guidelines, daily intake of sodium 1500mg , cholesterol 200mg , calories 30% from fat and 7% or less from saturated fats, daily to have 5 or more servings of fruits and vegetables.  Education: All About Nutrition: -Group instruction provided by verbal, written material, interactive activities, discussions, models, and posters to present general guidelines for heart healthy nutrition including fat, fiber, MyPlate, the role of sodium in heart healthy nutrition, utilization of the nutrition label, and utilization of this  knowledge for meal planning. Follow up email sent as well. Written material given at graduation. Flowsheet Row Cardiac Rehab from 07/11/2023 in [facility] Cardiac and Pulmonary Rehab  Education need identified 07/11/23       Biometrics:  Pre Biometrics - 07/11/23 1149       Pre Biometrics   Height 5' 8.9" (1.75 m)    Weight 202 lb 12.8 oz (92 kg)    Waist Circumference 43 inches    Hip Circumference 44.5 inches    Waist to Hip Ratio 0.97 %    BMI (Calculated) 30.04    Single Leg Stand 3 seconds              Nutrition Therapy Plan and Nutrition Goals:  Nutrition Therapy & Goals - 07/11/23 1153       Nutrition Therapy   RD appointment deferred Yes      Personal Nutrition Goals   Nutrition Goal RD appointment deferred at this time      Intervention Plan   Intervention Prescribe, educate and counsel regarding individualized specific dietary modifications aiming towards targeted core components such as weight, hypertension, lipid management, diabetes, heart failure and other comorbidities.    Expected Outcomes Short Term Goal: Understand basic principles of dietary content, such as calories, fat, sodium, cholesterol and nutrients.             Nutrition Assessments:  MEDIFICTS Score Key: >=70 Need to make dietary changes  40-70 Heart Healthy Diet <= 40 Therapeutic Level Cholesterol Diet  Flowsheet Row Cardiac Rehab from 07/11/2023 in [facility] Cardiac and Pulmonary Rehab  Picture Your Plate Total Score on Admission 36      Picture Your Plate Scores: <16 Unhealthy dietary pattern with much room for improvement. 41-50 Dietary pattern unlikely to meet recommendations for good health and room for improvement. 51-60 More healthful dietary pattern, with some room for improvement.  >60 Healthy dietary pattern, although there may be some specific behaviors that could be improved.    Nutrition Goals Re-Evaluation:   Nutrition  Goals Discharge (Final Nutrition Goals  Re-Evaluation):   Psychosocial: Target Goals: Acknowledge presence or absence of significant depression and/or stress, maximize coping skills, provide positive support system. Participant is able to verbalize types and ability to use techniques and skills needed for reducing stress and depression.   Education: Stress, Anxiety, and Depression - Group verbal and visual presentation to define topics covered.  Reviews how body is impacted by stress, anxiety, and depression.  Also discusses healthy ways to reduce stress and to treat/manage anxiety and depression.  Written material given at graduation.   Education: Sleep Hygiene -Provides group verbal and written instruction about how sleep can affect your health.  Define sleep hygiene, discuss sleep cycles and impact of sleep habits. Review good sleep hygiene tips.    Initial Review & Psychosocial Screening:  Initial Psych Review & Screening - 07/05/23 1341       Initial Review   Current issues with Current Stress Concerns;Current Anxiety/Panic    Source of Stress Concerns Unable to perform yard/household activities    Comments Aniety over managing a large estate for sister,   cant get out golfing yet.      Family Dynamics   Good Support System? Yes   wife     Barriers   Psychosocial barriers to participate in program There are no identifiable barriers or psychosocial needs.      Screening Interventions   Interventions To provide support and resources with identified psychosocial needs;Provide feedback about the scores to participant;Encouraged to exercise    Expected Outcomes Short Term goal: Utilizing psychosocial counselor, staff and physician to assist with identification of specific Stressors or current issues interfering with healing process. Setting desired goal for each stressor or current issue identified.;Long Term Goal: Stressors or current issues are controlled or eliminated.;Short Term goal: Identification and review with  participant of any Quality of Life or Depression concerns found by scoring the questionnaire.;Long Term goal: The participant improves quality of Life and PHQ9 Scores as seen by post scores and/or verbalization of changes             Quality of Life Scores:   Quality of Life - 07/11/23 1150       Quality of Life   Select Quality of Life      Quality of Life Scores   Health/Function Pre 17.43 %    Socioeconomic Pre 23.83 %    Psych/Spiritual Pre 22.71 %    Family Pre 24.6 %    GLOBAL Pre 20.8 %            Scores of 19 and below usually indicate a poorer quality of life in these areas.  A difference of  2-3 points is a clinically meaningful difference.  A difference of 2-3 points in the total score of the Quality of Life Index has been associated with significant improvement in overall quality of life, self-image, physical symptoms, and general health in studies assessing change in quality of life.  PHQ-9: Review Flowsheet       07/11/2023  Depression screen PHQ 2/9  Decreased Interest 1  Down, Depressed, Hopeless 0  PHQ - 2 Score 1  Altered sleeping 0  Tired, decreased energy 1  Change in appetite 0  Feeling bad or failure about yourself  0  Trouble concentrating 2  Moving slowly or fidgety/restless 0  Suicidal thoughts 0  PHQ-9 Score 4  Difficult doing work/chores Not difficult at all   Interpretation of Total Score  Total Score Depression Severity:  1-4 = Minimal depression, 5-9 = Mild depression, 10-14 = Moderate depression, 15-19 = Moderately severe depression, 20-27 = Severe depression   Psychosocial Evaluation and Intervention:  Psychosocial Evaluation - 07/05/23 1357       Psychosocial Evaluation & Interventions   Interventions Encouraged to exercise with the program and follow exercise prescription    Comments There are no barriers to his starting the program.  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  He lives with his wife and she is his support.  He controls his diabetes with diet and exercise.  He would like to drop 15 pounds. He is rady to strt the program    Expected Outcomes STG attend all scheduled sessions, meet with the RD and work on exercise progression as advised by EP.   LTG aintain exercise progression and get back on the golf course.    Continue Psychosocial Services  Follow up required by staff             Psychosocial Re-Evaluation:   Psychosocial Discharge (Final Psychosocial Re-Evaluation):   Vocational Rehabilitation: Provide vocational rehab assistance to qualifying candidates.   Vocational Rehab Evaluation & Intervention:   Education: Education Goals: Education classes will be provided on a variety of topics geared toward better understanding of heart health and risk factor modification. Participant will state understanding/return demonstration of topics presented as noted by education test scores.  Learning Barriers/Preferences:  Learning Barriers/Preferences - 07/05/23 1346       Learning Barriers/Preferences   Learning Barriers Hearing    Learning Preferences None             General Cardiac Education Topics:  AED/CPR: - Group verbal and written instruction with the use of models to demonstrate the basic use of the AED with the basic ABC's of resuscitation.   Anatomy and Cardiac Procedures: - Group verbal and visual presentation and models provide information about basic cardiac anatomy and function. Reviews the testing methods done to diagnose heart disease and the outcomes of the test results. Describes the treatment choices: Medical Management, Angioplasty, or Coronary Bypass Surgery for treating various heart conditions including Myocardial Infarction, Angina, Valve Disease, and Cardiac Arrhythmias.  Written material given at graduation. Flowsheet Row Cardiac Rehab from 07/11/2023 in [facility] Cardiac  and Pulmonary Rehab  Education need identified 07/11/23       Medication Safety: - Group verbal and visual instruction to review commonly prescribed medications for heart and lung disease. Reviews the medication, class of the drug, and side effects. Includes the steps to properly store meds and maintain the prescription regimen.  Written material given at graduation.   Intimacy: - Group verbal instruction through game format to discuss how heart and lung disease can affect sexual intimacy. Written material given at graduation..   Know Your Numbers and Heart Failure: - Group verbal and visual instruction to discuss disease risk factors for cardiac and pulmonary disease and treatment options.  Reviews associated critical values for Overweight/Obesity, Hypertension, Cholesterol, and Diabetes.  Discusses basics of heart failure: signs/symptoms and treatments.  Introduces Heart Failure Zone chart for action plan for heart failure.  Written material given at graduation. Flowsheet Row Cardiac Rehab from 07/11/2023 in [facility] Cardiac and Pulmonary Rehab  Education need identified 07/11/23       Infection Prevention: - Provides verbal and written material to individual with discussion of infection control including  proper hand washing and proper equipment cleaning during exercise session. Flowsheet Row Cardiac Rehab from 07/11/2023 in [facility] Cardiac and Pulmonary Rehab  Date 07/11/23  Educator MB  Instruction Review Code 1- Verbalizes Understanding       Falls Prevention: - Provides verbal and written material to individual with discussion of falls prevention and safety. Flowsheet Row Cardiac Rehab from 07/11/2023 in [facility] Cardiac and Pulmonary Rehab  Date 07/11/23  Educator MB  Instruction Review Code 1- Verbalizes Understanding       Other: -Provides group and verbal instruction on various topics (see comments)   Knowledge Questionnaire Score:  Knowledge Questionnaire Score - 07/11/23  1154       Knowledge Questionnaire Score   Pre Score 21/26             Core Components/Risk Factors/Patient Goals at Admission:  Personal Goals and Risk Factors at Admission - 07/11/23 1154       Core Components/Risk Factors/Patient Goals on Admission    Weight Management Yes    Intervention Weight Management: Develop a combined nutrition and exercise program designed to reach desired caloric intake, while maintaining appropriate intake of nutrient and fiber, sodium and fats, and appropriate energy expenditure required for the weight goal.    Admit Weight 202 lb 12.8 oz (92 kg)    Goal Weight: Short Term 198 lb (89.8 kg)    Goal Weight: Long Term 185 lb (83.9 kg)    Expected Outcomes Short Term: Continue to assess and modify interventions until short term weight is achieved;Long Term: Adherence to nutrition and physical activity/exercise program aimed toward attainment of established weight goal;Weight Loss: Understanding of general recommendations for a balanced deficit meal plan, which promotes 1-2 lb weight loss per week and includes a negative energy balance of [phone] kcal/d;Understanding recommendations for meals to include 15-35% energy as protein, 25-35% energy from fat, 35-60% energy from carbohydrates, less than 200mg  of dietary cholesterol, 20-35 gm of total fiber daily;Understanding of distribution of calorie intake throughout the day with the consumption of 4-5 meals/snacks    Improve shortness of breath with ADL's Yes    Intervention Provide education, individualized exercise plan and daily activity instruction to help decrease symptoms of SOB with activities of daily living.    Expected Outcomes Short Term: Improve cardiorespiratory fitness to achieve a reduction of symptoms when performing ADLs;Long Term: Be able to perform more ADLs without symptoms or delay the onset of symptoms    Diabetes Yes   Diet-controlled   Intervention Provide education about signs/symptoms and  action to take for hypo/hyperglycemia.;Provide education about proper nutrition, including hydration, and aerobic/resistive exercise prescription along with prescribed medications to achieve blood glucose in normal ranges: Fasting glucose 65-99 mg/dL    Expected Outcomes Short Term: Participant verbalizes understanding of the signs/symptoms and immediate care of hyper/hypoglycemia, proper foot care and importance of medication, aerobic/resistive exercise and nutrition plan for blood glucose control.;Long Term: Attainment of HbA1C < 7%.    Hypertension Yes    Intervention Provide education on lifestyle modifcations including regular physical activity/exercise, weight management, moderate sodium restriction and increased consumption of fresh fruit, vegetables, and low fat dairy, alcohol moderation, and smoking cessation.;Monitor prescription use compliance.    Expected Outcomes Short Term: Continued assessment and intervention until BP is < 140/74mm HG in hypertensive participants. < 130/54mm HG in hypertensive participants with diabetes, heart failure or chronic kidney disease.;Long Term: Maintenance of blood pressure at goal levels.    Lipids Yes    Intervention Provide  education and support for participant on nutrition & aerobic/resistive exercise along with prescribed medications to achieve LDL 70mg , HDL >40mg .    Expected Outcomes Short Term: Participant states understanding of desired cholesterol values and is compliant with medications prescribed. Participant is following exercise prescription and nutrition guidelines.;Long Term: Cholesterol controlled with medications as prescribed, with individualized exercise RX and with personalized nutrition plan. Value goals: LDL < 70mg , HDL > 40 mg.             Education:Diabetes - Individual verbal and written instruction to review signs/symptoms of diabetes, desired ranges of glucose level fasting, after meals and with exercise. Acknowledge that pre  and post exercise glucose checks will be done for 3 sessions at entry of program. Flowsheet Row Cardiac Rehab from 07/11/2023 in [facility] Cardiac and Pulmonary Rehab  Date 07/11/23  Educator MB  Instruction Review Code 1- Verbalizes Understanding       Core Components/Risk Factors/Patient Goals Review:    Core Components/Risk Factors/Patient Goals at Discharge (Final Review):    ITP Comments:  ITP Comments     Row Name 07/05/23 1357 07/11/23 1141         ITP Comments Virtual orientation call completed today. he has an appointment on Date: 07/11/2023  for EP eval and gym Orientation.  Documentation of diagnosis can be found in [facility] 06/27/2023 . Completed and gym orientation. Initial ITP created and sent for review to Dr. Bethann Punches, Medical Director.               Comments: Initial ITP

## 2023-07-11 NOTE — Patient Instructions (Addendum)
 Patient Instructions  Patient Details  Name: Kirk Russell MRN: [id number] Date of Birth: Jul 03, 1945 Referring Provider:  Alwyn Pea, MD  Below are your personal goals for exercise, nutrition, and risk factors. Our goal is to help you stay on track towards obtaining and maintaining these goals. We will be discussing your progress on these goals with you throughout the program.  Initial Exercise Prescription:  Initial Exercise Prescription - 07/11/23 1100       Date of Initial Exercise RX and Referring Provider   Date 07/11/23    Referring Provider Dorothyann Peng, MD      Oxygen   Maintain Oxygen Saturation 88% or higher      Treadmill   MPH 1.8    Grade 0    Minutes 15    METs 2.38      Recumbant Bike   Level 2    RPM 50    Watts 15    Minutes 15    METs 2.04      NuStep   Level 2    SPM 80    Minutes 15    METs 2.04      REL-XR   Level 1    Speed 50    Minutes 15    METs 2.04      T5 Nustep   Level 2   T6   SPM 80    Minutes 15    METs 2.04      Prescription Details   Frequency (times per week) 3    Duration Progress to 30 minutes of continuous aerobic without signs/symptoms of physical distress      Intensity   THRR 40-80% of Max Heartrate 97-127    Ratings of Perceived Exertion 11-13    Perceived Dyspnea 0-4      Progression   Progression Continue to progress workloads to maintain intensity without signs/symptoms of physical distress.      Resistance Training   Training Prescription Yes    Weight 7 lb    Reps 10-15             Exercise Goals: Frequency: Be able to perform aerobic exercise two to three times per week in program working toward 2-5 days per week of home exercise.  Intensity: Work with a perceived exertion of 11 (fairly light) - 15 (hard) while following your exercise prescription.  We will make changes to your prescription with you as you progress through the program.   Duration: Be able to do 30 to 45  minutes of continuous aerobic exercise in addition to a 5 minute warm-up and a 5 minute cool-down routine.   Nutrition Goals: Your personal nutrition goals will be established when you do your nutrition analysis with the dietician.  The following are general nutrition guidelines to follow: Cholesterol < 200mg /day Sodium < 1500mg /day Fiber: Men over 50 yrs - 30 grams per day  Personal Goals:  Personal Goals and Risk Factors at Admission - 07/11/23 1154       Core Components/Risk Factors/Patient Goals on Admission    Weight Management Yes    Intervention Weight Management: Develop a combined nutrition and exercise program designed to reach desired caloric intake, while maintaining appropriate intake of nutrient and fiber, sodium and fats, and appropriate energy expenditure required for the weight goal.    Admit Weight 202 lb 12.8 oz (92 kg)    Goal Weight: Short Term 198 lb (89.8 kg)    Goal Weight: Long Term 185 lb (  83.9 kg)    Expected Outcomes Short Term: Continue to assess and modify interventions until short term weight is achieved;Long Term: Adherence to nutrition and physical activity/exercise program aimed toward attainment of established weight goal;Weight Loss: Understanding of general recommendations for a balanced deficit meal plan, which promotes 1-2 lb weight loss per week and includes a negative energy balance of [phone] kcal/d;Understanding recommendations for meals to include 15-35% energy as protein, 25-35% energy from fat, 35-60% energy from carbohydrates, less than 200mg  of dietary cholesterol, 20-35 gm of total fiber daily;Understanding of distribution of calorie intake throughout the day with the consumption of 4-5 meals/snacks    Improve shortness of breath with ADL's Yes    Intervention Provide education, individualized exercise plan and daily activity instruction to help decrease symptoms of SOB with activities of daily living.    Expected Outcomes Short Term: Improve  cardiorespiratory fitness to achieve a reduction of symptoms when performing ADLs;Long Term: Be able to perform more ADLs without symptoms or delay the onset of symptoms    Diabetes Yes   Diet-controlled   Intervention Provide education about signs/symptoms and action to take for hypo/hyperglycemia.;Provide education about proper nutrition, including hydration, and aerobic/resistive exercise prescription along with prescribed medications to achieve blood glucose in normal ranges: Fasting glucose 65-99 mg/dL    Expected Outcomes Short Term: Participant verbalizes understanding of the signs/symptoms and immediate care of hyper/hypoglycemia, proper foot care and importance of medication, aerobic/resistive exercise and nutrition plan for blood glucose control.;Long Term: Attainment of HbA1C < 7%.    Hypertension Yes    Intervention Provide education on lifestyle modifcations including regular physical activity/exercise, weight management, moderate sodium restriction and increased consumption of fresh fruit, vegetables, and low fat dairy, alcohol moderation, and smoking cessation.;Monitor prescription use compliance.    Expected Outcomes Short Term: Continued assessment and intervention until BP is < 140/100mm HG in hypertensive participants. < 130/62mm HG in hypertensive participants with diabetes, heart failure or chronic kidney disease.;Long Term: Maintenance of blood pressure at goal levels.    Lipids Yes    Intervention Provide education and support for participant on nutrition & aerobic/resistive exercise along with prescribed medications to achieve LDL 70mg , HDL >40mg .    Expected Outcomes Short Term: Participant states understanding of desired cholesterol values and is compliant with medications prescribed. Participant is following exercise prescription and nutrition guidelines.;Long Term: Cholesterol controlled with medications as prescribed, with individualized exercise RX and with personalized  nutrition plan. Value goals: LDL < 70mg , HDL > 40 mg.             Tobacco Use Initial Evaluation: Social History   Tobacco Use  Smoking Status Former   Current packs/day: 0.00   Types: Cigarettes   Quit date: 1990   Years since quitting: 35.2  Smokeless Tobacco Former   Quit date: 04/24/1988    Exercise Goals and Review:  Exercise Goals     Row Name 07/11/23 1147             Exercise Goals   Increase Physical Activity Yes       Intervention Provide advice, education, support and counseling about physical activity/exercise needs.;Develop an individualized exercise prescription for aerobic and resistive training based on initial evaluation findings, risk stratification, comorbidities and participant's personal goals.       Expected Outcomes Short Term: Attend rehab on a regular basis to increase amount of physical activity.;Long Term: Add in home exercise to make exercise part of routine and to increase amount of physical  activity.;Long Term: Exercising regularly at least 3-5 days a week.       Increase Strength and Stamina Yes       Intervention Provide advice, education, support and counseling about physical activity/exercise needs.;Develop an individualized exercise prescription for aerobic and resistive training based on initial evaluation findings, risk stratification, comorbidities and participant's personal goals.       Expected Outcomes Short Term: Increase workloads from initial exercise prescription for resistance, speed, and METs.;Short Term: Perform resistance training exercises routinely during rehab and add in resistance training at home;Long Term: Improve cardiorespiratory fitness, muscular endurance and strength as measured by increased METs and functional capacity ( )       Able to understand and use rate of perceived exertion (RPE) scale Yes       Intervention Provide education and explanation on how to use RPE scale       Expected Outcomes Short Term: Able to  use RPE daily in rehab to express subjective intensity level;Long Term:  Able to use RPE to guide intensity level when exercising independently       Able to understand and use Dyspnea scale Yes       Intervention Provide education and explanation on how to use Dyspnea scale       Expected Outcomes Short Term: Able to use Dyspnea scale daily in rehab to express subjective sense of shortness of breath during exertion;Long Term: Able to use Dyspnea scale to guide intensity level when exercising independently       Knowledge and understanding of Target Heart Rate Range (THRR) Yes       Intervention Provide education and explanation of THRR including how the numbers were predicted and where they are located for reference       Expected Outcomes Short Term: Able to state/look up THRR;Short Term: Able to use daily as guideline for intensity in rehab;Long Term: Able to use THRR to govern intensity when exercising independently       Able to check pulse independently Yes       Intervention Provide education and demonstration on how to check pulse in carotid and radial arteries.;Review the importance of being able to check your own pulse for safety during independent exercise       Expected Outcomes Short Term: Able to explain why pulse checking is important during independent exercise;Long Term: Able to check pulse independently and accurately       Understanding of Exercise Prescription Yes       Intervention Provide education, explanation, and written materials on patient's individual exercise prescription       Expected Outcomes Short Term: Able to explain program exercise prescription;Long Term: Able to explain home exercise prescription to exercise independently

## 2023-07-16 ENCOUNTER — Encounter: Admitting: *Deleted

## 2023-07-16 DIAGNOSIS — Z48812 Encounter for surgical aftercare following surgery on the circulatory system: Secondary | ICD-10-CM | POA: Diagnosis not present

## 2023-07-16 DIAGNOSIS — Z951 Presence of aortocoronary bypass graft: Secondary | ICD-10-CM

## 2023-07-16 NOTE — Progress Notes (Signed)
 Daily Session Note  Patient Details  Name: Kirk Russell MRN: [id number] Date of Birth: 07-21-1945 Referring Provider:   Flowsheet Row Cardiac Rehab from 07/11/2023 in [facility] Cardiac and Pulmonary Rehab  Referring Provider Dorothyann Peng, MD       Encounter Date: 07/16/2023  Check In:  Session Check In - 07/16/23 0953       Check-In   Supervising physician immediately available to respond to emergencies See telemetry face sheet for immediately available ER MD    Location [facility]-Cardiac & Pulmonary Rehab    Staff Present Rory Percy, MS, Exercise Physiologist;Kelly Madilyn Fireman BS, ACSM CEP, Exercise Physiologist;Maxon Conetta BS, Exercise Physiologist;Chaunda Vandergriff, RN, BSN, CCRP    Virtual Visit No    Medication changes reported     No    Fall or balance concerns reported    No    Warm-up and Cool-down Performed on first and last piece of equipment    Resistance Training Performed Yes    VAD Patient? No    PAD/SET Patient? No      Pain Assessment   Currently in Pain? No/denies                Social History   Tobacco Use  Smoking Status Former   Current packs/day: 0.00   Types: Cigarettes   Quit date: 1990   Years since quitting: 35.2  Smokeless Tobacco Former   Quit date: 04/24/1988    Goals Met:  Independence with exercise equipment Exercise tolerated well No report of concerns or symptoms today  Goals Unmet:  Not Applicable  Comments: Pt able to follow exercise prescription today without complaint.  Will continue to monitor for progression. First full day of exercise!  Patient was oriented to gym and equipment including functions, settings, policies, and procedures.  Patient's individual exercise prescription and treatment plan were reviewed.  All starting workloads were established based on the results of the 6 minute walk test done at initial orientation visit.  The plan for exercise progression was also introduced and progression will be customized  based on patient's performance and goals.     Dr. Bethann Punches is Medical Director for [facility] Cardiac Rehabilitation.  Dr. Vida Rigger is Medical Director for [facility] Pulmonary Rehabilitation.

## 2023-07-18 ENCOUNTER — Encounter: Admitting: *Deleted

## 2023-07-18 DIAGNOSIS — Z951 Presence of aortocoronary bypass graft: Secondary | ICD-10-CM

## 2023-07-18 DIAGNOSIS — Z48812 Encounter for surgical aftercare following surgery on the circulatory system: Secondary | ICD-10-CM | POA: Diagnosis not present

## 2023-07-18 NOTE — Progress Notes (Signed)
 Daily Session Note  Patient Details  Name: Kirk Russell MRN: [id number] Date of Birth: 1945-07-06 Referring Provider:   Flowsheet Row Cardiac Rehab from 07/11/2023 in [facility] Cardiac and Pulmonary Rehab  Referring Provider Dorothyann Peng, MD       Encounter Date: 07/18/2023  Check In:  Session Check In - 07/18/23 0954       Check-In   Supervising physician immediately available to respond to emergencies See telemetry face sheet for immediately available ER MD    Location [facility]-Cardiac & Pulmonary Rehab    Staff Present Cora Collum, RN, BSN, CCRP;Joseph Hood RCP,RRT,BSRT;Noah Tickle, [place], Exercise Physiologist;Jason Wallace Cullens RDN,LDN    Virtual Visit No    Medication changes reported     No    Fall or balance concerns reported    No    Warm-up and Cool-down Performed on first and last piece of equipment    Resistance Training Performed Yes    VAD Patient? No    PAD/SET Patient? No      Pain Assessment   Currently in Pain? No/denies                Social History   Tobacco Use  Smoking Status Former   Current packs/day: 0.00   Types: Cigarettes   Quit date: 1990   Years since quitting: 35.2  Smokeless Tobacco Former   Quit date: 04/24/1988    Goals Met:  Independence with exercise equipment Exercise tolerated well No report of concerns or symptoms today  Goals Unmet:  Not Applicable  Comments: Pt able to follow exercise prescription today without complaint.  Will continue to monitor for progression.    Dr. Bethann Punches is Medical Director for [facility] Cardiac Rehabilitation.  Dr. Vida Rigger is Medical Director for [facility] Pulmonary Rehabilitation.

## 2023-07-20 ENCOUNTER — Encounter

## 2023-07-20 DIAGNOSIS — Z48812 Encounter for surgical aftercare following surgery on the circulatory system: Secondary | ICD-10-CM | POA: Diagnosis not present

## 2023-07-20 DIAGNOSIS — Z951 Presence of aortocoronary bypass graft: Secondary | ICD-10-CM

## 2023-07-20 NOTE — Progress Notes (Signed)
 Daily Session Note  Patient Details  Name: Kirk Russell MRN: [id number] Date of Birth: 06-19-1945 Referring Provider:   Flowsheet Row Cardiac Rehab from 07/11/2023 in [facility] Cardiac and Pulmonary Rehab  Referring Provider Dorothyann Peng, MD       Encounter Date: 07/20/2023  Check In:  Session Check In - 07/20/23 0936       Check-In   Supervising physician immediately available to respond to emergencies See telemetry face sheet for immediately available ER MD    Location [facility]-Cardiac & Pulmonary Rehab    Staff Present Cora Collum, RN, BSN, CCRP;Joseph Hood RCP,RRT,BSRT;Noah Tickle, [place], Exercise Physiologist    Virtual Visit No    Medication changes reported     No    Fall or balance concerns reported    No    Warm-up and Cool-down Performed on first and last piece of equipment    Resistance Training Performed Yes    VAD Patient? No    PAD/SET Patient? No      Pain Assessment   Currently in Pain? No/denies                Social History   Tobacco Use  Smoking Status Former   Current packs/day: 0.00   Types: Cigarettes   Quit date: 1990   Years since quitting: 35.2  Smokeless Tobacco Former   Quit date: 04/24/1988    Goals Met:  Independence with exercise equipment Exercise tolerated well No report of concerns or symptoms today  Goals Unmet:  Not Applicable  Comments: Pt able to follow exercise prescription today without complaint.  Will continue to monitor for progression.    Dr. Bethann Punches is Medical Director for [facility] Cardiac Rehabilitation.  Dr. Vida Rigger is Medical Director for [facility] Pulmonary Rehabilitation.

## 2023-07-23 ENCOUNTER — Encounter: Admitting: *Deleted

## 2023-07-23 DIAGNOSIS — Z951 Presence of aortocoronary bypass graft: Secondary | ICD-10-CM

## 2023-07-23 DIAGNOSIS — Z48812 Encounter for surgical aftercare following surgery on the circulatory system: Secondary | ICD-10-CM | POA: Diagnosis not present

## 2023-07-23 NOTE — Progress Notes (Signed)
 Daily Session Note  Patient Details  Name: Chritopher Coster MRN: [id number] Date of Birth: 09/07/1945 Referring Provider:   Flowsheet Row Cardiac Rehab from 07/11/2023 in [facility] Cardiac and Pulmonary Rehab  Referring Provider Dorothyann Peng, MD       Encounter Date: 07/23/2023  Check In:  Session Check In - 07/23/23 4098       Check-In   Supervising physician immediately available to respond to emergencies See telemetry face sheet for immediately available ER MD    Location [facility]-Cardiac & Pulmonary Rehab    Staff Present Cora Collum, RN, BSN, CCRP;Margaret Best, MS, Exercise Physiologist;Maxon Conetta BS, Exercise Physiologist;Kelly Cloretta Ned, ACSM CEP, Exercise Physiologist    Virtual Visit No    Medication changes reported     No    Fall or balance concerns reported    No    Warm-up and Cool-down Performed on first and last piece of equipment    Resistance Training Performed Yes    VAD Patient? No    PAD/SET Patient? No      Pain Assessment   Currently in Pain? No/denies                Social History   Tobacco Use  Smoking Status Former   Current packs/day: 0.00   Types: Cigarettes   Quit date: 1990   Years since quitting: 35.2  Smokeless Tobacco Former   Quit date: 04/24/1988    Goals Met:  Independence with exercise equipment Exercise tolerated well No report of concerns or symptoms today  Goals Unmet:  Not Applicable  Comments: Pt able to follow exercise prescription today without complaint.  Will continue to monitor for progression.    Dr. Bethann Punches is Medical Director for [facility] Cardiac Rehabilitation.  Dr. Vida Rigger is Medical Director for [facility] Pulmonary Rehabilitation.

## 2023-07-25 ENCOUNTER — Encounter: Attending: Internal Medicine | Admitting: *Deleted

## 2023-07-25 DIAGNOSIS — Z951 Presence of aortocoronary bypass graft: Secondary | ICD-10-CM | POA: Diagnosis present

## 2023-07-25 NOTE — Progress Notes (Signed)
 Daily Session Note  Patient Details  Name: Casmer Yepiz MRN: [id number] Date of Birth: [birth year] Referring Provider:   Flowsheet Row Cardiac Rehab from 07/11/2023 in [facility] Cardiac and Pulmonary Rehab  Referring Provider Dorothyann Peng, MD       Encounter Date: 07/25/2023  Check In:  Session Check In - 07/25/23 1011       Check-In   Supervising physician immediately available to respond to emergencies See telemetry face sheet for immediately available ER MD    Location [facility]-Cardiac & Pulmonary Rehab    Staff Present Cora Collum, RN, BSN, CCRP;Joseph Hood RCP,RRT,BSRT;Maxon [place] BS, Exercise Physiologist;Noah Tickle, BS, Exercise Physiologist;Jason Wallace Cullens RDN,LDN    Virtual Visit No    Medication changes reported     No    Fall or balance concerns reported    No    Warm-up and Cool-down Performed on first and last piece of equipment    Resistance Training Performed Yes    VAD Patient? No    PAD/SET Patient? No      Pain Assessment   Currently in Pain? No/denies                Social History   Tobacco Use  Smoking Status Former   Current packs/day: 0.00   Types: Cigarettes   Quit date: 1990   Years since quitting: 35.2  Smokeless Tobacco Former   Quit date: 04/24/1988    Goals Met:  Independence with exercise equipment Exercise tolerated well No report of concerns or symptoms today  Goals Unmet:  Not Applicable  Comments: Pt able to follow exercise prescription today without complaint.  Will continue to monitor for progression.    Dr. Bethann Punches is Medical Director for [facility] Cardiac Rehabilitation.  Dr. Vida Rigger is Medical Director for [facility] Pulmonary Rehabilitation.

## 2023-07-27 ENCOUNTER — Encounter: Admitting: *Deleted

## 2023-07-27 DIAGNOSIS — Z951 Presence of aortocoronary bypass graft: Secondary | ICD-10-CM

## 2023-07-27 NOTE — Progress Notes (Signed)
 Daily Session Note  Patient Details  Name: Kirk Russell MRN: [id number] Date of Birth: [birth year] Referring Provider:   Flowsheet Row Cardiac Rehab from 07/11/2023 in [facility] Cardiac and Pulmonary Rehab  Referring Provider Dorothyann Peng, MD       Encounter Date: 07/27/2023  Check In:  Session Check In - 07/27/23 0942       Check-In   Supervising physician immediately available to respond to emergencies See telemetry face sheet for immediately available ER MD    Location [facility]-Cardiac & Pulmonary Rehab    Staff Present Cora Collum, RN, BSN, CCRP;Margaret Best, MS, Exercise Physiologist;Maxon Conetta BS, Exercise Physiologist;Noah Tickle, BS, Exercise Physiologist    Virtual Visit No    Medication changes reported     No    Fall or balance concerns reported    No    Warm-up and Cool-down Performed on first and last piece of equipment    Resistance Training Performed Yes    VAD Patient? No    PAD/SET Patient? No      Pain Assessment   Currently in Pain? No/denies                Social History   Tobacco Use  Smoking Status Former   Current packs/day: 0.00   Types: Cigarettes   Quit date: 1990   Years since quitting: 35.2  Smokeless Tobacco Former   Quit date: 04/24/1988    Goals Met:  Independence with exercise equipment Exercise tolerated well No report of concerns or symptoms today  Goals Unmet:  Not Applicable  Comments: Pt able to follow exercise prescription today without complaint.  Will continue to monitor for progression.    Dr. Bethann Punches is Medical Director for [facility] Cardiac Rehabilitation.  Dr. Vida Rigger is Medical Director for [facility] Pulmonary Rehabilitation.

## 2023-07-30 ENCOUNTER — Encounter: Admitting: *Deleted

## 2023-07-30 DIAGNOSIS — Z951 Presence of aortocoronary bypass graft: Secondary | ICD-10-CM

## 2023-07-30 NOTE — Progress Notes (Signed)
 Daily Session Note  Patient Details  Name: Kirk Russell MRN: [id number] Date of Birth: [birth year] Referring Provider:   Flowsheet Row Cardiac Rehab from 07/11/2023 in [facility] [facility]  Referring Provider Dorothyann Peng, MD       Encounter Date: 07/30/2023  Check In:  Session Check In - 07/30/23 1108       Check-In   Supervising physician immediately available to respond to emergencies See telemetry face sheet for immediately available ER MD    Location [facility]-Cardiac & Pulmonary Rehab    Staff Present Cora Collum, RN, BSN, CCRP;Joseph Hood RCP,RRT,BSRT;Maxon [employer], Exercise Physiologist;Kelly [employer], ACSM CEP, Exercise Physiologist    Virtual Visit No    Medication changes reported     No    Fall or balance concerns reported    No    Warm-up and Cool-down Performed on first and last piece of equipment    Resistance Training Performed Yes    VAD Patient? No    PAD/SET Patient? No      Pain Assessment   Currently in Pain? No/denies                Social History   Tobacco Use  Smoking Status Former   Current packs/day: 0.00   Types: Cigarettes   Quit date: 1990   Years since quitting: 35.2  Smokeless Tobacco Former   Quit date: 04/24/1988    Goals Met:  Independence with exercise equipment Exercise tolerated well No report of concerns or symptoms today  Goals Unmet:  Not Applicable  Comments: Pt able to follow exercise prescription today without complaint.  Will continue to monitor for progression.    Dr. Bethann Punches is Medical Director for [facility] Cardiac Rehabilitation.  Dr. Vida Rigger is Medical Director for [facility] Pulmonary Rehabilitation.

## 2023-08-01 ENCOUNTER — Encounter: Payer: Self-pay | Admitting: *Deleted

## 2023-08-01 ENCOUNTER — Encounter

## 2023-08-01 DIAGNOSIS — Z951 Presence of aortocoronary bypass graft: Secondary | ICD-10-CM

## 2023-08-01 NOTE — Progress Notes (Signed)
 Daily Session Note  Patient Details  Name: Hoang Pettingill MRN: [id number] Date of Birth: [birth year] Referring Provider:   Flowsheet Row Cardiac Rehab from 07/11/2023 in [facility] Cardiac and Pulmonary Rehab  Referring Provider Dorothyann Peng, MD       Encounter Date: 08/01/2023  Check In:  Session Check In - 08/01/23 1022       Check-In   Supervising physician immediately available to respond to emergencies See telemetry face sheet for immediately available ER MD    Location [facility]-Cardiac & Pulmonary Rehab    Staff Present Rory Percy, MS, Exercise Physiologist;Prapti Grussing RN,BSN,MPA;Maxon Conetta BS, Exercise Physiologist;Joseph Reino Kent RCP,RRT,BSRT    Virtual Visit No    Medication changes reported     No    Fall or balance concerns reported    No    Warm-up and Cool-down Performed on first and last piece of equipment    Resistance Training Performed Yes    VAD Patient? No    PAD/SET Patient? No      Pain Assessment   Currently in Pain? No/denies                Social History   Tobacco Use  Smoking Status Former   Current packs/day: 0.00   Types: Cigarettes   Quit date: 1990   Years since quitting: 35.2  Smokeless Tobacco Former   Quit date: 04/24/1988    Goals Met:  Independence with exercise equipment Exercise tolerated well No report of concerns or symptoms today Strength training completed today  Goals Unmet:  Not Applicable  Comments: Pt able to follow exercise prescription today without complaint.  Will continue to monitor for progression.    Dr. Bethann Punches is Medical Director for [facility] Cardiac Rehabilitation.  Dr. Vida Rigger is Medical Director for [facility] Pulmonary Rehabilitation.

## 2023-08-01 NOTE — Progress Notes (Signed)
 Cardiac Individual Treatment Plan  Patient Details  Name: Kirk Russell MRN: [id number] Date of Birth: 05/02/1945 Referring Provider:   Flowsheet Row Cardiac Rehab from 07/11/2023 in [facility] Cardiac and Pulmonary Rehab  Referring Provider Dorothyann Peng, MD       Initial Encounter Date:  Flowsheet Row Cardiac Rehab from 07/11/2023 in [facility] Cardiac and Pulmonary Rehab  Date 07/11/23       Visit Diagnosis: S/P CABG x 3  Patient's Home Medications on Admission:  Current Outpatient Medications:    albuterol (VENTOLIN HFA) 108 (90 Base) MCG/ACT inhaler, Inhale into the lungs every 6 (six) hours as needed for wheezing or shortness of breath., Disp: , Rfl:    alfuzosin (UROXATRAL) 10 MG 24 hr tablet, Take 1 tablet (10 mg total) by mouth daily with breakfast., Disp: 90 tablet, Rfl: 3   aspirin EC 81 MG tablet, Take 81 mg by mouth daily., Disp: , Rfl:    citalopram (CELEXA) 10 MG tablet, Take 1 tablet by mouth daily., Disp: , Rfl:    clopidogrel (PLAVIX) 75 MG tablet, Take 1 tablet (75 mg total) by mouth daily. (Patient not taking: Reported on 07/05/2023), Disp: 30 tablet, Rfl: 1   dorzolamide-timolol (COSOPT) 22.3-6.8 MG/ML ophthalmic solution, 1 drop 2 (two) times daily., Disp: , Rfl:    heparin 08657 UT/250ML infusion, Inject 1,050 Units/hr into the vein continuous., Disp: 1000 mL, Rfl: 0   isosorbide mononitrate (IMDUR) 30 MG 24 hr tablet, Take 1 tablet (30 mg total) by mouth daily., Disp: 30 tablet, Rfl: 1   latanoprost (XALATAN) 0.005 % ophthalmic solution, 1 drop at bedtime., Disp: , Rfl:    lisinopril (ZESTRIL) 2.5 MG tablet, Take 1 tablet (2.5 mg total) by mouth daily. (Patient not taking: Reported on 07/05/2023), Disp: 30 tablet, Rfl: 1   metFORMIN (GLUCOPHAGE) 500 MG tablet, Take 1 tablet by mouth daily with breakfast. (Patient not taking: Reported on 07/05/2023), Disp: , Rfl:    nitroGLYCERIN (NITROSTAT) 0.4 MG SL tablet, Place 0.4 mg under the tongue every 5 (five) minutes as  needed for chest pain., Disp: , Rfl:    ranolazine (RANEXA) 1000 MG SR tablet, Take 1 tablet (1,000 mg total) by mouth 2 (two) times daily. 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 ears     Tobacco Use: Social History   Tobacco Use  Smoking Status Former   Current packs/day: 0.00   Types: Cigarettes   Quit date: 1990   Years since quitting: 35.2  Smokeless Tobacco Former   Quit date: 04/24/1988    Labs: Review Flowsheet       Latest Ref Rng & Units 12/14/2022 12/24/2022  Labs for ITP Cardiac and Pulmonary Rehab  Cholestrol  0 - 200 mg/dL - 213   LDL (calc) 0 - 99 mg/dL - 75   HDL-C >08 mg/dL - 51   Trlycerides <657 mg/dL - 87   Hemoglobin Q4O 4.8 - 5.6 % 6.1  -     Exercise Target Goals: Exercise Program Goal: Individual exercise prescription set using results from initial 6 min walk test and THRR while considering  patient's activity barriers and safety.   Exercise Prescription Goal: Initial exercise prescription builds to 30-45 minutes a day of aerobic activity, 2-3 days per week.  Home exercise guidelines will be given to patient during program as part of exercise prescription that the participant will acknowledge.   Education: Aerobic Exercise: - Group verbal and visual presentation on the components of exercise  prescription. Introduces F.I.T.T principle from ACSM for exercise prescriptions.  Reviews F.I.T.T. principles of aerobic exercise including progression. Written material given at graduation. Flowsheet Row Cardiac Rehab from 07/18/2023 in [facility] Cardiac and Pulmonary Rehab  Education need identified 07/11/23       Education: Resistance Exercise: - Group verbal and visual presentation on the components of exercise prescription. Introduces F.I.T.T principle from ACSM for exercise prescriptions  Reviews F.I.T.T. principles of resistance exercise including progression. Written material given at graduation.    Education: Exercise & Equipment Safety: - Individual verbal instruction and demonstration of equipment use and safety with use of the equipment. Flowsheet Row Cardiac Rehab from 07/18/2023 in [facility] Cardiac and Pulmonary Rehab  Date 07/11/23  Educator MB  Instruction Review Code 1- Verbalizes Understanding       Education: Exercise Physiology & General Exercise Guidelines: - Group verbal and written instruction with models to review the exercise physiology of the cardiovascular system and associated critical values. Provides general exercise guidelines with specific guidelines to those with heart or lung disease.    Education: Flexibility, Balance, Mind/Body Relaxation: - Group verbal and visual presentation with interactive activity on the components of exercise prescription. Introduces F.I.T.T principle from ACSM for exercise prescriptions. Reviews F.I.T.T. principles of flexibility and balance exercise training including progression. Also discusses the mind body connection.  Reviews various relaxation techniques to help reduce and manage stress (i.e. Deep breathing, progressive muscle relaxation, and visualization). Balance handout provided to take home. Written material given at graduation.   Activity Barriers & Risk Stratification:  Activity Barriers & Cardiac Risk Stratification -  07/11/23 1142       Activity Barriers & Cardiac Risk Stratification   Activity Barriers Shortness of Breath;Other (comment)    Comments Neuropathy    Cardiac Risk Stratification High             6 Minute Walk:  6 Minute Walk     Row Name 07/11/23 1141         6 Minute Walk   Phase Initial     Distance 1135 feet     Walk Time 6 minutes     # of Rest Breaks 0     MPH 2.15     METS 2.04     RPE 9     Perceived Dyspnea  0     VO2 Peak 7.14     Symptoms No     Resting HR 67 bpm     Resting BP 110/70     Resting Oxygen Saturation  98 %     Exercise Oxygen Saturation  during 6 min walk 96 %     Max Ex. HR 94 bpm     Max Ex. BP  130/70     2 Minute Post BP 110/72              Oxygen Initial Assessment:   Oxygen Re-Evaluation:   Oxygen Discharge (Final Oxygen Re-Evaluation):   Initial Exercise Prescription:  Initial Exercise Prescription - 07/11/23 1100       Date of Initial Exercise RX and Referring Provider   Date 07/11/23    Referring Provider Dorothyann Peng, MD      Oxygen   Maintain Oxygen Saturation 88% or higher      Treadmill   MPH 1.8    Grade 0    Minutes 15    METs 2.38      Recumbant Bike   Level 2    RPM 50    Watts 15    Minutes 15    METs 2.04      NuStep   Level 2    SPM 80    Minutes 15    METs 2.04      REL-XR   Level 1    Speed 50    Minutes 15    METs 2.04      T5 Nustep   Level 2   T6   SPM 80    Minutes 15    METs 2.04      Prescription Details   Frequency (times per week) 3    Duration Progress to 30 minutes of continuous aerobic without signs/symptoms of physical distress      Intensity   THRR 40-80% of Max Heartrate 97-127    Ratings of Perceived Exertion 11-13    Perceived Dyspnea 0-4      Progression   Progression Continue to progress workloads to maintain intensity without signs/symptoms of physical distress.      Resistance Training   Training Prescription Yes    Weight 7 lb    Reps  10-15             Perform Capillary Blood Glucose checks as needed.  Exercise Prescription Changes:   Exercise Prescription Changes     Row Name 07/11/23 1100 07/25/23 1100           Response to Exercise   Blood Pressure (Admit) 110/70 118/64      Blood Pressure (Exercise) 130/70 124/70      Blood Pressure (Exit) 110/72 102/68      Heart Rate (Admit) 67 bpm 77 bpm      Heart Rate (Exercise) 94 bpm 108 bpm      Heart Rate (Exit) 67 bpm 74 bpm      Oxygen Saturation (Admit) 98 % --      Oxygen Saturation (Exercise) 96 % --      Oxygen Saturation (Exit) 98 % --      Rating of Perceived Exertion (Exercise) 9 13      Perceived Dyspnea (Exercise) 0 0      Symptoms none none      Comments results first 2 weeks of exercise      Duration Progress to 30 minutes of  aerobic without signs/symptoms of physical distress Progress to 30 minutes of  aerobic without signs/symptoms of physical distress      Intensity THRR New THRR New        Progression   Progression Continue to progress workloads to maintain intensity without signs/symptoms of physical distress. Continue to progress workloads to maintain intensity without signs/symptoms of physical distress.      Average METs 2.04 3.12  Resistance Training   Training Prescription -- Yes      Weight -- 7 lb      Reps -- 10-15        Interval Training   Interval Training -- No        Treadmill   MPH -- 1.9      Grade -- 0      Minutes -- 15      METs -- 2.45        REL-XR   Level -- 2      Minutes -- 15      METs -- 3.9        Track   Laps -- 42      Minutes -- 15      METs -- 3.28        Oxygen   Maintain Oxygen Saturation -- 88% or higher               Exercise Comments:   Exercise Comments     Row Name 07/16/23 [phone]           Exercise Comments First full day of exercise!  Patient was oriented to gym and equipment including functions, settings, policies, and procedures.  Patient's individual  exercise prescription and treatment plan were reviewed.  All starting workloads were established based on the results of the 6 minute walk test done at initial orientation visit.  The plan for exercise progression was also introduced and progression will be customized based on patient's performance and goals.                Exercise Goals and Review:   Exercise Goals     Row Name 07/11/23 1147             Exercise Goals   Increase Physical Activity Yes       Intervention Provide advice, education, support and counseling about physical activity/exercise needs.;Develop an individualized exercise prescription for aerobic and resistive training based on initial evaluation findings, risk stratification, comorbidities and participant's personal goals.       Expected Outcomes Short Term: Attend rehab on a regular basis to increase amount of physical activity.;Long Term: Add in home exercise to make exercise part of routine and to increase amount of physical activity.;Long Term: Exercising regularly at least 3-5 days a week.       Increase Strength and Stamina Yes       Intervention Provide advice, education, support and counseling about physical activity/exercise needs.;Develop an individualized exercise prescription for aerobic and resistive training based on initial evaluation findings, risk stratification, comorbidities and participant's personal goals.       Expected Outcomes Short Term: Increase workloads from initial exercise prescription for resistance, speed, and METs.;Short Term: Perform resistance training exercises routinely during rehab and add in resistance training at home;Long Term: Improve cardiorespiratory fitness, muscular endurance and strength as measured by increased METs and functional capacity ( )       Able to understand and use rate of perceived exertion (RPE) scale Yes       Intervention Provide education and explanation on how to use RPE scale       Expected Outcomes  Short Term: Able to use RPE daily in rehab to express subjective intensity level;Long Term:  Able to use RPE to guide intensity level when exercising independently       Able to understand and use Dyspnea scale Yes       Intervention Provide education and explanation on  how to use Dyspnea scale       Expected Outcomes Short Term: Able to use Dyspnea scale daily in rehab to express subjective sense of shortness of breath during exertion;Long Term: Able to use Dyspnea scale to guide intensity level when exercising independently       Knowledge and understanding of Target Heart Rate Range (THRR) Yes       Intervention Provide education and explanation of THRR including how the numbers were predicted and where they are located for reference       Expected Outcomes Short Term: Able to state/look up THRR;Short Term: Able to use daily as guideline for intensity in rehab;Long Term: Able to use THRR to govern intensity when exercising independently       Able to check pulse independently Yes       Intervention Provide education and demonstration on how to check pulse in carotid and radial arteries.;Review the importance of being able to check your own pulse for safety during independent exercise       Expected Outcomes Short Term: Able to explain why pulse checking is important during independent exercise;Long Term: Able to check pulse independently and accurately       Understanding of Exercise Prescription Yes       Intervention Provide education, explanation, and written materials on patient's individual exercise prescription       Expected Outcomes Short Term: Able to explain program exercise prescription;Long Term: Able to explain home exercise prescription to exercise independently                Exercise Goals Re-Evaluation :  Exercise Goals Re-Evaluation     Row Name 07/16/23 0956 07/25/23 1147           Exercise Goal Re-Evaluation   Exercise Goals Review Able to understand and use rate  of perceived exertion (RPE) scale;Able to understand and use Dyspnea scale;Knowledge and understanding of Target Heart Rate Range (THRR);Understanding of Exercise Prescription Increase Physical Activity;Increase Strength and Stamina;Understanding of Exercise Prescription      Comments Reviewed RPE and dyspnea scale, THR and program prescription with pt today.  Pt voiced understanding and was given a copy of goals to take home. Kirk Russell is off to a good start in the program. He was able to use the Treadmill, XR, and track during his first 3 sessions. He was able to increase to a speed of 1. on the treadmill, and increase to level 2 on the XR. We will continue to monitor his progress in the program.      Expected Outcomes Short: Use RPE daily to regulate intensity. Long: Follow program prescription in THR. Short: Continue to increase treadmill workload. Long: Continue exercise to improve strength and stamina.               Discharge Exercise Prescription (Final Exercise Prescription Changes):  Exercise Prescription Changes - 07/25/23 1100       Response to Exercise   Blood Pressure (Admit) 118/64    Blood Pressure (Exercise) 124/70    Blood Pressure (Exit) 102/68    Heart Rate (Admit) 77 bpm    Heart Rate (Exercise) 108 bpm    Heart Rate (Exit) 74 bpm    Rating of Perceived Exertion (Exercise) 13    Perceived Dyspnea (Exercise) 0    Symptoms none    Comments first 2 weeks of exercise    Duration Progress to 30 minutes of  aerobic without signs/symptoms of physical distress    Intensity THRR  New      Progression   Progression Continue to progress workloads to maintain intensity without signs/symptoms of physical distress.    Average METs 3.12      Resistance Training   Training Prescription Yes    Weight 7 lb    Reps 10-15      Interval Training   Interval Training No      Treadmill   MPH 1.9    Grade 0    Minutes 15    METs 2.45      REL-XR   Level 2    Minutes 15     METs 3.9      Track   Laps 42    Minutes 15    METs 3.28      Oxygen   Maintain Oxygen Saturation 88% or higher             Nutrition:  Target Goals: Understanding of nutrition guidelines, daily intake of sodium 1500mg , cholesterol 200mg , calories 30% from fat and 7% or less from saturated fats, daily to have 5 or more servings of fruits and vegetables.  Education: All About Nutrition: -Group instruction provided by verbal, written material, interactive activities, discussions, models, and posters to present general guidelines for heart healthy nutrition including fat, fiber, MyPlate, the role of sodium in heart healthy nutrition, utilization of the nutrition label, and utilization of this knowledge for meal planning. Follow up email sent as well. Written material given at graduation. Flowsheet Row Cardiac Rehab from 07/18/2023 in [facility] Cardiac and Pulmonary Rehab  Education need identified 07/11/23  Date 07/18/23  Educator JG  Instruction Review Code 1- Verbalizes Understanding       Biometrics:  Pre Biometrics - 07/11/23 1149       Pre Biometrics   Height 5' 8.9" (1.75 m)    Weight 202 lb 12.8 oz (92 kg)    Waist Circumference 43 inches    Hip Circumference 44.5 inches    Waist to Hip Ratio 0.97 %    BMI (Calculated) 30.04    Single Leg Stand 3 seconds              Nutrition Therapy Plan and Nutrition Goals:  Nutrition Therapy & Goals - 07/11/23 1153       Nutrition Therapy   RD appointment deferred Yes      Personal Nutrition Goals   Nutrition Goal RD appointment deferred at this time      Intervention Plan   Intervention Prescribe, educate and counsel regarding individualized specific dietary modifications aiming towards targeted core components such as weight, hypertension, lipid management, diabetes, heart failure and other comorbidities.    Expected Outcomes Short Term Goal: Understand basic principles of dietary content, such as calories, fat,  sodium, cholesterol and nutrients.             Nutrition Assessments:  MEDIFICTS Score Key: >=70 Need to make dietary changes  40-70 Heart Healthy Diet <= 40 Therapeutic Level Cholesterol Diet  Flowsheet Row Cardiac Rehab from 07/11/2023 in [facility] Cardiac and Pulmonary Rehab  Picture Your Plate Total Score on Admission 36      Picture Your Plate Scores: <69 Unhealthy dietary pattern with much room for improvement. 41-50 Dietary pattern unlikely to meet recommendations for good health and room for improvement. 51-60 More healthful dietary pattern, with some room for improvement.  >60 Healthy dietary pattern, although there may be some specific behaviors that could be improved.    Nutrition Goals Re-Evaluation:  Nutrition Goals Discharge (Final Nutrition Goals Re-Evaluation):   Psychosocial: Target Goals: Acknowledge presence or absence of significant depression and/or stress, maximize coping skills, provide positive support system. Participant is able to verbalize types and ability to use techniques and skills needed for reducing stress and depression.   Education: Stress, Anxiety, and Depression - Group verbal and visual presentation to define topics covered.  Reviews how body is impacted by stress, anxiety, and depression.  Also discusses healthy ways to reduce stress and to treat/manage anxiety and depression.  Written material given at graduation.   Education: Sleep Hygiene -Provides group verbal and written instruction about how sleep can affect your health.  Define sleep hygiene, discuss sleep cycles and impact of sleep habits. Review good sleep hygiene tips.    Initial Review & Psychosocial Screening:  Initial Psych Review & Screening - 07/05/23 1341       Initial Review   Current issues with Current Stress Concerns;Current Anxiety/Panic    Source of Stress Concerns Unable to perform yard/household activities    Comments Aniety over managing a large estate for  sister,   cant get out golfing yet.      Family Dynamics   Good Support System? Yes   wife     Barriers   Psychosocial barriers to participate in program There are no identifiable barriers or psychosocial needs.      Screening Interventions   Interventions To provide support and resources with identified psychosocial needs;Provide feedback about the scores to participant;Encouraged to exercise    Expected Outcomes Short Term goal: Utilizing psychosocial counselor, staff and physician to assist with identification of specific Stressors or current issues interfering with healing process. Setting desired goal for each stressor or current issue identified.;Long Term Goal: Stressors or current issues are controlled or eliminated.;Short Term goal: Identification and review with participant of any Quality of Life or Depression concerns found by scoring the questionnaire.;Long Term goal: The participant improves quality of Life and PHQ9 Scores as seen by post scores and/or verbalization of changes             Quality of Life Scores:   Quality of Life - 07/11/23 1150       Quality of Life   Select Quality of Life      Quality of Life Scores   Health/Function Pre 17.43 %    Socioeconomic Pre 23.83 %    Psych/Spiritual Pre 22.71 %    Family Pre 24.6 %    GLOBAL Pre 20.8 %            Scores of 19 and below usually indicate a poorer quality of life in these areas.  A difference of  2-3 points is a clinically meaningful difference.  A difference of 2-3 points in the total score of the Quality of Life Index has been associated with significant improvement in overall quality of life, self-image, physical symptoms, and general health in studies assessing change in quality of life.  PHQ-9: Review Flowsheet       07/11/2023  Depression screen PHQ 2/9  Decreased Interest 1  Down, Depressed, Hopeless 0  PHQ - 2 Score 1  Altered sleeping 0  Tired, decreased energy 1  Change in appetite 0   Feeling bad or failure about yourself  0  Trouble concentrating 2  Moving slowly or fidgety/restless 0  Suicidal thoughts 0  PHQ-9 Score 4  Difficult doing work/chores Not difficult at all   Interpretation of Total Score  Total Score Depression  Severity:  1-4 = Minimal depression, 5-9 = Mild depression, 10-14 = Moderate depression, 15-19 = Moderately severe depression, 20-27 = Severe depression   Psychosocial Evaluation and Intervention:  Psychosocial Evaluation - 07/05/23 1357       Psychosocial Evaluation & Interventions   Interventions Encouraged to exercise with the program and follow exercise prescription    Comments There are no barriers to his starting the program.  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  He lives with his wife and she is his support.  He controls his diabetes with diet and exercise.  He would like to drop 15 pounds. He is rady to strt the program    Expected Outcomes STG attend all scheduled sessions, meet with the RD and work on exercise progression as advised by EP.   LTG aintain exercise progression and get back on the golf course.    Continue Psychosocial Services  Follow up required by staff             Psychosocial Re-Evaluation:   Psychosocial Discharge (Final Psychosocial Re-Evaluation):   Vocational Rehabilitation: Provide vocational rehab assistance to qualifying candidates.   Vocational Rehab Evaluation & Intervention:   Education: Education Goals: Education classes will be provided on a variety of topics geared toward better understanding of heart health and risk factor modification. Participant will state understanding/return demonstration of topics presented as noted by education test scores.  Learning Barriers/Preferences:  Learning Barriers/Preferences - 07/05/23 1346       Learning Barriers/Preferences   Learning Barriers Hearing     Learning Preferences None             General Cardiac Education Topics:  AED/CPR: - Group verbal and written instruction with the use of models to demonstrate the basic use of the AED with the basic ABC's of resuscitation.   Anatomy and Cardiac Procedures: - Group verbal and visual presentation and models provide information about basic cardiac anatomy and function. Reviews the testing methods done to diagnose heart disease and the outcomes of the test results. Describes the treatment choices: Medical Management, Angioplasty, or Coronary Bypass Surgery for treating various heart conditions including Myocardial Infarction, Angina, Valve Disease, and Cardiac Arrhythmias.  Written material given at graduation. Flowsheet Row Cardiac Rehab from 07/18/2023 in [facility] Cardiac and Pulmonary Rehab  Education need identified 07/11/23       Medication Safety: - Group verbal and visual instruction to review commonly prescribed medications for heart and lung disease. Reviews the medication, class of the drug, and side effects. Includes the steps to properly store meds and maintain the prescription regimen.  Written material given at graduation.   Intimacy: - Group verbal instruction through game format to discuss how heart and lung disease can affect sexual intimacy. Written material given at graduation..   Know Your Numbers and Heart Failure: - Group verbal and visual instruction to discuss disease risk factors for cardiac and pulmonary disease and treatment options.  Reviews associated critical values for Overweight/Obesity, Hypertension, Cholesterol, and Diabetes.  Discusses basics of heart failure: signs/symptoms and treatments.  Introduces Heart Failure Zone chart for action plan for heart failure.  Written material given at graduation. Flowsheet Row Cardiac Rehab from 07/18/2023 in [facility] Cardiac and Pulmonary Rehab  Education need identified 07/11/23       Infection Prevention: - Provides  verbal and written material to individual with discussion of infection  control including proper hand washing and proper equipment cleaning during exercise session. Flowsheet Row Cardiac Rehab from 07/18/2023 in [facility] Cardiac and Pulmonary Rehab  Date 07/11/23  Educator MB  Instruction Review Code 1- Verbalizes Understanding       Falls Prevention: - Provides verbal and written material to individual with discussion of falls prevention and safety. Flowsheet Row Cardiac Rehab from 07/18/2023 in [facility] Cardiac and Pulmonary Rehab  Date 07/11/23  Educator MB  Instruction Review Code 1- Verbalizes Understanding       Other: -Provides group and verbal instruction on various topics (see comments)   Knowledge Questionnaire Score:  Knowledge Questionnaire Score - 07/11/23 1154       Knowledge Questionnaire Score   Pre Score 21/26             Core Components/Risk Factors/Patient Goals at Admission:  Personal Goals and Risk Factors at Admission - 07/11/23 1154       Core Components/Risk Factors/Patient Goals on Admission    Weight Management Yes    Intervention Weight Management: Develop a combined nutrition and exercise program designed to reach desired caloric intake, while maintaining appropriate intake of nutrient and fiber, sodium and fats, and appropriate energy expenditure required for the weight goal.    Admit Weight 202 lb 12.8 oz (92 kg)    Goal Weight: Short Term 198 lb (89.8 kg)    Goal Weight: Long Term 185 lb (83.9 kg)    Expected Outcomes Short Term: Continue to assess and modify interventions until short term weight is achieved;Long Term: Adherence to nutrition and physical activity/exercise program aimed toward attainment of established weight goal;Weight Loss: Understanding of general recommendations for a balanced deficit meal plan, which promotes 1-2 lb weight loss per week and includes a negative energy balance of [phone] kcal/d;Understanding recommendations for  meals to include 15-35% energy as protein, 25-35% energy from fat, 35-60% energy from carbohydrates, less than 200mg  of dietary cholesterol, 20-35 gm of total fiber daily;Understanding of distribution of calorie intake throughout the day with the consumption of 4-5 meals/snacks    Improve shortness of breath with ADL's Yes    Intervention Provide education, individualized exercise plan and daily activity instruction to help decrease symptoms of SOB with activities of daily living.    Expected Outcomes Short Term: Improve cardiorespiratory fitness to achieve a reduction of symptoms when performing ADLs;Long Term: Be able to perform more ADLs without symptoms or delay the onset of symptoms    Diabetes Yes   Diet-controlled   Intervention Provide education about signs/symptoms and action to take for hypo/hyperglycemia.;Provide education about proper nutrition, including hydration, and aerobic/resistive exercise prescription along with prescribed medications to achieve blood glucose in normal ranges: Fasting glucose 65-99 mg/dL    Expected Outcomes Short Term: Participant verbalizes understanding of the signs/symptoms and immediate care of hyper/hypoglycemia, proper foot care and importance of medication, aerobic/resistive exercise and nutrition plan for blood glucose control.;Long Term: Attainment of HbA1C < 7%.    Hypertension Yes    Intervention Provide education on lifestyle modifcations including regular physical activity/exercise, weight management, moderate sodium restriction and increased consumption of fresh fruit, vegetables, and low fat dairy, alcohol moderation, and smoking cessation.;Monitor prescription use compliance.    Expected Outcomes Short Term: Continued assessment and intervention until BP is < 140/62mm HG in hypertensive participants. < 130/52mm HG in hypertensive participants with diabetes, heart failure or chronic kidney disease.;Long Term: Maintenance of blood pressure at goal levels.     Lipids Yes  Intervention Provide education and support for participant on nutrition & aerobic/resistive exercise along with prescribed medications to achieve LDL 70mg , HDL >40mg .    Expected Outcomes Short Term: Participant states understanding of desired cholesterol values and is compliant with medications prescribed. Participant is following exercise prescription and nutrition guidelines.;Long Term: Cholesterol controlled with medications as prescribed, with individualized exercise RX and with personalized nutrition plan. Value goals: LDL < 70mg , HDL > 40 mg.             Education:Diabetes - Individual verbal and written instruction to review signs/symptoms of diabetes, desired ranges of glucose level fasting, after meals and with exercise. Acknowledge that pre and post exercise glucose checks will be done for 3 sessions at entry of program. Flowsheet Row Cardiac Rehab from 07/18/2023 in [facility] Cardiac and Pulmonary Rehab  Date 07/11/23  Educator MB  Instruction Review Code 1- Verbalizes Understanding       Core Components/Risk Factors/Patient Goals Review:    Core Components/Risk Factors/Patient Goals at Discharge (Final Review):    ITP Comments:  ITP Comments     Row Name 07/05/23 1357 07/11/23 1141 07/16/23 0956 08/01/23 0737     ITP Comments Virtual orientation call completed today. he has an appointment on Date: 07/11/2023  for EP eval and gym Orientation.  Documentation of diagnosis can be found in [facility] 06/27/2023 . Completed and gym orientation. Initial ITP created and sent for review to Dr. Bethann Punches, Medical Director. First full day of exercise!  Patient was oriented to gym and equipment including functions, settings, policies, and procedures.  Patient's individual exercise prescription and treatment plan were reviewed.  All starting workloads were established based on the results of the 6 minute walk test done at initial orientation visit.  The plan for exercise  progression was also introduced and progression will be customized based on patient's performance and goals. 30 Day review completed. Medical Director ITP review done, changes made as directed, and signed approval by Medical Director.    new to program             Comments:

## 2023-08-03 ENCOUNTER — Encounter

## 2023-08-06 ENCOUNTER — Encounter: Admitting: *Deleted

## 2023-08-06 DIAGNOSIS — Z951 Presence of aortocoronary bypass graft: Secondary | ICD-10-CM | POA: Diagnosis not present

## 2023-08-06 NOTE — Progress Notes (Signed)
 Daily Session Note  Patient Details  Name: Kirk Russell MRN: [id number] Date of Birth: Jul 14, 1945 Referring Provider:   Flowsheet Row Cardiac Rehab from 07/11/2023 in [facility] Cardiac and Pulmonary Rehab  Referring Provider Burney Carter, MD       Encounter Date: 08/06/2023  Check In:  Session Check In - 08/06/23 0941       Check-In   Supervising physician immediately available to respond to emergencies See telemetry face sheet for immediately available ER MD    Location [facility]-Cardiac & Pulmonary Rehab    Staff Present Lyell Samuel, MS, Exercise Physiologist;Kelly Sabra Cramp BS, ACSM CEP, Exercise Physiologist;Maxon Conetta BS, Exercise Physiologist;Bailen Geffre, RN, BSN, CCRP    Virtual Visit No    Medication changes reported     No    Fall or balance concerns reported    No    Warm-up and Cool-down Performed on first and last piece of equipment    Resistance Training Performed Yes    VAD Patient? No    PAD/SET Patient? No      Pain Assessment   Currently in Pain? No/denies                Social History   Tobacco Use  Smoking Status Former   Current packs/day: 0.00   Types: Cigarettes   Quit date: 1990   Years since quitting: 35.3  Smokeless Tobacco Former   Quit date: 04/24/1988    Goals Met:  Independence with exercise equipment Exercise tolerated well No report of concerns or symptoms today  Goals Unmet:  Not Applicable  Comments: Pt able to follow exercise prescription today without complaint.  Will continue to monitor for progression.   Fall on Wed last week.  No injury.  Leg gave out.    Dr. Firman Hughes is Medical Director for [facility] Cardiac Rehabilitation.  Dr. Fuad Aleskerov is Medical Director for [facility] Pulmonary Rehabilitation.

## 2023-08-08 ENCOUNTER — Encounter: Admitting: *Deleted

## 2023-08-08 DIAGNOSIS — Z951 Presence of aortocoronary bypass graft: Secondary | ICD-10-CM

## 2023-08-08 NOTE — Progress Notes (Signed)
 Daily Session Note  Patient Details  Name: Zeeshan Korte MRN: [id number] Date of Birth: 27-Jan-1946 Referring Provider:   Flowsheet Row Cardiac Rehab from 07/11/2023 in [facility] Cardiac and Pulmonary Rehab  Referring Provider Burney Carter, MD       Encounter Date: 08/08/2023  Check In:  Session Check In - 08/08/23 1119       Check-In   Supervising physician immediately available to respond to emergencies See telemetry face sheet for immediately available ER MD    Location [facility]-Cardiac & Pulmonary Rehab    Staff Present Maud Sorenson, RN, BSN, CCRP;Margaret Best, MS, Exercise Physiologist;Maxon Beau[place] Bounds, Exercise Physiologist;Meredith Manson Seitz RN,BSN    Virtual Visit No    Medication changes reported     No    Fall or balance concerns reported    No    Warm-up and Cool-down Performed on first and last piece of equipment    Resistance Training Performed Yes    VAD Patient? No    PAD/SET Patient? No      Pain Assessment   Currently in Pain? No/denies                Social History   Tobacco Use  Smoking Status Former   Current packs/day: 0.00   Types: Cigarettes   Quit date: 1990   Years since quitting: 35.3  Smokeless Tobacco Former   Quit date: 04/24/1988    Goals Met:  Independence with exercise equipment Exercise tolerated well No report of concerns or symptoms today  Goals Unmet:  Not Applicable  Comments: Pt able to follow exercise prescription today without complaint.  Will continue to monitor for progression.    Dr. Firman Hughes is Medical Director for [facility] Cardiac Rehabilitation.  Dr. Fuad Aleskerov is Medical Director for [facility] Pulmonary Rehabilitation.

## 2023-08-10 ENCOUNTER — Encounter: Admitting: *Deleted

## 2023-08-10 DIAGNOSIS — Z951 Presence of aortocoronary bypass graft: Secondary | ICD-10-CM

## 2023-08-10 NOTE — Progress Notes (Signed)
 Daily Session Note  Patient Details  Name: Bundren Vantrease MRN: [id number] Date of Birth: February 21, 1946 Referring Provider:   Flowsheet Row Cardiac Rehab from 07/11/2023 in [facility] Cardiac and Pulmonary Rehab  Referring Provider Florencio Kava, MD       Encounter Date: 08/10/2023  Check In:  Session Check In - 08/10/23 1015       Check-In   Supervising physician immediately available to respond to emergencies See telemetry face sheet for immediately available ER MD    Location [facility]-Cardiac & Pulmonary Rehab    Staff Present Maxon Burnell HECKLE, Exercise Physiologist;Amalie Koran Jacques RN,BSN;Noah Tickle, BS, Exercise Physiologist;Kelly Dyane BS, ACSM CEP, Exercise Physiologist    Virtual Visit No    Medication changes reported     No    Fall or balance concerns reported    No    Tobacco Cessation No Change    Warm-up and Cool-down Performed on first and last piece of equipment    Resistance Training Performed Yes    VAD Patient? No    PAD/SET Patient? No      Pain Assessment   Currently in Pain? No/denies                Social History   Tobacco Use  Smoking Status Former   Current packs/day: 0.00   Types: Cigarettes   Quit date: 1990   Years since quitting: 35.3  Smokeless Tobacco Former   Quit date: 04/24/1988    Goals Met:  Independence with exercise equipment Exercise tolerated well No report of concerns or symptoms today Strength training completed today  Goals Unmet:  Not Applicable  Comments: Pt able to follow exercise prescription today without complaint.  Will continue to monitor for progression.   Dr. Oneil Pinal is Medical Director for [facility] Cardiac Rehabilitation.  Dr. Fuad Aleskerov is Medical Director for [facility] Pulmonary Rehabilitation.

## 2023-08-13 ENCOUNTER — Encounter: Admitting: *Deleted

## 2023-08-13 DIAGNOSIS — Z951 Presence of aortocoronary bypass graft: Secondary | ICD-10-CM | POA: Diagnosis not present

## 2023-08-13 NOTE — Progress Notes (Signed)
 Daily Session Note  Patient Details  Name: Benyamin Jeff MRN: [id number] Date of Birth: Feb 18, 1946 Referring Provider:   Flowsheet Row Cardiac Rehab from 07/11/2023 in [facility] Cardiac and Pulmonary Rehab  Referring Provider Burney Carter, MD       Encounter Date: 08/13/2023  Check In:  Session Check In - 08/13/23 0943       Check-In   Supervising physician immediately available to respond to emergencies See telemetry face sheet for immediately available ER MD    Location [facility]-Cardiac & Pulmonary Rehab    Staff Present Maud Sorenson, RN, BSN, CCRP;Joseph Hood RCP,RRT,BSRT;Maxon Pillow BS, Exercise Physiologist;Kelly [employer], ACSM CEP, Exercise Physiologist    Virtual Visit No    Medication changes reported     No    Fall or balance concerns reported    No    Warm-up and Cool-down Performed on first and last piece of equipment    Resistance Training Performed Yes    VAD Patient? No    PAD/SET Patient? No      Pain Assessment   Currently in Pain? No/denies                Social History   Tobacco Use  Smoking Status Former   Current packs/day: 0.00   Types: Cigarettes   Quit date: 1990   Years since quitting: 35.3  Smokeless Tobacco Former   Quit date: 04/24/1988    Goals Met:  Independence with exercise equipment Exercise tolerated well No report of concerns or symptoms today  Goals Unmet:  Not Applicable  Comments: Pt able to follow exercise prescription today without complaint.  Will continue to monitor for progression.    Dr. Firman Hughes is Medical Director for [facility] Cardiac Rehabilitation.  Dr. Fuad Aleskerov is Medical Director for [facility] Pulmonary Rehabilitation.

## 2023-08-15 ENCOUNTER — Encounter: Admitting: *Deleted

## 2023-08-15 DIAGNOSIS — Z951 Presence of aortocoronary bypass graft: Secondary | ICD-10-CM

## 2023-08-15 NOTE — Progress Notes (Signed)
 Daily Session Note  Patient Details  Name: Kirk Russell MRN: [id number] Date of Birth: [birth year] Referring Provider:   Flowsheet Row Cardiac Rehab from 07/11/2023 in [facility] Cardiac and Pulmonary Rehab  Referring Provider Burney Carter, MD       Encounter Date: 08/15/2023  Check In:  Session Check In - 08/15/23 0955       Check-In   Supervising physician immediately available to respond to emergencies See telemetry face sheet for immediately available ER MD    Location [facility]-Cardiac & Pulmonary Rehab    Staff Present Lyell Samuel, MS, Exercise Physiologist;Maxon Conetta BS, Exercise Physiologist;Jazsmine Macari, RN, BSN, CCRP;Joseph Hood RCP,RRT,BSRT    Virtual Visit No    Medication changes reported     No    Fall or balance concerns reported    No    Warm-up and Cool-down Performed on first and last piece of equipment    Resistance Training Performed Yes    VAD Patient? No    PAD/SET Patient? No      Pain Assessment   Currently in Pain? No/denies                Social History   Tobacco Use  Smoking Status Former   Current packs/day: 0.00   Types: Cigarettes   Quit date: 1990   Years since quitting: 35.3  Smokeless Tobacco Former   Quit date: 04/24/1988    Goals Met:  Independence with exercise equipment Exercise tolerated well No report of concerns or symptoms today  Goals Unmet:  Not Applicable  Comments: Pt able to follow exercise prescription today without complaint.  Will continue to monitor for progression.    Dr. Firman Hughes is Medical Director for [facility] Cardiac Rehabilitation.  Dr. Fuad Aleskerov is Medical Director for [facility] Pulmonary Rehabilitation.

## 2023-08-17 ENCOUNTER — Encounter: Admitting: *Deleted

## 2023-08-17 DIAGNOSIS — Z951 Presence of aortocoronary bypass graft: Secondary | ICD-10-CM

## 2023-08-17 NOTE — Progress Notes (Signed)
 Daily Session Note  Patient Details  Name: Kirk Russell MRN: [id number] Date of Birth: May 03, 1945 Referring Provider:   Flowsheet Row Cardiac Rehab from 07/11/2023 in [facility] Cardiac and Pulmonary Rehab  Referring Provider Burney Carter, MD       Encounter Date: 08/17/2023  Check In:  Session Check In - 08/17/23 0943       Check-In   Supervising physician immediately available to respond to emergencies See telemetry face sheet for immediately available ER MD    Location [facility]-Cardiac & Pulmonary Rehab    Staff Present Maud Sorenson, RN, BSN, CCRP;Joseph Hood RCP,RRT,BSRT;Maxon [place] BS, Exercise Physiologist;Noah Tickle, BS, Exercise Physiologist    Virtual Visit No    Medication changes reported     No    Fall or balance concerns reported    No    Warm-up and Cool-down Performed on first and last piece of equipment    Resistance Training Performed Yes    VAD Patient? No    PAD/SET Patient? No      Pain Assessment   Currently in Pain? No/denies                Social History   Tobacco Use  Smoking Status Former   Current packs/day: 0.00   Types: Cigarettes   Quit date: 1990   Years since quitting: 35.3  Smokeless Tobacco Former   Quit date: 04/24/1988    Goals Met:  Independence with exercise equipment Exercise tolerated well No report of concerns or symptoms today  Goals Unmet:  Not Applicable  Comments: Pt able to follow exercise prescription today without complaint.  Will continue to monitor for progression.    Short: Use RPE daily to regulate intensity.  Long: Follow program prescription in [facility].    Dr. Firman Hughes is Medical Director for [facility] Cardiac Rehabilitation.  Dr. Fuad Aleskerov is Medical Director for [facility] Pulmonary Rehabilitation.

## 2023-08-20 ENCOUNTER — Encounter: Admitting: *Deleted

## 2023-08-20 DIAGNOSIS — Z951 Presence of aortocoronary bypass graft: Secondary | ICD-10-CM

## 2023-08-20 NOTE — Progress Notes (Signed)
 Daily Session Note  Patient Details  Name: Kirk Russell MRN: [id number] Date of Birth: 11-28-1945 Referring Provider:   Flowsheet Row Cardiac Rehab from 07/11/2023 in [facility] Cardiac and Pulmonary Rehab  Referring Provider Burney Carter, MD       Encounter Date: 08/20/2023  Check In:  Session Check In - 08/20/23 1005       Check-In   Supervising physician immediately available to respond to emergencies See telemetry face sheet for immediately available ER MD    Location [facility]-Cardiac & Pulmonary Rehab    Staff Present Maud Sorenson, RN, BSN, CCRP;Joseph Hood RCP,RRT,BSRT;Kelly [place] BS, ACSM CEP, Exercise Physiologist;Maxon Conetta BS, Exercise Physiologist;Jason Martina Sledge RDN,LDN    Virtual Visit No    Medication changes reported     No    Fall or balance concerns reported    No    Warm-up and Cool-down Performed on first and last piece of equipment    Resistance Training Performed Yes    VAD Patient? No    PAD/SET Patient? No      Pain Assessment   Currently in Pain? No/denies                Social History   Tobacco Use  Smoking Status Former   Current packs/day: 0.00   Types: Cigarettes   Quit date: 1990   Years since quitting: 35.3  Smokeless Tobacco Former   Quit date: 04/24/1988    Goals Met:  Independence with exercise equipment Exercise tolerated well No report of concerns or symptoms today  Goals Unmet:  Not Applicable  Comments: Pt able to follow exercise prescription today without complaint.  Will continue to monitor for progression.    Dr. Firman Hughes is Medical Director for [facility] Cardiac Rehabilitation.  Dr. Fuad Aleskerov is Medical Director for [facility] Pulmonary Rehabilitation.

## 2023-08-22 ENCOUNTER — Encounter: Admitting: *Deleted

## 2023-08-22 DIAGNOSIS — Z951 Presence of aortocoronary bypass graft: Secondary | ICD-10-CM

## 2023-08-22 NOTE — Progress Notes (Signed)
 Daily Session Note  Patient Details  Name: Kirk Russell MRN: [id number] Date of Birth: 10-Jun-1945 Referring Provider:   Flowsheet Row Cardiac Rehab from 07/11/2023 in [facility] Cardiac and Pulmonary Rehab  Referring Provider Burney Carter, MD       Encounter Date: 08/22/2023  Check In:  Session Check In - 08/22/23 0957       Check-In   Supervising physician immediately available to respond to emergencies See telemetry face sheet for immediately available ER MD    Location [facility]-Cardiac & Pulmonary Rehab    Staff Present Maxon Conetta BS, Exercise Physiologist;Noah Tickle, BS, Exercise Physiologist;Wilhelm [place], RN, BSN, CCRP;Joseph Hood RCP,RRT,BSRT    Virtual Visit No    Medication changes reported     No    Fall or balance concerns reported    No    Warm-up and Cool-down Performed on first and last piece of equipment    Resistance Training Performed Yes    VAD Patient? No    PAD/SET Patient? No      Pain Assessment   Currently in Pain? No/denies                Social History   Tobacco Use  Smoking Status Former   Current packs/day: 0.00   Types: Cigarettes   Quit date: 1990   Years since quitting: 35.3  Smokeless Tobacco Former   Quit date: 04/24/1988    Goals Met:  Independence with exercise equipment Exercise tolerated well No report of concerns or symptoms today  Goals Unmet:  Not Applicable  Comments: Pt able to follow exercise prescription today without complaint.  Will continue to monitor for progression.    Dr. Firman Hughes is Medical Director for [facility] Cardiac Rehabilitation.  Dr. Fuad Aleskerov is Medical Director for [facility] Pulmonary Rehabilitation.

## 2023-08-23 ENCOUNTER — Ambulatory Visit: Payer: Medicare Other | Admitting: Urology

## 2023-08-24 ENCOUNTER — Encounter

## 2023-08-27 ENCOUNTER — Encounter: Attending: Internal Medicine | Admitting: *Deleted

## 2023-08-27 DIAGNOSIS — Z951 Presence of aortocoronary bypass graft: Secondary | ICD-10-CM | POA: Insufficient documentation

## 2023-08-27 NOTE — Progress Notes (Signed)
 Daily Session Note  Patient Details  Name: Kirk Russell MRN: [id number] Date of Birth: 14-May-1945 Referring Provider:   Flowsheet Row Cardiac Rehab from 07/11/2023 in [facility] Cardiac and Pulmonary Rehab  Referring Provider Burney Carter, MD       Encounter Date: 08/27/2023  Check In:  Session Check In - 08/27/23 1112       Check-In   Supervising physician immediately available to respond to emergencies See telemetry face sheet for immediately available ER MD    Location [facility]-Cardiac & Pulmonary Rehab    Staff Present Rey Catholic RCP,RRT,BSRT;Alexandera Kuntzman Manson Seitz RN,BSN;Kelly Sabra Cramp BS, ACSM CEP, Exercise Physiologist;Maxon Conetta BS, Exercise Physiologist    Virtual Visit No    Medication changes reported     No    Fall or balance concerns reported    No    Warm-up and Cool-down Performed on first and last piece of equipment    Resistance Training Performed Yes    VAD Patient? No    PAD/SET Patient? No      Pain Assessment   Currently in Pain? No/denies                Social History   Tobacco Use  Smoking Status Former   Current packs/day: 0.00   Types: Cigarettes   Quit date: 1990   Years since quitting: 35.3  Smokeless Tobacco Former   Quit date: 04/24/1988    Goals Met:  Independence with exercise equipment Exercise tolerated well No report of concerns or symptoms today Strength training completed today  Goals Unmet:  Not Applicable  Comments: Pt able to follow exercise prescription today without complaint.  Will continue to monitor for progression.    Dr. Firman Hughes is Medical Director for [facility] Cardiac Rehabilitation.  Dr. Fuad Aleskerov is Medical Director for [facility] Pulmonary Rehabilitation.

## 2023-08-29 ENCOUNTER — Ambulatory Visit: Payer: Self-pay | Admitting: Urology

## 2023-08-29 ENCOUNTER — Encounter: Payer: Self-pay | Admitting: *Deleted

## 2023-08-29 ENCOUNTER — Encounter: Admitting: *Deleted

## 2023-08-29 DIAGNOSIS — Z951 Presence of aortocoronary bypass graft: Secondary | ICD-10-CM | POA: Diagnosis not present

## 2023-08-29 NOTE — Progress Notes (Signed)
 Cardiac Individual Treatment Plan  Patient Details  Name: Kirk Russell MRN: [id number] Date of Birth: April 18, 1946 Referring Provider:   Flowsheet Row Cardiac Rehab from 07/11/2023 in [facility] Cardiac and Pulmonary Rehab  Referring Provider Burney Carter, MD       Initial Encounter Date:  Flowsheet Row Cardiac Rehab from 07/11/2023 in [facility] Cardiac and Pulmonary Rehab  Date 07/11/23       Visit Diagnosis: S/P CABG x 3  Patient's Home Medications on Admission:  Current Outpatient Medications:    albuterol  (VENTOLIN  HFA) 108 (90 Base) MCG/ACT inhaler, Inhale into the lungs every 6 (six) hours as needed for wheezing or shortness of breath., Disp: , Rfl:    alfuzosin  (UROXATRAL ) 10 MG 24 hr tablet, Take 1 tablet (10 mg total) by mouth daily with breakfast., Disp: 90 tablet, Rfl: 3   aspirin  EC 81 MG tablet, Take 81 mg by mouth daily., Disp: , Rfl:    citalopram  (CELEXA ) 10 MG tablet, Take 1 tablet by mouth daily., Disp: , Rfl:    clopidogrel  (PLAVIX ) 75 MG tablet, Take 1 tablet (75 mg total) by mouth daily. (Patient not taking: Reported on 07/05/2023), Disp: 30 tablet, Rfl: 1   dorzolamide -timolol  (COSOPT ) 22.3-6.8 MG/ML ophthalmic solution, 1 drop 2 (two) times daily., Disp: , Rfl:    heparin  25000 UT/250ML infusion, Inject 1,050 Units/hr into the vein continuous., Disp: 1000 mL, Rfl: 0   isosorbide  mononitrate (IMDUR ) 30 MG 24 hr tablet, Take 1 tablet (30 mg total) by mouth daily., Disp: 30 tablet, Rfl: 1   latanoprost  (XALATAN ) 0.005 % ophthalmic solution, 1 drop at bedtime., Disp: , Rfl:    lisinopril  (ZESTRIL ) 2.5 MG tablet, Take 1 tablet (2.5 mg total) by mouth daily. (Patient not taking: Reported on 07/05/2023), Disp: 30 tablet, Rfl: 1   metFORMIN (GLUCOPHAGE) 500 MG tablet, Take 1 tablet by mouth daily with breakfast. (Patient not taking: Reported on 07/05/2023), Disp: , Rfl:    nitroGLYCERIN  (NITROSTAT ) 0.4 MG SL tablet, Place 0.4 mg under the tongue every 5 (five) minutes as  needed for chest pain., Disp: , Rfl:    ranolazine  (RANEXA ) 1000 MG SR tablet, Take 1 tablet (1,000 mg total) by mouth 2 (two) times daily. 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 ears     Tobacco Use: Social History   Tobacco Use  Smoking Status Former   Current packs/day: 0.00   Types: Cigarettes   Quit date: 1990   Years since quitting: 35.3  Smokeless Tobacco Former   Quit date: 04/24/1988    Labs: Review Flowsheet       Latest Ref Rng & Units 12/14/2022 12/24/2022  Labs for ITP Cardiac and Pulmonary Rehab  Cholestrol  0 - 200 mg/dL - 161   LDL (calc) 0 - 99 mg/dL - 75   HDL-C >09 mg/dL - 51   Trlycerides <604 mg/dL - 87   Hemoglobin V4U 4.8 - 5.6 % 6.1  -     Exercise Target Goals: Exercise Program Goal: Individual exercise prescription set using results from initial 6 min walk test and THRR while considering  patient's activity barriers and safety.   Exercise Prescription Goal: Initial exercise prescription builds to 30-45 minutes a day of aerobic activity, 2-3 days per week.  Home exercise guidelines will be given to patient during program as part of exercise prescription that the participant will acknowledge.   Education: Aerobic Exercise: - Group verbal and visual presentation on the components of exercise  prescription. Introduces F.I.T.T principle from ACSM for exercise prescriptions.  Reviews F.I.T.T. principles of aerobic exercise including progression. Written material given at graduation. Flowsheet Row Cardiac Rehab from 08/29/2023 in [facility] Cardiac and Pulmonary Rehab  Education need identified 07/11/23       Education: Resistance Exercise: - Group verbal and visual presentation on the components of exercise prescription. Introduces F.I.T.T principle from ACSM for exercise prescriptions  Reviews F.I.T.T. principles of resistance exercise including progression. Written material given at graduation.    Education: Exercise & Equipment Safety: - Individual verbal instruction and demonstration of equipment use and safety with use of the equipment. Flowsheet Row Cardiac Rehab from 08/29/2023 in [facility] Cardiac and Pulmonary Rehab  Date 07/11/23  Educator MB  Instruction Review Code 1- Verbalizes Understanding       Education: Exercise Physiology & General Exercise Guidelines: - Group verbal and written instruction with models to review the exercise physiology of the cardiovascular system and associated critical values. Provides general exercise guidelines with specific guidelines to those with heart or lung disease.    Education: Flexibility, Balance, Mind/Body Relaxation: - Group verbal and visual presentation with interactive activity on the components of exercise prescription. Introduces F.I.T.T principle from ACSM for exercise prescriptions. Reviews F.I.T.T. principles of flexibility and balance exercise training including progression. Also discusses the mind body connection.  Reviews various relaxation techniques to help reduce and manage stress (i.e. Deep breathing, progressive muscle relaxation, and visualization). Balance handout provided to take home. Written material given at graduation.   Activity Barriers & Risk Stratification:  Activity Barriers & Cardiac Risk Stratification -  07/11/23 1142       Activity Barriers & Cardiac Risk Stratification   Activity Barriers Shortness of Breath;Other (comment)    Comments Neuropathy    Cardiac Risk Stratification High             6 Minute Walk:  6 Minute Walk     Row Name 07/11/23 1141         6 Minute Walk   Phase Initial     Distance 1135 feet     Walk Time 6 minutes     # of Rest Breaks 0     MPH 2.15     METS 2.04     RPE 9     Perceived Dyspnea  0     VO2 Peak 7.14     Symptoms No     Resting HR 67 bpm     Resting BP 110/70     Resting Oxygen Saturation  98 %     Exercise Oxygen Saturation  during 6 min walk 96 %     Max Ex. HR 94 bpm     Max Ex. BP  130/70     2 Minute Post BP 110/72              Oxygen Initial Assessment:   Oxygen Re-Evaluation:   Oxygen Discharge (Final Oxygen Re-Evaluation):   Initial Exercise Prescription:  Initial Exercise Prescription - 07/11/23 1100       Date of Initial Exercise RX and Referring Provider   Date 07/11/23    Referring Provider Burney Carter, MD      Oxygen   Maintain Oxygen Saturation 88% or higher      Treadmill   MPH 1.8    Grade 0    Minutes 15    METs 2.38      Recumbant Bike   Level 2    RPM 50    Watts 15    Minutes 15    METs 2.04      NuStep   Level 2    SPM 80    Minutes 15    METs 2.04      REL-XR   Level 1    Speed 50    Minutes 15    METs 2.04      T5 Nustep   Level 2   T6   SPM 80    Minutes 15    METs 2.04      Prescription Details   Frequency (times per week) 3    Duration Progress to 30 minutes of continuous aerobic without signs/symptoms of physical distress      Intensity   THRR 40-80% of Max Heartrate 97-127    Ratings of Perceived Exertion 11-13    Perceived Dyspnea 0-4      Progression   Progression Continue to progress workloads to maintain intensity without signs/symptoms of physical distress.      Resistance Training   Training Prescription Yes    Weight 7 lb    Reps  10-15             Perform Capillary Blood Glucose checks as needed.  Exercise Prescription Changes:   Exercise Prescription Changes     Row Name 07/11/23 1100 07/25/23 1100 08/09/23 0800 08/23/23 0800       Response to Exercise   Blood Pressure (Admit) 110/70 118/64 122/60 112/70    Blood Pressure (Exercise) 130/70 124/70 150/88 142/82    Blood Pressure (Exit) 110/72 102/68 112/70 100/60    Heart Rate (Admit) 67 bpm 77 bpm 63 bpm 89 bpm    Heart Rate (Exercise) 94 bpm 108 bpm 114 bpm 115 bpm    Heart Rate (Exit) 67 bpm 74 bpm 85 bpm 77 bpm    Oxygen Saturation (Admit) 98 % -- -- --    Oxygen Saturation (Exercise) 96 % -- -- --    Oxygen Saturation (Exit) 98 % -- -- --    Rating of Perceived Exertion (Exercise) 9 13 13 15     Perceived Dyspnea (Exercise) 0 0 -- --    Symptoms none none none none    Comments results first 2 weeks of exercise -- --    Duration Progress to 30 minutes of  aerobic without signs/symptoms of physical distress Progress to 30 minutes of  aerobic without signs/symptoms of physical distress Continue with 30 min of aerobic exercise without signs/symptoms of physical distress. Continue with 30 min of aerobic exercise without signs/symptoms of physical distress.    Intensity THRR New THRR New THRR unchanged THRR unchanged      Progression   Progression Continue to progress  workloads to maintain intensity without signs/symptoms of physical distress. Continue to progress workloads to maintain intensity without signs/symptoms of physical distress. Continue to progress workloads to maintain intensity without signs/symptoms of physical distress. Continue to progress workloads to maintain intensity without signs/symptoms of physical distress.    Average METs 2.04 3.12 2.56 2.82      Resistance Training   Training Prescription -- Yes Yes Yes    Weight -- 7 lb 7 lb 7 lb    Reps -- 10-15 10-15 10-15      Interval Training   Interval Training -- No No No       Treadmill   MPH -- 1.9 1.9 2.4    Grade -- 0 0 0    Minutes -- 15 15 15     METs -- 2.45 2.45 2.84      Recumbant Bike   Level -- -- 6 4    Watts -- -- 33 29    Minutes -- -- 15 15    METs -- -- 3.13 2.98      NuStep   Level -- -- 2 4    Minutes -- -- 15 15    METs -- -- -- 2.9      REL-XR   Level -- 2 3 3     Minutes -- 15 15 15     METs -- 3.9 3.4 3.6      T5 Nustep   Level -- -- 2  T6 1    Minutes -- -- 15 15    METs -- -- 1.5 2.1      Track   Laps -- 42 32 35    Minutes -- 15 15 15     METs -- 3.28 2.74 2.9      Oxygen   Maintain Oxygen Saturation -- 88% or higher 88% or higher 88% or higher             Exercise Comments:   Exercise Comments     Row Name 07/16/23 0956           Exercise Comments First full day of exercise!  Patient was oriented to gym and equipment including functions, settings, policies, and procedures.  Patient's individual exercise prescription and treatment plan were reviewed.  All starting workloads were established based on the results of the 6 minute walk test done at initial orientation visit.  The plan for exercise progression was also introduced and progression will be customized based on patient's performance and goals.                Exercise Goals and Review:   Exercise Goals     Row Name 07/11/23 1147             Exercise Goals   Increase Physical Activity Yes       Intervention Provide advice, education, support and counseling about physical activity/exercise needs.;Develop an individualized exercise prescription for aerobic and resistive training based on initial evaluation findings, risk stratification, comorbidities and participant's personal goals.       Expected Outcomes Short Term: Attend rehab on a regular basis to increase amount of physical activity.;Long Term: Add in home exercise to make exercise part of routine and to increase amount of physical activity.;Long Term: Exercising regularly at least 3-5  days a week.       Increase Strength and Stamina Yes       Intervention Provide advice, education, support and counseling about physical activity/exercise needs.;Develop an individualized exercise prescription for aerobic  and resistive training based on initial evaluation findings, risk stratification, comorbidities and participant's personal goals.       Expected Outcomes Short Term: Increase workloads from initial exercise prescription for resistance, speed, and METs.;Short Term: Perform resistance training exercises routinely during rehab and add in resistance training at home;Long Term: Improve cardiorespiratory fitness, muscular endurance and strength as measured by increased METs and functional capacity ( )       Able to understand and use rate of perceived exertion (RPE) scale Yes       Intervention Provide education and explanation on how to use RPE scale       Expected Outcomes Short Term: Able to use RPE daily in rehab to express subjective intensity level;Long Term:  Able to use RPE to guide intensity level when exercising independently       Able to understand and use Dyspnea scale Yes       Intervention Provide education and explanation on how to use Dyspnea scale       Expected Outcomes Short Term: Able to use Dyspnea scale daily in rehab to express subjective sense of shortness of breath during exertion;Long Term: Able to use Dyspnea scale to guide intensity level when exercising independently       Knowledge and understanding of Target Heart Rate Range (THRR) Yes       Intervention Provide education and explanation of THRR including how the numbers were predicted and where they are located for reference       Expected Outcomes Short Term: Able to state/look up THRR;Short Term: Able to use daily as guideline for intensity in rehab;Long Term: Able to use THRR to govern intensity when exercising independently       Able to check pulse independently Yes       Intervention Provide  education and demonstration on how to check pulse in carotid and radial arteries.;Review the importance of being able to check your own pulse for safety during independent exercise       Expected Outcomes Short Term: Able to explain why pulse checking is important during independent exercise;Long Term: Able to check pulse independently and accurately       Understanding of Exercise Prescription Yes       Intervention Provide education, explanation, and written materials on patient's individual exercise prescription       Expected Outcomes Short Term: Able to explain program exercise prescription;Long Term: Able to explain home exercise prescription to exercise independently                Exercise Goals Re-Evaluation :  Exercise Goals Re-Evaluation     Row Name 07/16/23 [phone] 07/25/23 1147 08/09/23 0812 08/23/23 0858       Exercise Goal Re-Evaluation   Exercise Goals Review Able to understand and use rate of perceived exertion (RPE) scale;Able to understand and use Dyspnea scale;Knowledge and understanding of Target Heart Rate Range (THRR);Understanding of Exercise Prescription Increase Physical Activity;Increase Strength and Stamina;Understanding of Exercise Prescription Increase Physical Activity;Increase Strength and Stamina;Understanding of Exercise Prescription Increase Physical Activity;Increase Strength and Stamina;Understanding of Exercise Prescription    Comments Reviewed RPE and dyspnea scale, THR and program prescription with pt today.  Pt voiced understanding and was given a copy of goals to take home. Raenette Bumps is off to a good start in the program. He was able to use the Treadmill, XR, and track during his first 3 sessions. He was able to increase to a speed of 1. on the treadmill, and increase to level  2 on the XR. We will continue to monitor his progress in the program. Nadean August is doing well in rehab. He was able to increase to level 6 on the recumbent bike from level 3 and increased  to level 3 on the XR. He maintained level 2 on the T4 and T6 nusteps. He also maintained his workload on the treadmill with a speed of 1.9 mph with no incline. He decreased his number of laps on the track to 32 from 42. We will continue to monitor his progress in the program. Nadean August continues to do well in rehab. He was able to increase his speed on the treadmill from 1.9 to 2.14mph. He was also able to increase from level 2 to 4 on the T4 nustep. We will continue to monitor his progress in the program.    Expected Outcomes Short: Use RPE daily to regulate intensity. Long: Follow program prescription in THR. Short: Continue to increase treadmill workload. Long: Continue exercise to improve strength and stamina. Short: Try to increase back to 42 laps on the track. Long: Continue exercise to improve strength and stamina. Short: Continue to increase treadmill wokrloads. Long: Continue exercise to improve strength and stamina.             Discharge Exercise Prescription (Final Exercise Prescription Changes):  Exercise Prescription Changes - 08/23/23 0800       Response to Exercise   Blood Pressure (Admit) 112/70    Blood Pressure (Exercise) 142/82    Blood Pressure (Exit) 100/60    Heart Rate (Admit) 89 bpm    Heart Rate (Exercise) 115 bpm    Heart Rate (Exit) 77 bpm    Rating of Perceived Exertion (Exercise) 15    Symptoms none    Duration Continue with 30 min of aerobic exercise without signs/symptoms of physical distress.    Intensity THRR unchanged      Progression   Progression Continue to progress workloads to maintain intensity without signs/symptoms of physical distress.    Average METs 2.82      Resistance Training   Training Prescription Yes    Weight 7 lb    Reps 10-15      Interval Training   Interval Training No      Treadmill   MPH 2.4    Grade 0    Minutes 15    METs 2.84      Recumbant Bike   Level 4    Watts 29    Minutes 15    METs 2.98      NuStep    Level 4    Minutes 15    METs 2.9      REL-XR   Level 3    Minutes 15    METs 3.6      T5 Nustep   Level 1    Minutes 15    METs 2.1      Track   Laps 35    Minutes 15    METs 2.9      Oxygen   Maintain Oxygen Saturation 88% or higher             Nutrition:  Target Goals: Understanding of nutrition guidelines, daily intake of sodium 1500mg , cholesterol 200mg , calories 30% from fat and 7% or less from saturated fats, daily to have 5 or more servings of fruits and vegetables.  Education: All About Nutrition: -Group instruction provided by verbal, written material, interactive activities, discussions, models, and posters to present general guidelines  for heart healthy nutrition including fat, fiber, MyPlate, the role of sodium in heart healthy nutrition, utilization of the nutrition label, and utilization of this knowledge for meal planning. Follow up email sent as well. Written material given at graduation. Flowsheet Row Cardiac Rehab from 08/29/2023 in [facility] Cardiac and Pulmonary Rehab  Education need identified 07/11/23  Date 07/18/23  Educator JG  Instruction Review Code 1- Verbalizes Understanding       Biometrics:  Pre Biometrics - 07/11/23 1149       Pre Biometrics   Height 5' 8.9" (1.75 m)    Weight 202 lb 12.8 oz (92 kg)    Waist Circumference 43 inches    Hip Circumference 44.5 inches    Waist to Hip Ratio 0.97 %    BMI (Calculated) 30.04    Single Leg Stand 3 seconds              Nutrition Therapy Plan and Nutrition Goals:  Nutrition Therapy & Goals - 07/11/23 1153       Nutrition Therapy   RD appointment deferred Yes      Personal Nutrition Goals   Nutrition Goal RD appointment deferred at this time      Intervention Plan   Intervention Prescribe, educate and counsel regarding individualized specific dietary modifications aiming towards targeted core components such as weight, hypertension, lipid management, diabetes, heart failure and  other comorbidities.    Expected Outcomes Short Term Goal: Understand basic principles of dietary content, such as calories, fat, sodium, cholesterol and nutrients.             Nutrition Assessments:  MEDIFICTS Score Key: >=70 Need to make dietary changes  40-70 Heart Healthy Diet <= 40 Therapeutic Level Cholesterol Diet  Flowsheet Row Cardiac Rehab from 07/11/2023 in [facility] Cardiac and Pulmonary Rehab  Picture Your Plate Total Score on Admission 36      Picture Your Plate Scores: <16 Unhealthy dietary pattern with much room for improvement. 41-50 Dietary pattern unlikely to meet recommendations for good health and room for improvement. 51-60 More healthful dietary pattern, with some room for improvement.  >60 Healthy dietary pattern, although there may be some specific behaviors that could be improved.    Nutrition Goals Re-Evaluation:  Nutrition Goals Re-Evaluation     Row Name 08/22/23 0932             Goals   Nutrition Goal RD appointment deferred at this time                Nutrition Goals Discharge (Final Nutrition Goals Re-Evaluation):  Nutrition Goals Re-Evaluation - 08/22/23 0932       Goals   Nutrition Goal RD appointment deferred at this time             Psychosocial: Target Goals: Acknowledge presence or absence of significant depression and/or stress, maximize coping skills, provide positive support system. Participant is able to verbalize types and ability to use techniques and skills needed for reducing stress and depression.   Education: Stress, Anxiety, and Depression - Group verbal and visual presentation to define topics covered.  Reviews how body is impacted by stress, anxiety, and depression.  Also discusses healthy ways to reduce stress and to treat/manage anxiety and depression.  Written material given at graduation. Flowsheet Row Cardiac Rehab from 08/29/2023 in [facility] Cardiac and Pulmonary Rehab  Date 08/29/23  Educator [facility]   Instruction Review Code 1- [employer] Understanding       Education: Sleep Hygiene -Provides group  verbal and written instruction about how sleep can affect your health.  Define sleep hygiene, discuss sleep cycles and impact of sleep habits. Review good sleep hygiene tips.    Initial Review & Psychosocial Screening:  Initial Psych Review & Screening - 07/05/23 1341       Initial Review   Current issues with Current Stress Concerns;Current Anxiety/Panic    Source of Stress Concerns Unable to perform yard/household activities    Comments Aniety over managing a large estate for sister,   cant get out golfing yet.      Family Dynamics   Good Support System? Yes   wife     Barriers   Psychosocial barriers to participate in program There are no identifiable barriers or psychosocial needs.      Screening Interventions   Interventions To provide support and resources with identified psychosocial needs;Provide feedback about the scores to participant;Encouraged to exercise    Expected Outcomes Short Term goal: Utilizing psychosocial counselor, staff and physician to assist with identification of specific Stressors or current issues interfering with healing process. Setting desired goal for each stressor or current issue identified.;Long Term Goal: Stressors or current issues are controlled or eliminated.;Short Term goal: Identification and review with participant of any Quality of Life or Depression concerns found by scoring the questionnaire.;Long Term goal: The participant improves quality of Life and PHQ9 Scores as seen by post scores and/or verbalization of changes             Quality of Life Scores:   Quality of Life - 07/11/23 1150       Quality of Life   Select Quality of Life      Quality of Life Scores   Health/Function Pre 17.43 %    Socioeconomic Pre 23.83 %    Psych/Spiritual Pre 22.71 %    Family Pre 24.6 %    GLOBAL Pre 20.8 %            Scores of 19 and  below usually indicate a poorer quality of life in these areas.  A difference of  2-3 points is a clinically meaningful difference.  A difference of 2-3 points in the total score of the Quality of Life Index has been associated with significant improvement in overall quality of life, self-image, physical symptoms, and general health in studies assessing change in quality of life.  PHQ-9: Review Flowsheet       07/11/2023  Depression screen PHQ 2/9  Decreased Interest 1  Down, Depressed, Hopeless 0  PHQ - 2 Score 1  Altered sleeping 0  Tired, decreased energy 1  Change in appetite 0  Feeling bad or failure about yourself  0  Trouble concentrating 2  Moving slowly or fidgety/restless 0  Suicidal thoughts 0  PHQ-9 Score 4  Difficult doing work/chores Not difficult at all   Interpretation of Total Score  Total Score Depression Severity:  1-4 = Minimal depression, 5-9 = Mild depression, 10-14 = Moderate depression, 15-19 = Moderately severe depression, 20-27 = Severe depression   Psychosocial Evaluation and Intervention:  Psychosocial Evaluation - 07/05/23 1357       Psychosocial Evaluation & Interventions   Interventions Encouraged to exercise with the program and follow exercise prescription    Comments There are no barriers to his starting the program.  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  He lives with  his wife and she is his support.  He controls his diabetes with diet and exercise.  He would like to drop 15 pounds. He is rady to strt the program    Expected Outcomes STG attend all scheduled sessions, meet with the RD and work on exercise progression as advised by EP.   LTG aintain exercise progression and get back on the golf course.    Continue Psychosocial Services  Follow up required by staff             Psychosocial Re-Evaluation:  Psychosocial Re-Evaluation     Row Name  08/22/23 [phone]             Psychosocial Re-Evaluation   Current issues with Current Anxiety/Panic;Current Psychotropic Meds       Comments Raenette Bumps takes some medication for anxiety. He feels when he gets out of his house it improves his mood. He has not had a history of depression and states that his mood change is new if he is in the house for to long.       Expected Outcomes Short: Continue to attend [facility] regularly for regular exercise and social engagement. Long: Continue to improve symptoms and manage a positive mental state.       Interventions Encouraged to attend Cardiac Rehabilitation for the exercise       Continue Psychosocial Services  Follow up required by staff                Psychosocial Discharge (Final Psychosocial Re-Evaluation):  Psychosocial Re-Evaluation - 08/22/23 0933       Psychosocial Re-Evaluation   Current issues with Current Anxiety/Panic;Current Psychotropic Meds    Comments Raenette Bumps takes some medication for anxiety. He feels when he gets out of his house it improves his mood. He has not had a history of depression and states that his mood change is new if he is in the house for to long.    Expected Outcomes Short: Continue to attend [facility] regularly for regular exercise and social engagement. Long: Continue to improve symptoms and manage a positive mental state.    Interventions Encouraged to attend Cardiac Rehabilitation for the exercise    Continue Psychosocial Services  Follow up required by staff             Vocational Rehabilitation: Provide vocational rehab assistance to qualifying candidates.   Vocational Rehab Evaluation & Intervention:   Education: Education Goals: Education classes will be provided on a variety of topics geared toward better understanding of heart health and risk factor modification. Participant will state understanding/return demonstration of topics presented as noted by education test scores.  Learning  Barriers/Preferences:  Learning Barriers/Preferences - 07/05/23 1346       Learning Barriers/Preferences   Learning Barriers Hearing    Learning Preferences None             General Cardiac Education Topics:  AED/CPR: - Group verbal and written instruction with the use of models to demonstrate the basic use of the AED with the basic ABC's of resuscitation.   Anatomy and Cardiac Procedures: - Group verbal and visual presentation and models provide information about basic cardiac anatomy and function. Reviews the testing methods done to diagnose heart disease and the outcomes of the test results. Describes the treatment choices: Medical Management, Angioplasty, or Coronary Bypass Surgery for treating various heart conditions including Myocardial Infarction, Angina, Valve Disease, and Cardiac Arrhythmias.  Written material given at graduation. Flowsheet Row Cardiac Rehab from 08/29/2023 in [facility]  Cardiac and Pulmonary Rehab  Education need identified 07/11/23       Medication Safety: - Group verbal and visual instruction to review commonly prescribed medications for heart and lung disease. Reviews the medication, class of the drug, and side effects. Includes the steps to properly store meds and maintain the prescription regimen.  Written material given at graduation.   Intimacy: - Group verbal instruction through game format to discuss how heart and lung disease can affect sexual intimacy. Written material given at graduation..   Know Your Numbers and Heart Failure: - Group verbal and visual instruction to discuss disease risk factors for cardiac and pulmonary disease and treatment options.  Reviews associated critical values for Overweight/Obesity, Hypertension, Cholesterol, and Diabetes.  Discusses basics of heart failure: signs/symptoms and treatments.  Introduces Heart Failure Zone chart for action plan for heart failure.  Written material given at graduation. Flowsheet Row Cardiac  Rehab from 08/29/2023 in [facility] Cardiac and Pulmonary Rehab  Education need identified 07/11/23       Infection Prevention: - Provides verbal and written material to individual with discussion of infection control including proper hand washing and proper equipment cleaning during exercise session. Flowsheet Row Cardiac Rehab from 08/29/2023 in [facility] Cardiac and Pulmonary Rehab  Date 07/11/23  Educator MB  Instruction Review Code 1- Verbalizes Understanding       Falls Prevention: - Provides verbal and written material to individual with discussion of falls prevention and safety. Flowsheet Row Cardiac Rehab from 08/29/2023 in [facility] Cardiac and Pulmonary Rehab  Date 07/11/23  Educator MB  Instruction Review Code 1- Verbalizes Understanding       Other: -Provides group and verbal instruction on various topics (see comments)   Knowledge Questionnaire Score:  Knowledge Questionnaire Score - 07/11/23 1154       Knowledge Questionnaire Score   Pre Score 21/26             Core Components/Risk Factors/Patient Goals at Admission:  Personal Goals and Risk Factors at Admission - 07/11/23 1154       Core Components/Risk Factors/Patient Goals on Admission    Weight Management Yes    Intervention Weight Management: Develop a combined nutrition and exercise program designed to reach desired caloric intake, while maintaining appropriate intake of nutrient and fiber, sodium and fats, and appropriate energy expenditure required for the weight goal.    Admit Weight 202 lb 12.8 oz (92 kg)    Goal Weight: Short Term 198 lb (89.8 kg)    Goal Weight: Long Term 185 lb (83.9 kg)    Expected Outcomes Short Term: Continue to assess and modify interventions until short term weight is achieved;Long Term: Adherence to nutrition and physical activity/exercise program aimed toward attainment of established weight goal;Weight Loss: Understanding of general recommendations for a balanced deficit meal plan,  which promotes 1-2 lb weight loss per week and includes a negative energy balance of [phone] kcal/d;Understanding recommendations for meals to include 15-35% energy as protein, 25-35% energy from fat, 35-60% energy from carbohydrates, less than 200mg  of dietary cholesterol, 20-35 gm of total fiber daily;Understanding of distribution of calorie intake throughout the day with the consumption of 4-5 meals/snacks    Improve shortness of breath with ADL's Yes    Intervention Provide education, individualized exercise plan and daily activity instruction to help decrease symptoms of SOB with activities of daily living.    Expected Outcomes Short Term: Improve cardiorespiratory fitness to achieve a reduction of symptoms when performing ADLs;Long Term: Be able to  perform more ADLs without symptoms or delay the onset of symptoms    Diabetes Yes   Diet-controlled   Intervention Provide education about signs/symptoms and action to take for hypo/hyperglycemia.;Provide education about proper nutrition, including hydration, and aerobic/resistive exercise prescription along with prescribed medications to achieve blood glucose in normal ranges: Fasting glucose 65-99 mg/dL    Expected Outcomes Short Term: Participant verbalizes understanding of the signs/symptoms and immediate care of hyper/hypoglycemia, proper foot care and importance of medication, aerobic/resistive exercise and nutrition plan for blood glucose control.;Long Term: Attainment of HbA1C < 7%.    Hypertension Yes    Intervention Provide education on lifestyle modifcations including regular physical activity/exercise, weight management, moderate sodium restriction and increased consumption of fresh fruit, vegetables, and low fat dairy, alcohol moderation, and smoking cessation.;Monitor prescription use compliance.    Expected Outcomes Short Term: Continued assessment and intervention until BP is < 140/30mm HG in hypertensive participants. < 130/55mm HG in  hypertensive participants with diabetes, heart failure or chronic kidney disease.;Long Term: Maintenance of blood pressure at goal levels.    Lipids Yes    Intervention Provide education and support for participant on nutrition & aerobic/resistive exercise along with prescribed medications to achieve LDL 70mg , HDL >40mg .    Expected Outcomes Short Term: Participant states understanding of desired cholesterol values and is compliant with medications prescribed. Participant is following exercise prescription and nutrition guidelines.;Long Term: Cholesterol controlled with medications as prescribed, with individualized exercise RX and with personalized nutrition plan. Value goals: LDL < 70mg , HDL > 40 mg.             Education:Diabetes - Individual verbal and written instruction to review signs/symptoms of diabetes, desired ranges of glucose level fasting, after meals and with exercise. Acknowledge that pre and post exercise glucose checks will be done for 3 sessions at entry of program. Flowsheet Row Cardiac Rehab from 08/29/2023 in [facility] Cardiac and Pulmonary Rehab  Date 07/11/23  Educator MB  Instruction Review Code 1- Verbalizes Understanding       Core Components/Risk Factors/Patient Goals Review:   Goals and Risk Factor Review     Row Name 08/22/23 [phone]             Core Components/Risk Factors/Patient Goals Review   Personal Goals Review Diabetes       Review Bill states that his A1C is doing well and is checking his blood glucose at home. He has a beer every now and then oppsed to 3 a day. He has been making changes in his diet to help with his sugar. His morning blood sugar on average is 112.       Expected Outcomes Short: continue checking blood sugars. Long: reduce A1C.                Core Components/Risk Factors/Patient Goals at Discharge (Final Review):   Goals and Risk Factor Review - 08/22/23 0937       Core Components/Risk Factors/Patient Goals Review    Personal Goals Review Diabetes    Review Bill states that his A1C is doing well and is checking his blood glucose at home. He has a beer every now and then oppsed to 3 a day. He has been making changes in his diet to help with his sugar. His morning blood sugar on average is 112.    Expected Outcomes Short: continue checking blood sugars. Long: reduce A1C.             ITP Comments:  ITP Comments  Row Name 07/05/23 1357 07/11/23 1141 07/16/23 0956 08/01/23 0737 08/29/23 1255   ITP Comments Virtual orientation call completed today. he has an appointment on Date: 07/11/2023  for EP eval and gym Orientation.  Documentation of diagnosis can be found in [facility] 06/27/2023 . Completed and gym orientation. Initial ITP created and sent for review to Dr. Firman Hughes, Medical Director. First full day of exercise!  Patient was oriented to gym and equipment including functions, settings, policies, and procedures.  Patient's individual exercise prescription and treatment plan were reviewed.  All starting workloads were established based on the results of the 6 minute walk test done at initial orientation visit.  The plan for exercise progression was also introduced and progression will be customized based on patient's performance and goals. 30 Day review completed. Medical Director ITP review done, changes made as directed, and signed approval by Medical Director.    new to program 30 Day review completed. Medical Director ITP review done, changes made as directed, and signed approval by Medical Director.            Comments:

## 2023-08-29 NOTE — Progress Notes (Signed)
 Daily Session Note  Patient Details  Name: Kirk Russell MRN: [id number] Date of Birth: [birth year] Referring Provider:   Flowsheet Row Cardiac Rehab from 07/11/2023 in [facility] Cardiac and Pulmonary Rehab  Referring Provider Burney Carter, MD       Encounter Date: 08/29/2023  Check In:  Session Check In - 08/29/23 4098       Check-In   Supervising physician immediately available to respond to emergencies See telemetry face sheet for immediately available ER MD    Location [facility]-Cardiac & Pulmonary Rehab    Staff Present Maud Sorenson, RN, BSN, CCRP;Joseph Hood RCP,RRT,BSRT;Maxon [place] BS, Exercise Physiologist;Laureen Bevin [place], BS, RRT, CPFT;Meredith Manson Seitz RN,BSN    Virtual Visit No    Medication changes reported     No    Fall or balance concerns reported    No    Warm-up and Cool-down Performed on first and last piece of equipment    Resistance Training Performed Yes    VAD Patient? No    PAD/SET Patient? No      Pain Assessment   Currently in Pain? No/denies                Social History   Tobacco Use  Smoking Status Former   Current packs/day: 0.00   Types: Cigarettes   Quit date: 1990   Years since quitting: 35.3  Smokeless Tobacco Former   Quit date: 04/24/1988    Goals Met:  Independence with exercise equipment Exercise tolerated well No report of concerns or symptoms today  Goals Unmet:  Not Applicable  Comments: Pt able to follow exercise prescription today without complaint.  Will continue to monitor for progression.    Dr. Firman Hughes is Medical Director for [facility] Cardiac Rehabilitation.  Dr. Fuad Aleskerov is Medical Director for [facility] Pulmonary Rehabilitation.

## 2023-08-31 ENCOUNTER — Encounter

## 2023-09-03 ENCOUNTER — Encounter: Admitting: *Deleted

## 2023-09-03 DIAGNOSIS — Z951 Presence of aortocoronary bypass graft: Secondary | ICD-10-CM

## 2023-09-03 NOTE — Progress Notes (Signed)
 Daily Session Note  Patient Details  Name: Traci Kingsbury MRN: [id number] Date of Birth: [birth year] Referring Provider:   Flowsheet Row Cardiac Rehab from 07/11/2023 in [facility] Cardiac and Pulmonary Rehab  Referring Provider Burney Carter, MD       Encounter Date: 09/03/2023  Check In:  Session Check In - 09/03/23 1018       Check-In   Supervising physician immediately available to respond to emergencies See telemetry face sheet for immediately available ER MD    Location [facility]-Cardiac & Pulmonary Rehab    Staff Present Maud Sorenson, RN, BSN, CCRP;Joseph Hood RCP,RRT,BSRT;Maxon [employer], Exercise Physiologist;Kelly [employer], ACSM CEP, Exercise Physiologist    Virtual Visit No    Medication changes reported     No    Fall or balance concerns reported    No    Warm-up and Cool-down Performed on first and last piece of equipment    Resistance Training Performed Yes    VAD Patient? No    PAD/SET Patient? No      Pain Assessment   Currently in Pain? No/denies                Social History   Tobacco Use  Smoking Status Former   Current packs/day: 0.00   Types: Cigarettes   Quit date: 1990   Years since quitting: 35.3  Smokeless Tobacco Former   Quit date: 04/24/1988    Goals Met:  Independence with exercise equipment Exercise tolerated well No report of concerns or symptoms today  Goals Unmet:  Not Applicable  Comments: Pt able to follow exercise prescription today without complaint.  Will continue to monitor for progression.    Dr. Firman Hughes is Medical Director for [facility] Cardiac Rehabilitation.  Dr. Fuad Aleskerov is Medical Director for [facility] Pulmonary Rehabilitation.

## 2023-09-05 ENCOUNTER — Encounter: Admitting: *Deleted

## 2023-09-05 DIAGNOSIS — Z951 Presence of aortocoronary bypass graft: Secondary | ICD-10-CM

## 2023-09-05 NOTE — Progress Notes (Signed)
 Daily Session Note  Patient Details  Name: Kirk Russell MRN: [id number] Date of Birth: 1945/05/04 Referring Provider:   Flowsheet Row Cardiac Rehab from 07/11/2023 in [facility] Cardiac and Pulmonary Rehab  Referring Provider Burney Carter, MD       Encounter Date: 09/05/2023  Check In:  Session Check In - 09/05/23 0956       Check-In   Supervising physician immediately available to respond to emergencies See telemetry face sheet for immediately available ER MD    Location [facility]-Cardiac & Pulmonary Rehab    Staff Present Maud Sorenson, RN, BSN, CCRP;Joseph Hood RCP,RRT,BSRT;Maxon [place] BS, Exercise Physiologist;Noah Tickle, BS, Exercise Physiologist    Virtual Visit No    Medication changes reported     No    Fall or balance concerns reported    No    Warm-up and Cool-down Performed on first and last piece of equipment    Resistance Training Performed Yes    VAD Patient? No    PAD/SET Patient? No      Pain Assessment   Currently in Pain? No/denies                Social History   Tobacco Use  Smoking Status Former   Current packs/day: 0.00   Types: Cigarettes   Quit date: 1990   Years since quitting: 35.3  Smokeless Tobacco Former   Quit date: 04/24/1988    Goals Met:  Independence with exercise equipment Exercise tolerated well No report of concerns or symptoms today  Goals Unmet:  Not Applicable  Comments: Pt able to follow exercise prescription today without complaint.  Will continue to monitor for progression.    Dr. Firman Hughes is Medical Director for [facility] Cardiac Rehabilitation.  Dr. Fuad Aleskerov is Medical Director for [facility] Pulmonary Rehabilitation.

## 2023-09-07 ENCOUNTER — Encounter: Admitting: *Deleted

## 2023-09-07 DIAGNOSIS — Z951 Presence of aortocoronary bypass graft: Secondary | ICD-10-CM

## 2023-09-07 NOTE — Progress Notes (Signed)
 Daily Session Note  Patient Details  Name: Kirk Russell MRN: [id number] Date of Birth: [birth year] Referring Provider:   Flowsheet Row Cardiac Rehab from 07/11/2023 in [facility] Cardiac and Pulmonary Rehab  Referring Provider Burney Carter, MD       Encounter Date: 09/07/2023  Check In:  Session Check In - 09/07/23 0948       Check-In   Supervising physician immediately available to respond to emergencies See telemetry face sheet for immediately available ER MD    Location [facility]-Cardiac & Pulmonary Rehab    Staff Present Maud Sorenson, RN, BSN, CCRP;Joseph Hood RCP,RRT,BSRT;Maxon [place] BS, Exercise Physiologist;Noah Tickle, BS, Exercise Physiologist    Virtual Visit No    Medication changes reported     No    Fall or balance concerns reported    No    Warm-up and Cool-down Performed on first and last piece of equipment    Resistance Training Performed Yes    VAD Patient? No    PAD/SET Patient? No      Pain Assessment   Currently in Pain? No/denies                Social History   Tobacco Use  Smoking Status Former   Current packs/day: 0.00   Types: Cigarettes   Quit date: 1990   Years since quitting: 35.3  Smokeless Tobacco Former   Quit date: 04/24/1988    Goals Met:  Independence with exercise equipment Exercise tolerated well No report of concerns or symptoms today  Goals Unmet:  Not Applicable  Comments: Pt able to follow exercise prescription today without complaint.  Will continue to monitor for progression.    Dr. Firman Hughes is Medical Director for [facility] Cardiac Rehabilitation.  Dr. Fuad Aleskerov is Medical Director for [facility] Pulmonary Rehabilitation.

## 2023-09-10 ENCOUNTER — Encounter: Admitting: *Deleted

## 2023-09-10 DIAGNOSIS — Z951 Presence of aortocoronary bypass graft: Secondary | ICD-10-CM

## 2023-09-10 NOTE — Progress Notes (Signed)
 Daily Session Note  Patient Details  Name: Kirk Russell MRN: [id number] Date of Birth: 1945/12/23 Referring Provider:   Flowsheet Row Cardiac Rehab from 07/11/2023 in [facility] Cardiac and Pulmonary Rehab  Referring Provider Burney Carter, MD       Encounter Date: 09/10/2023  Check In:  Session Check In - 09/10/23 0931       Check-In   Supervising physician immediately available to respond to emergencies See telemetry face sheet for immediately available ER MD    Location [facility]-Cardiac & Pulmonary Rehab    Staff Present Maud Sorenson, RN, BSN, CCRP;Maxon [place] BS, Exercise Physiologist;Kelly [employer], ACSM CEP, Exercise Physiologist    Virtual Visit No    Medication changes reported     No    Fall or balance concerns reported    No    Warm-up and Cool-down Performed on first and last piece of equipment    Resistance Training Performed Yes    VAD Patient? No    PAD/SET Patient? No      Pain Assessment   Currently in Pain? No/denies                Social History   Tobacco Use  Smoking Status Former   Current packs/day: 0.00   Types: Cigarettes   Quit date: 1990   Years since quitting: 35.4  Smokeless Tobacco Former   Quit date: 04/24/1988    Goals Met:  Independence with exercise equipment Exercise tolerated well No report of concerns or symptoms today  Goals Unmet:  Not Applicable  Comments: Pt able to follow exercise prescription today without complaint.  Will continue to monitor for progression.    Dr. Firman Hughes is Medical Director for [facility] Cardiac Rehabilitation.  Dr. Fuad Aleskerov is Medical Director for [facility] Pulmonary Rehabilitation.

## 2023-09-12 ENCOUNTER — Encounter

## 2023-09-14 ENCOUNTER — Encounter: Admitting: *Deleted

## 2023-09-14 DIAGNOSIS — Z951 Presence of aortocoronary bypass graft: Secondary | ICD-10-CM | POA: Diagnosis not present

## 2023-09-14 NOTE — Progress Notes (Signed)
 Daily Session Note  Patient Details  Name: Kirk Russell MRN: [id number] Date of Birth: [birth year] Referring Provider:   Flowsheet Row Cardiac Rehab from 07/11/2023 in [facility] Cardiac and Pulmonary Rehab  Referring Provider Burney Carter, MD       Encounter Date: 09/14/2023  Check In:  Session Check In - 09/14/23 0944       Check-In   Supervising physician immediately available to respond to emergencies See telemetry face sheet for immediately available ER MD    Location [facility]-Cardiac & Pulmonary Rehab    Staff Present Maud Sorenson, RN, BSN, CCRP;Joseph Hood RCP,RRT,BSRT;Maxon [place] BS, Exercise Physiologist;Noah Tickle, BS, Exercise Physiologist    Virtual Visit No    Medication changes reported     No    Fall or balance concerns reported    No    Warm-up and Cool-down Performed on first and last piece of equipment    Resistance Training Performed Yes    VAD Patient? No    PAD/SET Patient? No      Pain Assessment   Currently in Pain? No/denies                Social History   Tobacco Use  Smoking Status Former   Current packs/day: 0.00   Types: Cigarettes   Quit date: 1990   Years since quitting: 35.4  Smokeless Tobacco Former   Quit date: 04/24/1988    Goals Met:  Independence with exercise equipment Exercise tolerated well No report of concerns or symptoms today  Goals Unmet:  Not Applicable  Comments: Pt able to follow exercise prescription today without complaint.  Will continue to monitor for progression.    Dr. Firman Hughes is Medical Director for [facility] Cardiac Rehabilitation.  Dr. Fuad Aleskerov is Medical Director for [facility] Pulmonary Rehabilitation.

## 2023-09-19 ENCOUNTER — Encounter: Admitting: *Deleted

## 2023-09-19 DIAGNOSIS — Z951 Presence of aortocoronary bypass graft: Secondary | ICD-10-CM | POA: Diagnosis not present

## 2023-09-19 NOTE — Progress Notes (Signed)
 Daily Session Note  Patient Details  Name: Kirk Russell MRN: [id number] Date of Birth: 12-17-1945 Referring Provider:   Flowsheet Row Cardiac Rehab from 07/11/2023 in [facility] Cardiac [facility] Pul[facility] Re[facility]  Referring Provider Burney Carter, MD       Encounter Date: 09/19/2023  Check In:  Session Check In - 09/19/23 0950       Check-In   Supervising physician immediately available to respond to emergencies See telemetry face sheet for immediately available ER MD    Location [facility]-Cardiac & Pulmonary Rehab    Staff Present Maud Sorenson, RN, BSN, CCRP;Joseph Hood RCP,RRT,BSRT;Maxon [place] BS, Exercise Physiologist;Noah Tickle, BS, Exercise Physiologist    Virtual Visit No    Medication changes reported     No    Fall or balance concerns reported    No    Warm-up and Cool-down Performed on first and last piece of equipment    Resistance Training Performed Yes    VAD Patient? No    PAD/SET Patient? No      Pain Assessment   Currently in Pain? No/denies                Social History   Tobacco Use  Smoking Status Former   Current packs/day: 0.00   Types: Cigarettes   Quit date: 1990   Years since quitting: 35.4  Smokeless Tobacco Former   Quit date: 04/24/1988    Goals Met:  Independence with exercise equipment Exercise tolerated well No report of concerns or symptoms today  Goals Unmet:  Not Applicable  Comments: Pt able to follow exercise prescription today without complaint.  Will continue to monitor for progression.    Dr. Firman Hughes is Medical Director for [facility] Cardiac Rehabilitation.  Dr. Fuad Aleskerov is Medical Director for [facility] Pulmonary Rehabilitation.

## 2023-09-21 ENCOUNTER — Encounter: Admitting: *Deleted

## 2023-09-21 DIAGNOSIS — Z951 Presence of aortocoronary bypass graft: Secondary | ICD-10-CM

## 2023-09-21 NOTE — Progress Notes (Signed)
 Daily Session Note  Patient Details  Name: Kirk Russell MRN: [id number] Date of Birth: [birth year] Referring Provider:   Flowsheet Row Cardiac Rehab from 07/11/2023 in [facility] Cardiac and Pulmonary Rehab  Referring Provider Burney Carter, MD       Encounter Date: 09/21/2023  Check In:  Session Check In - 09/21/23 0936       Check-In   Supervising physician immediately available to respond to emergencies See telemetry face sheet for immediately available ER MD    Location [facility]-Cardiac & Pulmonary Rehab    Staff Present Maxon [place] BS, Exercise Physiologist;Noah Tickle, BS, Exercise Physiologist;Esperanza Madrazo, RN, BSN, CCRP;Joseph Hood RCP,RRT,BSRT    Virtual Visit No    Medication changes reported     No    Fall or balance concerns reported    No    Warm-up and Cool-down Performed on first and last piece of equipment    Resistance Training Performed Yes    VAD Patient? No    PAD/SET Patient? No      Pain Assessment   Currently in Pain? No/denies                Social History   Tobacco Use  Smoking Status Former   Current packs/day: 0.00   Types: Cigarettes   Quit date: 1990   Years since quitting: 35.4  Smokeless Tobacco Former   Quit date: 04/24/1988    Goals Met:  Independence with exercise equipment Exercise tolerated well No report of concerns or symptoms today  Goals Unmet:  Not Applicable  Comments: Pt able to follow exercise prescription today without complaint.  Will continue to monitor for progression.    Dr. Firman Hughes is Medical Director for [facility] Cardiac Rehabilitation.  Dr. Fuad Aleskerov is Medical Director for [facility] Pulmonary Rehabilitation.

## 2023-09-24 ENCOUNTER — Encounter

## 2023-09-26 ENCOUNTER — Encounter: Payer: Self-pay | Admitting: *Deleted

## 2023-09-26 ENCOUNTER — Encounter: Attending: Internal Medicine

## 2023-09-26 DIAGNOSIS — Z951 Presence of aortocoronary bypass graft: Secondary | ICD-10-CM | POA: Diagnosis present

## 2023-09-26 DIAGNOSIS — Z48812 Encounter for surgical aftercare following surgery on the circulatory system: Secondary | ICD-10-CM | POA: Insufficient documentation

## 2023-09-26 NOTE — Progress Notes (Signed)
 Cardiac Individual Treatment Plan  Patient Details  Name: Kirk Russell MRN: [id number] Date of Birth: [birth year] Referring Provider:   Flowsheet Row Cardiac Rehab from 07/11/2023 in [facility] Cardiac and Pulmonary Rehab  Referring Provider Burney Carter, MD       Initial Encounter Date:  Flowsheet Row Cardiac Rehab from 07/11/2023 in [facility] Cardiac and Pulmonary Rehab  Date 07/11/23       Visit Diagnosis: S/P CABG x 3  Patient's Home Medications on Admission:  Current Outpatient Medications:    albuterol  (VENTOLIN  HFA) 108 (90 Base) MCG/ACT inhaler, Inhale into the lungs every 6 (six) hours as needed for wheezing or shortness of breath., Disp: , Rfl:    alfuzosin  (UROXATRAL ) 10 MG 24 hr tablet, Take 1 tablet (10 mg total) by mouth daily with breakfast., Disp: 90 tablet, Rfl: 3   aspirin  EC 81 MG tablet, Take 81 mg by mouth daily., Disp: , Rfl:    citalopram  (CELEXA ) 10 MG tablet, Take 1 tablet by mouth daily., Disp: , Rfl:    clopidogrel  (PLAVIX ) 75 MG tablet, Take 1 tablet (75 mg total) by mouth daily. (Patient not taking: Reported on 07/05/2023), Disp: 30 tablet, Rfl: 1   dorzolamide -timolol  (COSOPT ) 22.3-6.8 MG/ML ophthalmic solution, 1 drop 2 (two) times daily., Disp: , Rfl:    heparin  25000 UT/250ML infusion, Inject 1,050 Units/hr into the vein continuous., Disp: 1000 mL, Rfl: 0   isosorbide  mononitrate (IMDUR ) 30 MG 24 hr tablet, Take 1 tablet (30 mg total) by mouth daily., Disp: 30 tablet, Rfl: 1   latanoprost  (XALATAN ) 0.005 % ophthalmic solution, 1 drop at bedtime., Disp: , Rfl:    lisinopril  (ZESTRIL ) 2.5 MG tablet, Take 1 tablet (2.5 mg total) by mouth daily. (Patient not taking: Reported on 07/05/2023), Disp: 30 tablet, Rfl: 1   metFORMIN (GLUCOPHAGE) 500 MG tablet, Take 1 tablet by mouth daily with breakfast. (Patient not taking: Reported on 07/05/2023), Disp: , Rfl:    nitroGLYCERIN  (NITROSTAT ) 0.4 MG SL tablet, Place 0.4 mg under the tongue every 5 (five) minutes as  needed for chest pain., Disp: , Rfl:    ranolazine  (RANEXA ) 1000 MG SR tablet, Take 1 tablet (1,000 mg total) by mouth 2 (two) times daily. 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 ears     Tobacco Use: Social History   Tobacco Use  Smoking Status Former   Current packs/day: 0.00   Types: Cigarettes   Quit date: 1990   Years since quitting: 35.4  Smokeless Tobacco Former   Quit date: 04/24/1988    Labs: Review Flowsheet       Latest Ref Rng & Units 12/14/2022 12/24/2022  Labs for ITP Cardiac and Pulmonary Rehab  Cholestrol  0 - 200 mg/dL - 409   LDL (calc) 0 - 99 mg/dL - 75   HDL-C >81 mg/dL - 51   Trlycerides <191 mg/dL - 87   Hemoglobin Y7W 4.8 - 5.6 % 6.1  -     Exercise Target Goals: Exercise Program Goal: Individual exercise prescription set using results from initial 6 min walk test and THRR while considering  patient's activity barriers and safety.   Exercise Prescription Goal: Initial exercise prescription builds to 30-45 minutes a day of aerobic activity, 2-3 days per week.  Home exercise guidelines will be given to patient during program as part of exercise prescription that the participant will acknowledge.   Education: Aerobic Exercise: - Group verbal and visual presentation on the components of exercise  prescription. Introduces F.I.T.T principle from ACSM for exercise prescriptions.  Reviews F.I.T.T. principles of aerobic exercise including progression. Written material given at graduation. Flowsheet Row Cardiac Rehab from 08/29/2023 in [facility] Cardiac and Pulmonary Rehab  Education need identified 07/11/23       Education: Resistance Exercise: - Group verbal and visual presentation on the components of exercise prescription. Introduces F.I.T.T principle from ACSM for exercise prescriptions  Reviews F.I.T.T. principles of resistance exercise including progression. Written material given at graduation.    Education: Exercise & Equipment Safety: - Individual verbal instruction and demonstration of equipment use and safety with use of the equipment. Flowsheet Row Cardiac Rehab from 08/29/2023 in [facility] Cardiac and Pulmonary Rehab  Date 07/11/23  Educator MB  Instruction Review Code 1- Verbalizes Understanding       Education: Exercise Physiology & General Exercise Guidelines: - Group verbal and written instruction with models to review the exercise physiology of the cardiovascular system and associated critical values. Provides general exercise guidelines with specific guidelines to those with heart or lung disease.    Education: Flexibility, Balance, Mind/Body Relaxation: - Group verbal and visual presentation with interactive activity on the components of exercise prescription. Introduces F.I.T.T principle from ACSM for exercise prescriptions. Reviews F.I.T.T. principles of flexibility and balance exercise training including progression. Also discusses the mind body connection.  Reviews various relaxation techniques to help reduce and manage stress (i.e. Deep breathing, progressive muscle relaxation, and visualization). Balance handout provided to take home. Written material given at graduation.   Activity Barriers & Risk Stratification:  Activity Barriers & Cardiac Risk Stratification -  07/11/23 1142       Activity Barriers & Cardiac Risk Stratification   Activity Barriers Shortness of Breath;Other (comment)    Comments Neuropathy    Cardiac Risk Stratification High             6 Minute Walk:  6 Minute Walk     Row Name 07/11/23 1141         6 Minute Walk   Phase Initial     Distance 1135 feet     Walk Time 6 minutes     # of Rest Breaks 0     MPH 2.15     METS 2.04     RPE 9     Perceived Dyspnea  0     VO2 Peak 7.14     Symptoms No     Resting HR 67 bpm     Resting BP 110/70     Resting Oxygen Saturation  98 %     Exercise Oxygen Saturation  during 6 min walk 96 %     Max Ex. HR 94 bpm     Max Ex. BP  130/70     2 Minute Post BP 110/72              Oxygen Initial Assessment:   Oxygen Re-Evaluation:   Oxygen Discharge (Final Oxygen Re-Evaluation):   Initial Exercise Prescription:  Initial Exercise Prescription - 07/11/23 1100       Date of Initial Exercise RX and Referring Provider   Date 07/11/23    Referring Provider Burney Carter, MD      Oxygen   Maintain Oxygen Saturation 88% or higher      Treadmill   MPH 1.8    Grade 0    Minutes 15    METs 2.38      Recumbant Bike   Level 2    RPM 50    Watts 15    Minutes 15    METs 2.04      NuStep   Level 2    SPM 80    Minutes 15    METs 2.04      REL-XR   Level 1    Speed 50    Minutes 15    METs 2.04      T5 Nustep   Level 2   T6   SPM 80    Minutes 15    METs 2.04      Prescription Details   Frequency (times per week) 3    Duration Progress to 30 minutes of continuous aerobic without signs/symptoms of physical distress      Intensity   THRR 40-80% of Max Heartrate 97-127    Ratings of Perceived Exertion 11-13    Perceived Dyspnea 0-4      Progression   Progression Continue to progress workloads to maintain intensity without signs/symptoms of physical distress.      Resistance Training   Training Prescription Yes    Weight 7 lb    Reps  10-15             Perform Capillary Blood Glucose checks as needed.  Exercise Prescription Changes:   Exercise Prescription Changes     Row Name 07/11/23 1100 07/25/23 1100 08/09/23 0800 08/23/23 0800 09/04/23 1400     Response to Exercise   Blood Pressure (Admit) 110/70 118/64 122/60 112/70 110/68   Blood Pressure (Exercise) 130/70 124/70 150/88 142/82 --   Blood Pressure (Exit) 110/72 102/68 112/70 100/60 112/62   Heart Rate (Admit) 67 bpm 77 bpm 63 bpm 89 bpm 64 bpm   Heart Rate (Exercise) 94 bpm 108 bpm 114 bpm 115 bpm 114 bpm   Heart Rate (Exit) 67 bpm 74 bpm 85 bpm 77 bpm 87 bpm   Oxygen Saturation (Admit) 98 % -- -- -- --   Oxygen Saturation (Exercise) 96 % -- -- -- --   Oxygen Saturation (Exit) 98 % -- -- -- --   Rating of Perceived Exertion (Exercise) 9 13 13 15 13    Perceived Dyspnea (Exercise) 0 0 -- -- --   Symptoms none none none none none   Comments results first 2 weeks of exercise -- -- --   Duration Progress to 30 minutes of  aerobic without signs/symptoms of physical distress Progress to 30 minutes of  aerobic without signs/symptoms of physical distress Continue with 30 min of aerobic exercise without signs/symptoms of physical distress. Continue with 30 min of aerobic exercise without signs/symptoms of physical distress. Continue with 30 min of aerobic exercise without signs/symptoms of physical distress.   Intensity THRR New THRR New THRR unchanged  THRR unchanged THRR unchanged     Progression   Progression Continue to progress workloads to maintain intensity without signs/symptoms of physical distress. Continue to progress workloads to maintain intensity without signs/symptoms of physical distress. Continue to progress workloads to maintain intensity without signs/symptoms of physical distress. Continue to progress workloads to maintain intensity without signs/symptoms of physical distress. Continue to progress workloads to maintain intensity without  signs/symptoms of physical distress.   Average METs 2.04 3.12 2.56 2.82 2.74     Resistance Training   Training Prescription -- Yes Yes Yes Yes   Weight -- 7 lb 7 lb 7 lb 7 lb   Reps -- 10-15 10-15 10-15 10-15     Interval Training   Interval Training -- No No No No     Treadmill   MPH -- 1.9 1.9 2.4 2.1   Grade -- 0 0 0 0.5   Minutes -- 15 15 15 15    METs -- 2.45 2.45 2.84 2.75     Recumbant Bike   Level -- -- 6 4 --   Watts -- -- 33 29 --   Minutes -- -- 15 15 --   METs -- -- 3.13 2.98 --     NuStep   Level -- -- 2 4 3    Minutes -- -- 15 15 15    METs -- -- -- 2.9 2.8     REL-XR   Level -- 2 3 3 3    Minutes -- 15 15 15 15    METs -- 3.9 3.4 3.6 3.7     T5 Nustep   Level -- -- 2  T6 1 2  T6   Minutes -- -- 15 15 15    METs -- -- 1.5 2.1 2.1     Track   Laps -- 42 32 35 32   Minutes -- 15 15 15 15    METs -- 3.28 2.74 2.9 2.74     Oxygen   Maintain Oxygen Saturation -- 88% or higher 88% or higher 88% or higher 88% or higher    Row Name 09/20/23 1000             Response to Exercise   Blood Pressure (Admit) 102/76       Blood Pressure (Exercise) 132/60       Blood Pressure (Exit) 104/64       Heart Rate (Admit) 66 bpm       Heart Rate (Exercise) 118 bpm       Heart Rate (Exit) 86 bpm       Rating of Perceived Exertion (Exercise) 13       Symptoms none       Duration Continue with 30 min of aerobic exercise without signs/symptoms of physical distress.       Intensity THRR unchanged         Progression   Progression Continue to progress workloads to maintain intensity without signs/symptoms of physical distress.       Average METs 2.88         Resistance Training   Training Prescription Yes       Weight 7 lb       Reps 10-15         Interval Training   Interval Training No         Treadmill   MPH 2.4       Minutes 15       METs 2.84         NuStep  Level 5       Minutes 15       METs 3.7         REL-XR   Level 3       Minutes 15        METs 3.6         T5 Nustep   Level 3  T6       Minutes 15       METs 2.1         Track   Laps 32       Minutes 15       METs 2.74         Oxygen   Maintain Oxygen Saturation 88% or higher                Exercise Comments:   Exercise Comments     Row Name 07/16/23 [phone]           Exercise Comments First full day of exercise!  Patient was oriented to gym and equipment including functions, settings, policies, and procedures.  Patient's individual exercise prescription and treatment plan were reviewed.  All starting workloads were established based on the results of the 6 minute walk test done at initial orientation visit.  The plan for exercise progression was also introduced and progression will be customized based on patient's performance and goals.                Exercise Goals and Review:   Exercise Goals     Row Name 07/11/23 1147             Exercise Goals   Increase Physical Activity Yes       Intervention Provide advice, education, support and counseling about physical activity/exercise needs.;Develop an individualized exercise prescription for aerobic and resistive training based on initial evaluation findings, risk stratification, comorbidities and participant's personal goals.       Expected Outcomes Short Term: Attend rehab on a regular basis to increase amount of physical activity.;Long Term: Add in home exercise to make exercise part of routine and to increase amount of physical activity.;Long Term: Exercising regularly at least 3-5 days a week.       Increase Strength and Stamina Yes       Intervention Provide advice, education, support and counseling about physical activity/exercise needs.;Develop an individualized exercise prescription for aerobic and resistive training based on initial evaluation findings, risk stratification, comorbidities and participant's personal goals.       Expected Outcomes Short Term: Increase workloads from initial exercise  prescription for resistance, speed, and METs.;Short Term: Perform resistance training exercises routinely during rehab and add in resistance training at home;Long Term: Improve cardiorespiratory fitness, muscular endurance and strength as measured by increased METs and functional capacity ( )       Able to understand and use rate of perceived exertion (RPE) scale Yes       Intervention Provide education and explanation on how to use RPE scale       Expected Outcomes Short Term: Able to use RPE daily in rehab to express subjective intensity level;Long Term:  Able to use RPE to guide intensity level when exercising independently       Able to understand and use Dyspnea scale Yes       Intervention Provide education and explanation on how to use Dyspnea scale       Expected Outcomes Short Term: Able to use Dyspnea scale daily in rehab to express subjective  sense of shortness of breath during exertion;Long Term: Able to use Dyspnea scale to guide intensity level when exercising independently       Knowledge and understanding of Target Heart Rate Range (THRR) Yes       Intervention Provide education and explanation of THRR including how the numbers were predicted and where they are located for reference       Expected Outcomes Short Term: Able to state/look up THRR;Short Term: Able to use daily as guideline for intensity in rehab;Long Term: Able to use THRR to govern intensity when exercising independently       Able to check pulse independently Yes       Intervention Provide education and demonstration on how to check pulse in carotid and radial arteries.;Review the importance of being able to check your own pulse for safety during independent exercise       Expected Outcomes Short Term: Able to explain why pulse checking is important during independent exercise;Long Term: Able to check pulse independently and accurately       Understanding of Exercise Prescription Yes       Intervention Provide  education, explanation, and written materials on patient's individual exercise prescription       Expected Outcomes Short Term: Able to explain program exercise prescription;Long Term: Able to explain home exercise prescription to exercise independently                Exercise Goals Re-Evaluation :  Exercise Goals Re-Evaluation     Row Name 07/16/23 [phone] 07/25/23 1147 08/09/23 0812 08/23/23 0858 09/04/23 1454     Exercise Goal Re-Evaluation   Exercise Goals Review Able to understand and use rate of perceived exertion (RPE) scale;Able to understand and use Dyspnea scale;Knowledge and understanding of Target Heart Rate Range (THRR);Understanding of Exercise Prescription Increase Physical Activity;Increase Strength and Stamina;Understanding of Exercise Prescription Increase Physical Activity;Increase Strength and Stamina;Understanding of Exercise Prescription Increase Physical Activity;Increase Strength and Stamina;Understanding of Exercise Prescription Increase Physical Activity;Increase Strength and Stamina;Understanding of Exercise Prescription   Comments Reviewed RPE and dyspnea scale, THR and program prescription with pt today.  Pt voiced understanding and was given a copy of goals to take home. Raenette Bumps is off to a good start in the program. He was able to use the Treadmill, XR, and track during his first 3 sessions. He was able to increase to a speed of 1. on the treadmill, and increase to level 2 on the XR. We will continue to monitor his progress in the program. Nadean August is doing well in rehab. He was able to increase to level 6 on the recumbent bike from level 3 and increased to level 3 on the XR. He maintained level 2 on the T4 and T6 nusteps. He also maintained his workload on the treadmill with a speed of 1.9 mph with no incline. He decreased his number of laps on the track to 32 from 42. We will continue to monitor his progress in the program. Nadean August continues to do well in rehab. He was able  to increase his speed on the treadmill from 1.9 to 2.54mph. He was also able to increase from level 2 to 4 on the T4 nustep. We will continue to monitor his progress in the program. Nadean August continues to do well in rehab. He increased his incline on the treadmill to 0.5% with a speed of 2.4 mph. He maintained level 3 on the XR. He decreased down to 32 laps from 35 on the track and down  to level 3 on the T4 nustep from 4. We will continue to monitor his progress in the program.   Expected Outcomes Short: Use RPE daily to regulate intensity. Long: Follow program prescription in THR. Short: Continue to increase treadmill workload. Long: Continue exercise to improve strength and stamina. Short: Try to increase back to 42 laps on the track. Long: Continue exercise to improve strength and stamina. Short: Continue to increase treadmill wokrloads. Long: Continue exercise to improve strength and stamina. Short: Increase back up to level 4 on the T4 nustep and 35 laps on the track. Long: Continue exercise to improve strength and stamina.    Row Name 09/20/23 1103             Exercise Goal Re-Evaluation   Exercise Goals Review Increase Physical Activity;Increase Strength and Stamina;Understanding of Exercise Prescription       Comments Nadean August is doing well in rehab. He was recently able to increase from level 2 to 3 on the T6 nustep, as well as increase from level 3 to 5 on the T4 nustep. He has been able to maintain 32 laps on the track in 15 minutes. We will continue to monitor his progress in the program.       Expected Outcomes Short: Continue to increase nustep workloads. Long: Continue exercise to improve strength and stamina.                Discharge Exercise Prescription (Final Exercise Prescription Changes):  Exercise Prescription Changes - 09/20/23 1000       Response to Exercise   Blood Pressure (Admit) 102/76    Blood Pressure (Exercise) 132/60    Blood Pressure (Exit) 104/64    Heart Rate  (Admit) 66 bpm    Heart Rate (Exercise) 118 bpm    Heart Rate (Exit) 86 bpm    Rating of Perceived Exertion (Exercise) 13    Symptoms none    Duration Continue with 30 min of aerobic exercise without signs/symptoms of physical distress.    Intensity THRR unchanged      Progression   Progression Continue to progress workloads to maintain intensity without signs/symptoms of physical distress.    Average METs 2.88      Resistance Training   Training Prescription Yes    Weight 7 lb    Reps 10-15      Interval Training   Interval Training No      Treadmill   MPH 2.4    Minutes 15    METs 2.84      NuStep   Level 5    Minutes 15    METs 3.7      REL-XR   Level 3    Minutes 15    METs 3.6      T5 Nustep   Level 3   T6   Minutes 15    METs 2.1      Track   Laps 32    Minutes 15    METs 2.74      Oxygen   Maintain Oxygen Saturation 88% or higher             Nutrition:  Target Goals: Understanding of nutrition guidelines, daily intake of sodium 1500mg , cholesterol 200mg , calories 30% from fat and 7% or less from saturated fats, daily to have 5 or more servings of fruits and vegetables.  Education: All About Nutrition: -Group instruction provided by verbal, written material, interactive activities, discussions, models, and posters to present general guidelines  for heart healthy nutrition including fat, fiber, MyPlate, the role of sodium in heart healthy nutrition, utilization of the nutrition label, and utilization of this knowledge for meal planning. Follow up email sent as well. Written material given at graduation. Flowsheet Row Cardiac Rehab from 08/29/2023 in [facility] Cardiac and Pulmonary Rehab  Education need identified 07/11/23  Date 07/18/23  Educator JG  Instruction Review Code 1- Verbalizes Understanding       Biometrics:  Pre Biometrics - 07/11/23 1149       Pre Biometrics   Height 5' 8.9" (1.75 m)    Weight 202 lb 12.8 oz (92 kg)    Waist  Circumference 43 inches    Hip Circumference 44.5 inches    Waist to Hip Ratio 0.97 %    BMI (Calculated) 30.04    Single Leg Stand 3 seconds              Nutrition Therapy Plan and Nutrition Goals:  Nutrition Therapy & Goals - 07/11/23 1153       Nutrition Therapy   RD appointment deferred Yes      Personal Nutrition Goals   Nutrition Goal RD appointment deferred at this time      Intervention Plan   Intervention Prescribe, educate and counsel regarding individualized specific dietary modifications aiming towards targeted core components such as weight, hypertension, lipid management, diabetes, heart failure and other comorbidities.    Expected Outcomes Short Term Goal: Understand basic principles of dietary content, such as calories, fat, sodium, cholesterol and nutrients.             Nutrition Assessments:  MEDIFICTS Score Key: >=70 Need to make dietary changes  40-70 Heart Healthy Diet <= 40 Therapeutic Level Cholesterol Diet  Flowsheet Row Cardiac Rehab from 07/11/2023 in [facility] Cardiac and Pulmonary Rehab  Picture Your Plate Total Score on Admission 36      Picture Your Plate Scores: <40 Unhealthy dietary pattern with much room for improvement. 41-50 Dietary pattern unlikely to meet recommendations for good health and room for improvement. 51-60 More healthful dietary pattern, with some room for improvement.  >60 Healthy dietary pattern, although there may be some specific behaviors that could be improved.    Nutrition Goals Re-Evaluation:  Nutrition Goals Re-Evaluation     Row Name 08/22/23 0932 09/21/23 0934           Goals   Nutrition Goal RD appointment deferred at this time RD appointment deferred at this time               Nutrition Goals Discharge (Final Nutrition Goals Re-Evaluation):  Nutrition Goals Re-Evaluation - 09/21/23 0934       Goals   Nutrition Goal RD appointment deferred at this time              Psychosocial: Target Goals: Acknowledge presence or absence of significant depression and/or stress, maximize coping skills, provide positive support system. Participant is able to verbalize types and ability to use techniques and skills needed for reducing stress and depression.   Education: Stress, Anxiety, and Depression - Group verbal and visual presentation to define topics covered.  Reviews how body is impacted by stress, anxiety, and depression.  Also discusses healthy ways to reduce stress and to treat/manage anxiety and depression.  Written material given at graduation. Flowsheet Row Cardiac Rehab from 08/29/2023 in [facility] Cardiac and Pulmonary Rehab  Date 08/29/23  Educator [facility]  Instruction Review Code 1- Verbalizes Understanding  Education: Sleep Hygiene -Provides group verbal and written instruction about how sleep can affect your health.  Define sleep hygiene, discuss sleep cycles and impact of sleep habits. Review good sleep hygiene tips.    Initial Review & Psychosocial Screening:  Initial Psych Review & Screening - 07/05/23 1341       Initial Review   Current issues with Current Stress Concerns;Current Anxiety/Panic    Source of Stress Concerns Unable to perform yard/household activities    Comments Aniety over managing a large estate for sister,   cant get out golfing yet.      Family Dynamics   Good Support System? Yes   wife     Barriers   Psychosocial barriers to participate in program There are no identifiable barriers or psychosocial needs.      Screening Interventions   Interventions To provide support and resources with identified psychosocial needs;Provide feedback about the scores to participant;Encouraged to exercise    Expected Outcomes Short Term goal: Utilizing psychosocial counselor, staff and physician to assist with identification of specific Stressors or current issues interfering with healing process. Setting desired goal for each stressor or  current issue identified.;Long Term Goal: Stressors or current issues are controlled or eliminated.;Short Term goal: Identification and review with participant of any Quality of Life or Depression concerns found by scoring the questionnaire.;Long Term goal: The participant improves quality of Life and PHQ9 Scores as seen by post scores and/or verbalization of changes             Quality of Life Scores:   Quality of Life - 07/11/23 1150       Quality of Life   Select Quality of Life      Quality of Life Scores   Health/Function Pre 17.43 %    Socioeconomic Pre 23.83 %    Psych/Spiritual Pre 22.71 %    Family Pre 24.6 %    GLOBAL Pre 20.8 %            Scores of 19 and below usually indicate a poorer quality of life in these areas.  A difference of  2-3 points is a clinically meaningful difference.  A difference of 2-3 points in the total score of the Quality of Life Index has been associated with significant improvement in overall quality of life, self-image, physical symptoms, and general health in studies assessing change in quality of life.  PHQ-9: Review Flowsheet       07/11/2023  Depression screen PHQ 2/9  Decreased Interest 1  Down, Depressed, Hopeless 0  PHQ - 2 Score 1  Altered sleeping 0  Tired, decreased energy 1  Change in appetite 0  Feeling bad or failure about yourself  0  Trouble concentrating 2  Moving slowly or fidgety/restless 0  Suicidal thoughts 0  PHQ-9 Score 4  Difficult doing work/chores Not difficult at all   Interpretation of Total Score  Total Score Depression Severity:  1-4 = Minimal depression, 5-9 = Mild depression, 10-14 = Moderate depression, 15-19 = Moderately severe depression, 20-27 = Severe depression   Psychosocial Evaluation and Intervention:  Psychosocial Evaluation - 07/05/23 1357       Psychosocial Evaluation & Interventions   Interventions Encouraged to exercise with the program and follow exercise prescription     Comments There are no barriers to his starting the program.  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  "If I'm not on golfcourse then I  am mowing.  He lives with his wife and she is his support.  He controls his diabetes with diet and exercise.  He would like to drop 15 pounds. He is rady to strt the program    Expected Outcomes STG attend all scheduled sessions, meet with the RD and work on exercise progression as advised by EP.   LTG aintain exercise progression and get back on the golf course.    Continue Psychosocial Services  Follow up required by staff             Psychosocial Re-Evaluation:  Psychosocial Re-Evaluation     Row Name 08/22/23 [phone] 09/21/23 0931           Psychosocial Re-Evaluation   Current issues with Current Anxiety/Panic;Current Psychotropic Meds None Identified      Comments Raenette Bumps takes some medication for anxiety. He feels when he gets out of his house it improves his mood. He has not had a history of depression and states that his mood change is new if he is in the house for to long. Nadean August is no longer taking any medication for anxiety. He states that he isnt having any stress concerns right now, and is leeping well. He looks forward to playing golf again this summer to help him deal with his stress.      Expected Outcomes Short: Continue to attend [facility] regularly for regular exercise and social engagement. Long: Continue to improve symptoms and manage a positive mental state. Short: Continue to attend [facility] regularly for regular exercise and social engagement. Long: Continue to improve symptoms and manage a positive mental state.      Interventions Encouraged to attend Cardiac Rehabilitation for the exercise Encouraged to attend Cardiac Rehabilitation for the exercise      Continue Psychosocial Services  Follow up required by staff Follow up required by staff               Psychosocial Discharge (Final  Psychosocial Re-Evaluation):  Psychosocial Re-Evaluation - 09/21/23 0931       Psychosocial Re-Evaluation   Current issues with None Identified    Comments Nadean August is no longer taking any medication for anxiety. He states that he isnt having any stress concerns right now, and is leeping well. He looks forward to playing golf again this summer to help him deal with his stress.    Expected Outcomes Short: Continue to attend [facility] regularly for regular exercise and social engagement. Long: Continue to improve symptoms and manage a positive mental state.    Interventions Encouraged to attend Cardiac Rehabilitation for the exercise    Continue Psychosocial Services  Follow up required by staff             Vocational Rehabilitation: Provide vocational rehab assistance to qualifying candidates.   Vocational Rehab Evaluation & Intervention:   Education: Education Goals: Education classes will be provided on a variety of topics geared toward better understanding of heart health and risk factor modification. Participant will state understanding/return demonstration of topics presented as noted by education test scores.  Learning Barriers/Preferences:  Learning Barriers/Preferences - 07/05/23 1346       Learning Barriers/Preferences   Learning Barriers Hearing    Learning Preferences None             General Cardiac Education Topics:  AED/CPR: - Group verbal and written instruction with the use of models to demonstrate the basic use of the AED with the basic ABC's of resuscitation.   Anatomy and Cardiac  Procedures: - Group verbal and visual presentation and models provide information about basic cardiac anatomy and function. Reviews the testing methods done to diagnose heart disease and the outcomes of the test results. Describes the treatment choices: Medical Management, Angioplasty, or Coronary Bypass Surgery for treating various heart conditions including Myocardial  Infarction, Angina, Valve Disease, and Cardiac Arrhythmias.  Written material given at graduation. Flowsheet Row Cardiac Rehab from 08/29/2023 in [facility] Cardiac and Pulmonary Rehab  Education need identified 07/11/23       Medication Safety: - Group verbal and visual instruction to review commonly prescribed medications for heart and lung disease. Reviews the medication, class of the drug, and side effects. Includes the steps to properly store meds and maintain the prescription regimen.  Written material given at graduation.   Intimacy: - Group verbal instruction through game format to discuss how heart and lung disease can affect sexual intimacy. Written material given at graduation..   Know Your Numbers and Heart Failure: - Group verbal and visual instruction to discuss disease risk factors for cardiac and pulmonary disease and treatment options.  Reviews associated critical values for Overweight/Obesity, Hypertension, Cholesterol, and Diabetes.  Discusses basics of heart failure: signs/symptoms and treatments.  Introduces Heart Failure Zone chart for action plan for heart failure.  Written material given at graduation. Flowsheet Row Cardiac Rehab from 08/29/2023 in [facility] Cardiac and Pulmonary Rehab  Education need identified 07/11/23       Infection Prevention: - Provides verbal and written material to individual with discussion of infection control including proper hand washing and proper equipment cleaning during exercise session. Flowsheet Row Cardiac Rehab from 08/29/2023 in [facility] Cardiac and Pulmonary Rehab  Date 07/11/23  Educator MB  Instruction Review Code 1- Verbalizes Understanding       Falls Prevention: - Provides verbal and written material to individual with discussion of falls prevention and safety. Flowsheet Row Cardiac Rehab from 08/29/2023 in [facility] Cardiac and Pulmonary Rehab  Date 07/11/23  Educator MB  Instruction Review Code 1- Verbalizes Understanding        Other: -Provides group and verbal instruction on various topics (see comments)   Knowledge Questionnaire Score:  Knowledge Questionnaire Score - 07/11/23 1154       Knowledge Questionnaire Score   Pre Score 21/26             Core Components/Risk Factors/Patient Goals at Admission:  Personal Goals and Risk Factors at Admission - 07/11/23 1154       Core Components/Risk Factors/Patient Goals on Admission    Weight Management Yes    Intervention Weight Management: Develop a combined nutrition and exercise program designed to reach desired caloric intake, while maintaining appropriate intake of nutrient and fiber, sodium and fats, and appropriate energy expenditure required for the weight goal.    Admit Weight 202 lb 12.8 oz (92 kg)    Goal Weight: Short Term 198 lb (89.8 kg)    Goal Weight: Long Term 185 lb (83.9 kg)    Expected Outcomes Short Term: Continue to assess and modify interventions until short term weight is achieved;Long Term: Adherence to nutrition and physical activity/exercise program aimed toward attainment of established weight goal;Weight Loss: Understanding of general recommendations for a balanced deficit meal plan, which promotes 1-2 lb weight loss per week and includes a negative energy balance of [phone] kcal/d;Understanding recommendations for meals to include 15-35% energy as protein, 25-35% energy from fat, 35-60% energy from carbohydrates, less than 200mg  of dietary cholesterol, 20-35 gm of total fiber  daily;Understanding of distribution of calorie intake throughout the day with the consumption of 4-5 meals/snacks    Improve shortness of breath with ADL's Yes    Intervention Provide education, individualized exercise plan and daily activity instruction to help decrease symptoms of SOB with activities of daily living.    Expected Outcomes Short Term: Improve cardiorespiratory fitness to achieve a reduction of symptoms when performing ADLs;Long Term: Be able  to perform more ADLs without symptoms or delay the onset of symptoms    Diabetes Yes   Diet-controlled   Intervention Provide education about signs/symptoms and action to take for hypo/hyperglycemia.;Provide education about proper nutrition, including hydration, and aerobic/resistive exercise prescription along with prescribed medications to achieve blood glucose in normal ranges: Fasting glucose 65-99 mg/dL    Expected Outcomes Short Term: Participant verbalizes understanding of the signs/symptoms and immediate care of hyper/hypoglycemia, proper foot care and importance of medication, aerobic/resistive exercise and nutrition plan for blood glucose control.;Long Term: Attainment of HbA1C < 7%.    Hypertension Yes    Intervention Provide education on lifestyle modifcations including regular physical activity/exercise, weight management, moderate sodium restriction and increased consumption of fresh fruit, vegetables, and low fat dairy, alcohol moderation, and smoking cessation.;Monitor prescription use compliance.    Expected Outcomes Short Term: Continued assessment and intervention until BP is < 140/66mm HG in hypertensive participants. < 130/70mm HG in hypertensive participants with diabetes, heart failure or chronic kidney disease.;Long Term: Maintenance of blood pressure at goal levels.    Lipids Yes    Intervention Provide education and support for participant on nutrition & aerobic/resistive exercise along with prescribed medications to achieve LDL 70mg , HDL >40mg .    Expected Outcomes Short Term: Participant states understanding of desired cholesterol values and is compliant with medications prescribed. Participant is following exercise prescription and nutrition guidelines.;Long Term: Cholesterol controlled with medications as prescribed, with individualized exercise RX and with personalized nutrition plan. Value goals: LDL < 70mg , HDL > 40 mg.             Education:Diabetes - Individual  verbal and written instruction to review signs/symptoms of diabetes, desired ranges of glucose level fasting, after meals and with exercise. Acknowledge that pre and post exercise glucose checks will be done for 3 sessions at entry of program. Flowsheet Row Cardiac Rehab from 08/29/2023 in [facility] Cardiac and Pulmonary Rehab  Date 07/11/23  Educator MB  Instruction Review Code 1- Verbalizes Understanding       Core Components/Risk Factors/Patient Goals Review:   Goals and Risk Factor Review     Row Name 08/22/23 [phone] 09/21/23 0934           Core Components/Risk Factors/Patient Goals Review   Personal Goals Review Diabetes Diabetes      Review Raenette Bumps states that his A1C is doing well and is checking his blood glucose at home. He has a beer every now and then oppsed to 3 a day. He has been making changes in his diet to help with his sugar. His morning blood sugar on average is 112. Nadean August states that he checks his blood sugar every other day. Nadean August states that he is still drinking 2 16oz miller light beers every night. He is still managing his diet to keep his blood sugar within a healthy range.      Expected Outcomes Short: continue checking blood sugars. Long: reduce A1C. Short: Continue to maintain diet, and manage blood sugar. Long: Reduce A1C  Core Components/Risk Factors/Patient Goals at Discharge (Final Review):   Goals and Risk Factor Review - 09/21/23 0934       Core Components/Risk Factors/Patient Goals Review   Personal Goals Review Diabetes    Review Nadean August states that he checks his blood sugar every other day. Nadean August states that he is still drinking 2 16oz miller light beers every night. He is still managing his diet to keep his blood sugar within a healthy range.    Expected Outcomes Short: Continue to maintain diet, and manage blood sugar. Long: Reduce A1C             ITP Comments:  ITP Comments     Row Name 07/05/23 1357 07/11/23 1141 07/16/23 0956  08/01/23 0737 08/29/23 1255   ITP Comments Virtual orientation call completed today. he has an appointment on Date: 07/11/2023  for EP eval and gym Orientation.  Documentation of diagnosis can be found in [facility] 06/27/2023 . Completed and gym orientation. Initial ITP created and sent for review to Dr. Firman Hughes, Medical Director. First full day of exercise!  Patient was oriented to gym and equipment including functions, settings, policies, and procedures.  Patient's individual exercise prescription and treatment plan were reviewed.  All starting workloads were established based on the results of the 6 minute walk test done at initial orientation visit.  The plan for exercise progression was also introduced and progression will be customized based on patient's performance and goals. 30 Day review completed. Medical Director ITP review done, changes made as directed, and signed approval by Medical Director.    new to program 30 Day review completed. Medical Director ITP review done, changes made as directed, and signed approval by Medical Director.    Row Name 09/26/23 0920           ITP Comments 30 Day review completed. Medical Director ITP review done, changes made as directed, and signed approval by Medical Director.                Comments:

## 2023-09-26 NOTE — Progress Notes (Signed)
 Daily Session Note  Patient Details  Name: Kirk Russell MRN: [id number] Date of Birth: January 20, 1946 Referring Provider:   Flowsheet Row Cardiac Rehab from 07/11/2023 in [facility] Cardiac and Pulmonary Rehab  Referring Provider Burney Carter, MD       Encounter Date: 09/26/2023  Check In:  Session Check In - 09/26/23 0934       Check-In   Supervising physician immediately available to respond to emergencies See telemetry face sheet for immediately available ER MD    Location [facility]-Cardiac & Pulmonary Rehab    Staff Present Sherle Dire, BS, Exercise Physiologist;Bowyn Mercier RN,BSN,MPA;Maxon Conetta BS, Exercise Physiologist;Joseph Lacinda Pica RCP,RRT,BSRT    Virtual Visit No    Medication changes reported     No    Fall or balance concerns reported    No    Tobacco Cessation No Change    Warm-up and Cool-down Performed on first and last piece of equipment    Resistance Training Performed Yes    VAD Patient? No    PAD/SET Patient? No      Pain Assessment   Currently in Pain? No/denies                Social History   Tobacco Use  Smoking Status Former   Current packs/day: 0.00   Types: Cigarettes   Quit date: 1990   Years since quitting: 35.4  Smokeless Tobacco Former   Quit date: 04/24/1988    Goals Met:  Independence with exercise equipment Exercise tolerated well No report of concerns or symptoms today Strength training completed today  Goals Unmet:  Not Applicable  Comments: Pt able to follow exercise prescription today without complaint.  Will continue to monitor for progression.    Dr. Firman Hughes is Medical Director for [facility] Cardiac Rehabilitation.  Dr. Fuad Aleskerov is Medical Director for [facility] Pulmonary Rehabilitation.

## 2023-09-27 ENCOUNTER — Ambulatory Visit (INDEPENDENT_AMBULATORY_CARE_PROVIDER_SITE_OTHER): Admitting: Urology

## 2023-09-27 ENCOUNTER — Encounter: Payer: Self-pay | Admitting: Urology

## 2023-09-27 VITALS — BP 137/83 | HR 108 | Ht 69.0 in | Wt 201.0 lb

## 2023-09-27 DIAGNOSIS — N401 Enlarged prostate with lower urinary tract symptoms: Secondary | ICD-10-CM

## 2023-09-27 DIAGNOSIS — R3914 Feeling of incomplete bladder emptying: Secondary | ICD-10-CM

## 2023-09-27 LAB — BLADDER SCAN AMB NON-IMAGING: Scan Result: 263

## 2023-09-27 MED ORDER — ALFUZOSIN HCL ER 10 MG PO TB24: 10.0000 mg | ORAL_TABLET | Freq: Every day | ORAL | 3 refills | Status: AC

## 2023-09-27 NOTE — Progress Notes (Signed)
 09/27/2023 8:32 AM   Kirk Russell Rude 02-17-1946 [id number]  Referring provider: Little Riff, MD [address] [facility] [place],  [place] [address]  Chief Complaint  Patient presents with   Benign Prostatic Hypertrophy    Urologic history: 1.  BPH with incomplete bladder emptying -Alfuzosin  daily -Baseline PVR <150 mL   2.  History urethral stricture disease -Cystoscopy 09/2018 with moderate lateral lobe enlargement; no stricture identified  HPI: 78 y.o. male presenting for annual follow-up.  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 ears     Surgical History: Past Surgical History:  Procedure Laterality Date   ABDOMINAL SURGERY     APPENDECTOMY     CATARACT EXTRACTION W/PHACO Right 02/04/2020   Procedure: CATARACT EXTRACTION PHACO AND INTRAOCULAR LENS PLACEMENT (IOC) RIGHT DIABETIC  6.01  01:11.3;  Surgeon: Annell Kidney, MD;  Location: [facility];  Service: Ophthalmology;  Laterality: Right;  Diabetic - oral meds   COLONOSCOPY     COLONOSCOPY     COLONOSCOPY N/A 05/30/2021   Procedure: COLONOSCOPY;  Surgeon: Quintin Buckle, DO;  Location: [facility] ENDOSCOPY;  Service: Gastroenterology;  Laterality: N/A;  DIET CONTROLLED   COLONOSCOPY WITH PROPOFOL  N/A 01/22/2018   Procedure:  COLONOSCOPY WITH PROPOFOL ;  Surgeon: Deveron Fly, MD;  Location: [facility] ENDOSCOPY;  Service: Endoscopy;  Laterality: N/A;   CORONARY ANGIOPLASTY WITH STENT PLACEMENT     LEFT HEART CATH AND CORONARY ANGIOGRAPHY Left 12/27/2022   Procedure: LEFT HEART CATH AND CORONARY ANGIOGRAPHY;  Surgeon: Antonette Batters, MD;  Location: [facility] INVASIVE CV LAB;  Service: Cardiovascular;  Laterality: Left;    Home Medications:  Allergies as of 09/27/2023       Reactions   Lovastatin Other (See Comments)   Other reaction(s): Muscle Pain With higher doses        Medication List        Accurate as of September 27, 2023  8:32 AM. If you have any questions, ask your nurse or doctor.          STOP taking these medications    clopidogrel  75 MG tablet Commonly known as: PLAVIX    heparin  25000 UT/250ML infusion   isosorbide  mononitrate 30 MG 24 hr tablet Commonly known as: IMDUR    lisinopril  2.5 MG tablet Commonly known as: ZESTRIL    metFORMIN 500 MG tablet Commonly known as: GLUCOPHAGE   ranolazine  1000 MG SR tablet Commonly known as: RANEXA        TAKE these medications    albuterol  108 (90 Base) MCG/ACT inhaler Commonly known as: VENTOLIN  HFA Inhale into the lungs every 6 (six) hours as needed for wheezing or shortness of breath.   alfuzosin  10 MG 24 hr tablet Commonly known  as: UROXATRAL  Take 1 tablet (10 mg total) by mouth daily with breakfast.   aspirin  EC 81 MG tablet Take 81 mg by mouth daily.   citalopram  10 MG tablet Commonly known as: CELEXA  Take 1 tablet by mouth daily.   dorzolamide -timolol  2-0.5 % ophthalmic solution Commonly known as: COSOPT  1 drop 2 (two) times daily.   latanoprost  0.005 % ophthalmic solution Commonly known as: XALATAN  1 drop at bedtime.   nitroGLYCERIN  0.4 MG SL tablet Commonly known as: NITROSTAT  Place 0.4 mg under the tongue every 5 (five) minutes as needed for chest pain.   rosuvastatin  40 MG tablet Commonly known as: CRESTOR  Take  1 tablet (40 mg total) by mouth daily.        Allergies:  Allergies  Allergen Reactions   Lovastatin Other (See Comments)    Other reaction(s): Muscle Pain With higher doses    Family History: Family History  Problem Relation Age of Onset   Breast cancer Mother    Prostate cancer Neg Hx    Kidney cancer Neg Hx    Bladder Cancer Neg Hx     Social History:  reports that he quit smoking about 35 years ago. His smoking use included cigarettes. He quit smokeless tobacco use about 35 years ago. He reports current alcohol use of about 3.0 standard drinks of alcohol per week. He reports that he does not use drugs.   Physical Exam: BP 137/83   Pulse (!) 108   Ht 5\' 9"  (1.753 m)   Wt 201 lb (91.2 kg)   BMI 29.68 kg/m   Constitutional:  Alert and oriented, No acute distress. Psychiatric: Normal mood and affect.   Assessment & Plan:    1.  BPH with LUTS PVR today 263 mL Continue Alfuzosin -refill sent to pharmacy. PA follow-up 3 months for repeat PVR since elevated above baseline 1 year follow up with PVR.  2.  Incomplete bladder emptying As above   Geraline Knapp, MD  [facility] [address], [address] [place], [place] [address] [phone]

## 2023-09-28 ENCOUNTER — Encounter

## 2023-09-28 VITALS — Ht 68.9 in | Wt 205.7 lb

## 2023-09-28 DIAGNOSIS — Z48812 Encounter for surgical aftercare following surgery on the circulatory system: Secondary | ICD-10-CM | POA: Diagnosis not present

## 2023-09-28 DIAGNOSIS — Z951 Presence of aortocoronary bypass graft: Secondary | ICD-10-CM

## 2023-09-28 NOTE — Progress Notes (Signed)
 Daily Session Note  Patient Details  Name: Kirk Russell MRN: [id number] Date of Birth: [birth year] Referring Provider:   Flowsheet Row Cardiac Rehab from 07/11/2023 in [facility] Cardiac and Pulmonary Rehab  Referring Provider Burney Carter, MD       Encounter Date: 09/28/2023  Check In:  Session Check In - 09/28/23 0908       Check-In   Supervising physician immediately available to respond to emergencies See telemetry face sheet for immediately available ER MD    Location [facility]-Cardiac & Pulmonary Rehab    Staff Present Sherle Dire, BS, Exercise Physiologist;Annaleia Pence RN,BSN,MPA;Jean Alejos Sabra Cramp BS, ACSM CEP, Exercise Physiologist    Virtual Visit No    Medication changes reported     No    Fall or balance concerns reported    No    Tobacco Cessation No Change    Warm-up and Cool-down Performed on first and last piece of equipment    Resistance Training Performed Yes    VAD Patient? No    PAD/SET Patient? No      Pain Assessment   Currently in Pain? No/denies                Social History   Tobacco Use  Smoking Status Former   Current packs/day: 0.00   Types: Cigarettes   Quit date: 1990   Years since quitting: 35.4  Smokeless Tobacco Former   Quit date: 04/24/1988    Goals Met:  Independence with exercise equipment Exercise tolerated well Personal goals reviewed No report of concerns or symptoms today Strength training completed today  Goals Unmet:  Not Applicable  Comments: Pt able to follow exercise prescription today without complaint.  Will continue to monitor for progression.   Reviewed home exercise with pt today.  Pt plans to walk at home or the mall for exercise.  Reviewed THR, pulse, RPE, sign and symptoms, pulse oximetery and when to call 911 or MD.  Also discussed weather considerations and indoor options.  Pt voiced understanding.   Dr. Firman Hughes is Medical Director for [facility] Cardiac Rehabilitation.  Dr. Fuad Aleskerov is  Medical Director for [facility] Pulmonary Rehabilitation.

## 2023-09-28 NOTE — Patient Instructions (Signed)
 Discharge Patient Instructions  Patient Details  Name: Kirk Russell MRN: [id number] Date of Birth: 10-23-1945 Referring Provider:  Little Riff, MD   Number of Visits: 36  Reason for Discharge:  Patient reached a stable level of exercise. Patient independent in their exercise. Patient has met program and personal goals.  Diagnosis:  S/P CABG x 3  Initial Exercise Prescription:  Initial Exercise Prescription - 07/11/23 1100       Date of Initial Exercise RX and Referring Provider   Date 07/11/23    Referring Provider Burney Carter, MD      Oxygen   Maintain Oxygen Saturation 88% or higher      Treadmill   MPH 1.8    Grade 0    Minutes 15    METs 2.38      Recumbant Bike   Level 2    RPM 50    Watts 15    Minutes 15    METs 2.04      NuStep   Level 2    SPM 80    Minutes 15    METs 2.04      REL-XR   Level 1    Speed 50    Minutes 15    METs 2.04      T5 Nustep   Level 2   T6   SPM 80    Minutes 15    METs 2.04      Prescription Details   Frequency (times per week) 3    Duration Progress to 30 minutes of continuous aerobic without signs/symptoms of physical distress      Intensity   THRR 40-80% of Max Heartrate 97-127    Ratings of Perceived Exertion 11-13    Perceived Dyspnea 0-4      Progression   Progression Continue to progress workloads to maintain intensity without signs/symptoms of physical distress.      Resistance Training   Training Prescription Yes    Weight 7 lb    Reps 10-15             Discharge Exercise Prescription (Final Exercise Prescription Changes):  Exercise Prescription Changes - 09/20/23 1000       Response to Exercise   Blood Pressure (Admit) 102/76    Blood Pressure (Exercise) 132/60    Blood Pressure (Exit) 104/64    Heart Rate (Admit) 66 bpm    Heart Rate (Exercise) 118 bpm    Heart Rate (Exit) 86 bpm    Rating of Perceived Exertion (Exercise) 13    Symptoms none    Duration  Continue with 30 min of aerobic exercise without signs/symptoms of physical distress.    Intensity THRR unchanged      Progression   Progression Continue to progress workloads to maintain intensity without signs/symptoms of physical distress.    Average METs 2.88      Resistance Training   Training Prescription Yes    Weight 7 lb    Reps 10-15      Interval Training   Interval Training No      Treadmill   MPH 2.4    Minutes 15    METs 2.84      NuStep   Level 5    Minutes 15    METs 3.7      REL-XR   Level 3    Minutes 15    METs 3.6      T5 Nustep   Level 3   T6  Minutes 15    METs 2.1      Track   Laps 32    Minutes 15    METs 2.74      Oxygen   Maintain Oxygen Saturation 88% or higher             Functional Capacity:  6 Minute Walk     Row Name 07/11/23 1141 09/28/23 0929       6 Minute Walk   Phase Initial Discharge    Distance 1135 feet 1245 feet    Distance % Change -- 9.7 %    Distance Feet Change -- 110 ft    Walk Time 6 minutes 6 minutes    # of Rest Breaks 0 0    MPH 2.15 2.36    METS 2.04 2.44    RPE 9 9    Perceived Dyspnea  0 2    VO2 Peak 7.14 8.52    Symptoms No Yes (comment)    Comments -- SOB    Resting HR 67 bpm 72 bpm    Resting BP 110/70 126/72    Resting Oxygen Saturation  98 % 94 %    Exercise Oxygen Saturation  during 6 min walk 96 % 92 %    Max Ex. HR 94 bpm 105 bpm    Max Ex. BP 130/70 146/74    2 Minute Post BP 110/72 --            Nutrition & Weight - Outcomes:  Pre Biometrics - 07/11/23 1149       Pre Biometrics   Height 5' 8.9" (1.75 m)    Weight 202 lb 12.8 oz (92 kg)    Waist Circumference 43 inches    Hip Circumference 44.5 inches    Waist to Hip Ratio 0.97 %    BMI (Calculated) 30.04    Single Leg Stand 3 seconds             Post Biometrics - 09/28/23 0930        Post  Biometrics   Height 5' 8.9" (1.75 m)    Weight 205 lb 11.2 oz (93.3 kg)    Waist Circumference 43 inches     Hip Circumference 44 inches    Waist to Hip Ratio 0.98 %    BMI (Calculated) 30.47    Single Leg Stand 5.9 seconds             Nutrition:  Nutrition Therapy & Goals - 07/11/23 1153       Nutrition Therapy   RD appointment deferred Yes      Personal Nutrition Goals   Nutrition Goal RD appointment deferred at this time      Intervention Plan   Intervention Prescribe, educate and counsel regarding individualized specific dietary modifications aiming towards targeted core components such as weight, hypertension, lipid management, diabetes, heart failure and other comorbidities.    Expected Outcomes Short Term Goal: Understand basic principles of dietary content, such as calories, fat, sodium, cholesterol and nutrients.

## 2023-10-01 ENCOUNTER — Encounter: Admitting: *Deleted

## 2023-10-01 DIAGNOSIS — Z48812 Encounter for surgical aftercare following surgery on the circulatory system: Secondary | ICD-10-CM | POA: Diagnosis not present

## 2023-10-01 DIAGNOSIS — Z951 Presence of aortocoronary bypass graft: Secondary | ICD-10-CM

## 2023-10-01 NOTE — Progress Notes (Signed)
 Daily Session Note  Patient Details  Name: Kirk Russell MRN: [id number] Date of Birth: May 27, 1945 Referring Provider:   Flowsheet Row Cardiac Rehab from 07/11/2023 in [facility] Cardiac and Pulmonary Rehab  Referring Provider Burney Carter, MD       Encounter Date: 10/01/2023  Check In:  Session Check In - 10/01/23 0942       Check-In   Supervising physician immediately available to respond to emergencies See telemetry face sheet for immediately available ER MD    Location [facility]-Cardiac & Pulmonary Rehab    Staff Present Maud Sorenson, RN, BSN, CCRP;Meredith Manson Seitz RN,BSN;Maxon Dickson BS, Exercise Physiologist;Kelly Bollinger [facility]    Virtual Visit No    Medication changes reported     No    Fall or balance concerns reported    No    Warm-up and Cool-down Performed on first and last piece of equipment    Resistance Training Performed Yes    VAD Patient? No    PAD/SET Patient? No      Pain Assessment   Currently in Pain? No/denies                Social History   Tobacco Use  Smoking Status Former   Current packs/day: 0.00   Types: Cigarettes   Quit date: 1990   Years since quitting: 35.4  Smokeless Tobacco Former   Quit date: 04/24/1988    Goals Met:  Independence with exercise equipment Exercise tolerated well No report of concerns or symptoms today  Goals Unmet:  Not Applicable  Comments: Pt able to follow exercise prescription today without complaint.  Will continue to monitor for progression.    Dr. Firman Hughes is Medical Director for [facility] Cardiac Rehabilitation.  Dr. Fuad Aleskerov is Medical Director for [facility] Pulmonary Rehabilitation.

## 2023-10-03 ENCOUNTER — Encounter

## 2023-10-05 ENCOUNTER — Encounter: Admitting: *Deleted

## 2023-10-05 DIAGNOSIS — Z951 Presence of aortocoronary bypass graft: Secondary | ICD-10-CM

## 2023-10-05 DIAGNOSIS — Z48812 Encounter for surgical aftercare following surgery on the circulatory system: Secondary | ICD-10-CM | POA: Diagnosis not present

## 2023-10-05 NOTE — Progress Notes (Signed)
 Daily Session Note  Patient Details  Name: Kirk Russell MRN: [id number] Date of Birth: 06-18-1945 Referring Provider:   Flowsheet Row Cardiac Rehab from 07/11/2023 in [facility] Cardiac and Pulmonary Rehab  Referring Provider Burney Carter, MD    Encounter Date: 10/05/2023  Check In:  Session Check In - 10/05/23 0940       Check-In   Supervising physician immediately available to respond to emergencies See telemetry face sheet for immediately available ER MD    Location [facility]-Cardiac & Pulmonary Rehab    Staff Present Maud Sorenson, RN, BSN, CCRP;Meredith Manson Seitz RN,BSN;Maxon [place] BS, Exercise Physiologist;Noah Tickle, BS, Exercise Physiologist    Virtual Visit No    Medication changes reported     No    Fall or balance concerns reported    No    Warm-up and Cool-down Performed on first and last piece of equipment    Resistance Training Performed Yes    VAD Patient? No    PAD/SET Patient? No      Pain Assessment   Currently in Pain? No/denies             Social History   Tobacco Use  Smoking Status Former   Current packs/day: 0.00   Types: Cigarettes   Quit date: 1990   Years since quitting: 35.4  Smokeless Tobacco Former   Quit date: 04/24/1988    Goals Met:  Independence with exercise equipment Exercise tolerated well No report of concerns or symptoms today  Goals Unmet:  Not Applicable  Comments: Pt able to follow exercise prescription today without complaint.  Will continue to monitor for progression.    Dr. Firman Hughes is Medical Director for [facility] Cardiac Rehabilitation.  Dr. Fuad Aleskerov is Medical Director for [facility] Pulmonary Rehabilitation.

## 2023-10-08 ENCOUNTER — Encounter: Admitting: *Deleted

## 2023-10-08 DIAGNOSIS — Z48812 Encounter for surgical aftercare following surgery on the circulatory system: Secondary | ICD-10-CM | POA: Diagnosis not present

## 2023-10-08 DIAGNOSIS — Z951 Presence of aortocoronary bypass graft: Secondary | ICD-10-CM

## 2023-10-08 NOTE — Progress Notes (Signed)
 Daily Session Note  Patient Details  Name: Kirk Russell MRN: [id number] Date of Birth: Feb 14, 1946 Referring Provider:   Flowsheet Row Cardiac Rehab from 07/11/2023 in [facility] Cardiac and Pulmonary Rehab  Referring Provider Burney Carter, MD    Encounter Date: 10/08/2023  Check In:  Session Check In - 10/08/23 0941       Check-In   Supervising physician immediately available to respond to emergencies See telemetry face sheet for immediately available ER MD    Location [facility]-Cardiac & Pulmonary Rehab    Staff Present Maud Sorenson, RN, BSN, CCRP;Joseph Hood RCP,RRT,BSRT;Maxon [place] BS, Exercise Physiologist;Kelly [employer], ACSM CEP, Exercise Physiologist    Virtual Visit No    Medication changes reported     No    Fall or balance concerns reported    No    Warm-up and Cool-down Performed on first and last piece of equipment    Resistance Training Performed Yes    VAD Patient? No    PAD/SET Patient? No      Pain Assessment   Currently in Pain? No/denies             Social History   Tobacco Use  Smoking Status Former   Current packs/day: 0.00   Types: Cigarettes   Quit date: 1990   Years since quitting: 35.4  Smokeless Tobacco Former   Quit date: 04/24/1988    Goals Met:  Independence with exercise equipment Exercise tolerated well No report of concerns or symptoms today  Goals Unmet:  Not Applicable  Comments: Pt able to follow exercise prescription today without complaint.  Will continue to monitor for progression.    Dr. Firman Hughes is Medical Director for [facility] Cardiac Rehabilitation.  Dr. Fuad Aleskerov is Medical Director for [facility] Pulmonary Rehabilitation.

## 2023-10-10 ENCOUNTER — Encounter: Admitting: *Deleted

## 2023-10-10 DIAGNOSIS — Z951 Presence of aortocoronary bypass graft: Secondary | ICD-10-CM

## 2023-10-10 DIAGNOSIS — Z48812 Encounter for surgical aftercare following surgery on the circulatory system: Secondary | ICD-10-CM | POA: Diagnosis not present

## 2023-10-10 NOTE — Progress Notes (Signed)
 Daily Session Note  Patient Details  Name: Kirk Russell MRN: [id number] Date of Birth: 12-09-1945 Referring Provider:   Flowsheet Row Cardiac Rehab from 07/11/2023 in [facility] Cardiac and Pulmonary Rehab  Referring Provider Burney Carter, MD    Encounter Date: 10/10/2023  Check In:  Session Check In - 10/10/23 1009       Check-In   Supervising physician immediately available to respond to emergencies See telemetry face sheet for immediately available ER MD    Location [facility]-Cardiac & Pulmonary Rehab    Staff Present Maud Sorenson, RN, BSN, CCRP;Joseph Hood RCP,RRT,BSRT;Maxon [place] BS, Exercise Physiologist;Jason Martina Sledge RDN,LDN    Virtual Visit No    Medication changes reported     No    Fall or balance concerns reported    No    Warm-up and Cool-down Performed on first and last piece of equipment    Resistance Training Performed Yes    VAD Patient? No    PAD/SET Patient? No      Pain Assessment   Currently in Pain? No/denies             Social History   Tobacco Use  Smoking Status Former   Current packs/day: 0.00   Types: Cigarettes   Quit date: 1990   Years since quitting: 35.4  Smokeless Tobacco Former   Quit date: 04/24/1988    Goals Met:  Independence with exercise equipment Exercise tolerated well No report of concerns or symptoms today  Goals Unmet:  Not Applicable  Comments: Pt able to follow exercise prescription today without complaint.  Will continue to monitor for progression.    Dr. Firman Hughes is Medical Director for [facility] Cardiac Rehabilitation.  Dr. Fuad Aleskerov is Medical Director for [facility] Pulmonary Rehabilitation.

## 2023-10-12 ENCOUNTER — Encounter: Admitting: *Deleted

## 2023-10-12 DIAGNOSIS — Z48812 Encounter for surgical aftercare following surgery on the circulatory system: Secondary | ICD-10-CM | POA: Diagnosis not present

## 2023-10-12 DIAGNOSIS — Z951 Presence of aortocoronary bypass graft: Secondary | ICD-10-CM

## 2023-10-12 NOTE — Progress Notes (Signed)
 Daily Session Note  Patient Details  Name: Kirk Russell MRN: [id number] Date of Birth: Jan 04, 1946 Referring Provider:   Flowsheet Row Cardiac Rehab from 07/11/2023 in [facility] Cardiac and Pulmonary Rehab  Referring Provider Burney Carter, MD    Encounter Date: 10/12/2023  Check In:  Session Check In - 10/12/23 0918       Check-In   Supervising physician immediately available to respond to emergencies See telemetry face sheet for immediately available ER MD    Location [facility]-Cardiac & Pulmonary Rehab    Staff Present Maud Sorenson, RN, BSN, CCRP;Joseph Hood RCP,RRT,BSRT;Maxon [place] BS, Exercise Physiologist;Noah Tickle, BS, Exercise Physiologist    Virtual Visit No    Medication changes reported     No    Fall or balance concerns reported    No    Warm-up and Cool-down Performed on first and last piece of equipment    Resistance Training Performed Yes    VAD Patient? No    PAD/SET Patient? No      Pain Assessment   Currently in Pain? No/denies             Social History   Tobacco Use  Smoking Status Former   Current packs/day: 0.00   Types: Cigarettes   Quit date: 1990   Years since quitting: 35.4  Smokeless Tobacco Former   Quit date: 04/24/1988    Goals Met:  Independence with exercise equipment Exercise tolerated well No report of concerns or symptoms today  Goals Unmet:  Not Applicable  Comments: Pt able to follow exercise prescription today without complaint.    Lorie graduated today from  rehab with 35 sessions completed.  Details of the patient's exercise prescription and what He needs to do in order to continue the prescription and progress were discussed with patient.  Patient was given a copy of prescription and goals.  Patient verbalized understanding. Ac plans to continue to exercise by walking at home.   Dr. Firman Hughes is Medical Director for [facility] Cardiac Rehabilitation.  Dr. Fuad Aleskerov is Medical Director for [facility]  Pulmonary Rehabilitation.

## 2023-10-12 NOTE — Progress Notes (Signed)
 Discharge Note for  Kirk Russell     1946-04-10        Kirk Russell graduated today from  rehab with 35 sessions completed.  Details of the patient's exercise prescription and what He needs to do in order to continue the prescription and progress were discussed with patient.  Patient was given a copy of prescription and goals.  Patient verbalized understanding. Kirk Russell plans to continue to exercise by walking at home.     6 Minute Walk     Row Name 07/11/23 1141 09/28/23 0929       6 Minute Walk   Phase Initial Discharge    Distance 1135 feet 1245 feet    Distance % Change -- 9.7 %    Distance Feet Change -- 110 ft    Walk Time 6 minutes 6 minutes    # of Rest Breaks 0 0    MPH 2.15 2.36    METS 2.04 2.44    RPE 9 9    Perceived Dyspnea  0 2    VO2 Peak 7.14 8.52    Symptoms No Yes (comment)    Comments -- SOB    Resting HR 67 bpm 72 bpm    Resting BP 110/70 126/72    Resting Oxygen Saturation  98 % 94 %    Exercise Oxygen Saturation  during 6 min walk 96 % 92 %    Max Ex. HR 94 bpm 105 bpm    Max Ex. BP 130/70 146/74    2 Minute Post BP 110/72 --

## 2023-10-12 NOTE — Progress Notes (Signed)
 Cardiac Individual Treatment Plan  Patient Details  Name: Kirk Russell MRN: [id number] Date of Birth: [birth year] Referring Provider:   Flowsheet Row Cardiac Rehab from 07/11/2023 in [facility] Cardiac and Pulmonary Rehab  Referring Provider Burney Carter, MD    Initial Encounter Date:  Flowsheet Row Cardiac Rehab from 07/11/2023 in [facility] Cardiac and Pulmonary Rehab  Date 07/11/23    Visit Diagnosis: S/P CABG x 3  Patient's Home Medications on Admission:  Current Outpatient Medications:    albuterol  (VENTOLIN  HFA) 108 (90 Base) MCG/ACT inhaler, Inhale into the lungs every 6 (six) hours as needed for wheezing or shortness of breath., Disp: , Rfl:    alfuzosin  (UROXATRAL ) 10 MG 24 hr tablet, Take 1 tablet (10 mg total) by mouth daily with breakfast., Disp: 90 tablet, Rfl: 3   aspirin  EC 81 MG tablet, Take 81 mg by mouth daily., Disp: , Rfl:    citalopram  (CELEXA ) 10 MG tablet, Take 1 tablet by mouth daily., Disp: , Rfl:    dorzolamide -timolol  (COSOPT ) 22.3-6.8 MG/ML ophthalmic solution, 1 drop 2 (two) times daily., Disp: , Rfl:    latanoprost  (XALATAN ) 0.005 % ophthalmic solution, 1 drop at bedtime., Disp: , Rfl:    nitroGLYCERIN  (NITROSTAT ) 0.4 MG SL tablet, Place 0.4 mg under the tongue every 5 (five) minutes as needed for chest pain., Disp: , Rfl:    rosuvastatin  (CRESTOR ) 40 MG tablet, Take 1 tablet (40 mg total) by mouth daily., Disp: 30 tablet, Rfl: 0  Past 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 ears      Tobacco Use: Social History   Tobacco Use  Smoking Status Former   Current packs/day: 0.00   Types: Cigarettes   Quit date: 1990   Years since quitting: 35.4  Smokeless Tobacco Former   Quit date: 04/24/1988    Labs: Review Flowsheet       Latest Ref Rng & Units 12/14/2022 12/24/2022  Labs for ITP Cardiac and Pulmonary Rehab  Cholestrol 0 - 200 mg/dL - 409   LDL (calc) 0 - 99 mg/dL - 75   HDL-C >81 mg/dL - 51   Trlycerides <191 mg/dL - 87   Hemoglobin Y7W 4.8 - 5.6 % 6.1  -     Exercise Target Goals: Exercise Program Goal: Individual exercise prescription set using results from initial 6 min walk test and THRR while considering  patient's activity barriers and safety.   Exercise Prescription Goal: Initial exercise prescription builds to 30-45 minutes a day of aerobic activity, 2-3 days per week.  Home exercise guidelines will be given to patient during program as part of exercise prescription that the participant will acknowledge.   Education: Aerobic Exercise: - Group verbal and visual presentation on the components of exercise prescription. Introduces F.I.T.T principle from ACSM for exercise prescriptions.  Reviews F.I.T.T. principles of aerobic exercise including progression. Written material given at graduation. Flowsheet Row Cardiac Rehab  from 08/29/2023 in [facility] Cardiac and Pulmonary Rehab  Education need identified 07/11/23    Education: Resistance Exercise: - Group verbal and visual presentation on the components of exercise prescription. Introduces F.I.T.T principle from ACSM for exercise prescriptions  Reviews F.I.T.T. principles of resistance exercise including progression. Written material given at graduation.    Education: Exercise & Equipment Safety: - Individual verbal instruction and demonstration of equipment use and safety with use of the equipment. Flowsheet Row Cardiac Rehab from 08/29/2023 in [facility] Cardiac and Pulmonary Rehab  Date 07/11/23  Educator  MB  Instruction Review Code 1- Verbalizes Understanding    Education: Exercise Physiology & General Exercise Guidelines: - Group verbal and written instruction with models to review the exercise physiology of the cardiovascular system and associated critical values. Provides general exercise guidelines with specific guidelines to those with heart or lung disease.    Education: Flexibility, Balance, Mind/Body Relaxation: - Group verbal and visual presentation with interactive activity on the components of exercise prescription. Introduces F.I.T.T principle from ACSM for exercise prescriptions. Reviews F.I.T.T. principles of flexibility and balance exercise training including progression. Also discusses the mind body connection.  Reviews various relaxation techniques to help reduce and manage stress (i.e. Deep breathing, progressive muscle relaxation, and visualization). Balance handout provided to take home. Written material given at graduation.   Activity Barriers & Risk Stratification:  Activity Barriers & Cardiac Risk Stratification - 07/11/23 1142       Activity Barriers & Cardiac Risk Stratification   Activity Barriers Shortness of Breath;Other (comment)    Comments Neuropathy    Cardiac Risk Stratification High          6 Minute Walk:  6 Minute Walk     Row Name 07/11/23 1141 09/28/23 0929       6 Minute Walk   Phase Initial Discharge    Distance 1135 feet 1245 feet    Distance % Change -- 9.7 %    Distance Feet Change -- 110 ft    Walk Time 6 minutes 6 minutes    # of Rest Breaks 0 0    MPH 2.15 2.36    METS 2.04 2.44    RPE 9 9    Perceived Dyspnea  0 2    VO2 Peak 7.14 8.52    Symptoms No Yes (comment)    Comments -- SOB    Resting HR 67 bpm 72 bpm    Resting BP 110/70 126/72    Resting Oxygen Saturation  98 % 94 %    Exercise Oxygen Saturation  during 6 min walk 96 % 92 %    Max Ex. HR 94 bpm 105 bpm    Max Ex. BP 130/70 146/74    2 Minute Post BP 110/72 --        Oxygen Initial Assessment:   Oxygen Re-Evaluation:   Oxygen Discharge (Final Oxygen Re-Evaluation):   Initial Exercise Prescription:  Initial Exercise Prescription - 07/11/23 1100       Date of Initial Exercise RX and Referring Provider   Date 07/11/23    Referring Provider Burney Carter, MD      Oxygen   Maintain Oxygen Saturation 88% or higher      Treadmill   MPH 1.8    Grade 0    Minutes 15    METs 2.38      Recumbant Bike   Level 2    RPM 50    Watts 15    Minutes 15  METs 2.04      NuStep   Level 2    SPM 80    Minutes 15    METs 2.04      REL-XR   Level 1    Speed 50    Minutes 15    METs 2.04      T5 Nustep   Level 2   T6   SPM 80    Minutes 15    METs 2.04      Prescription Details   Frequency (times per week) 3    Duration Progress to 30 minutes of continuous aerobic without signs/symptoms of physical distress      Intensity   THRR 40-80% of Max Heartrate 97-127    Ratings of Perceived Exertion 11-13    Perceived Dyspnea 0-4      Progression   Progression Continue to progress workloads to maintain intensity without signs/symptoms of physical distress.      Resistance Training   Training Prescription Yes    Weight 7 lb    Reps 10-15          Perform Capillary Blood Glucose checks as needed.  Exercise Prescription Changes:   Exercise Prescription Changes     Row Name 07/11/23 1100 07/25/23 1100 08/09/23 0800 08/23/23 0800 09/04/23 1400     Response to Exercise   Blood Pressure (Admit) 110/70 118/64 122/60 112/70 110/68   Blood Pressure (Exercise) 130/70 124/70 150/88 142/82 --   Blood Pressure (Exit) 110/72 102/68 112/70 100/60 112/62   Heart Rate (Admit) 67 bpm 77 bpm 63 bpm 89 bpm 64 bpm   Heart Rate (Exercise) 94 bpm 108 bpm 114 bpm 115 bpm 114 bpm   Heart Rate (Exit) 67 bpm 74 bpm 85 bpm 77 bpm 87 bpm   Oxygen Saturation (Admit) 98 % -- -- -- --   Oxygen Saturation (Exercise) 96 % -- -- -- --   Oxygen  Saturation (Exit) 98 % -- -- -- --   Rating of Perceived Exertion (Exercise) 9 13 13 15 13    Perceived Dyspnea (Exercise) 0 0 -- -- --   Symptoms none none none none none   Comments results first 2 weeks of exercise -- -- --   Duration Progress to 30 minutes of  aerobic without signs/symptoms of physical distress Progress to 30 minutes of  aerobic without signs/symptoms of physical distress Continue with 30 min of aerobic exercise without signs/symptoms of physical distress. Continue with 30 min of aerobic exercise without signs/symptoms of physical distress. Continue with 30 min of aerobic exercise without signs/symptoms of physical distress.   Intensity THRR New THRR New THRR unchanged THRR unchanged THRR unchanged     Progression   Progression Continue to progress workloads to maintain intensity without signs/symptoms of physical distress. Continue to progress workloads to maintain intensity without signs/symptoms of physical distress. Continue to progress workloads to maintain intensity without signs/symptoms of physical distress. Continue to progress workloads to maintain intensity without signs/symptoms of physical distress. Continue to progress workloads to maintain intensity without signs/symptoms of physical distress.   Average METs 2.04 3.12 2.56 2.82 2.74     Resistance Training   Training Prescription -- Yes Yes Yes Yes   Weight -- 7 lb 7 lb 7 lb 7 lb   Reps -- 10-15 10-15 10-15 10-15     Interval Training   Interval Training -- No No No No     Treadmill   MPH -- 1.9 1.9 2.4 2.1  Grade -- 0 0 0 0.5   Minutes -- 15 15 15 15    METs -- 2.45 2.45 2.84 2.75     Recumbant Bike   Level -- -- 6 4 --   Watts -- -- 33 29 --   Minutes -- -- 15 15 --   METs -- -- 3.13 2.98 --     NuStep   Level -- -- 2 4 3    Minutes -- -- 15 15 15    METs -- -- -- 2.9 2.8     REL-XR   Level -- 2 3 3 3    Minutes -- 15 15 15 15    METs -- 3.9 3.4 3.6 3.7     T5 Nustep   Level -- -- 2  T6  1 2  T6   Minutes -- -- 15 15 15    METs -- -- 1.5 2.1 2.1     Track   Laps -- 42 32 35 32   Minutes -- 15 15 15 15    METs -- 3.28 2.74 2.9 2.74     Oxygen   Maintain Oxygen Saturation -- 88% or higher 88% or higher 88% or higher 88% or higher    Row Name 09/20/23 1000 09/28/23 0900 10/02/23 1400         Response to Exercise   Blood Pressure (Admit) 102/76 -- 126/72     Blood Pressure (Exercise) 132/60 -- --     Blood Pressure (Exit) 104/64 -- 112/60     Heart Rate (Admit) 66 bpm -- 72 bpm     Heart Rate (Exercise) 118 bpm -- 119 bpm     Heart Rate (Exit) 86 bpm -- 82 bpm     Rating of Perceived Exertion (Exercise) 13 -- 13     Symptoms none -- none     Duration Continue with 30 min of aerobic exercise without signs/symptoms of physical distress. Continue with 30 min of aerobic exercise without signs/symptoms of physical distress. Continue with 30 min of aerobic exercise without signs/symptoms of physical distress.     Intensity THRR unchanged THRR unchanged THRR unchanged       Progression   Progression Continue to progress workloads to maintain intensity without signs/symptoms of physical distress. Continue to progress workloads to maintain intensity without signs/symptoms of physical distress. Continue to progress workloads to maintain intensity without signs/symptoms of physical distress.     Average METs 2.88 2.88 3.05       Resistance Training   Training Prescription Yes Yes Yes     Weight 7 lb 7 lb 7 lb     Reps 10-15 10-15 10-15       Interval Training   Interval Training No No No       Treadmill   MPH 2.4 2.4 2.1     Grade -- -- 2     Minutes 15 15 15      METs 2.84 2.84 3.19       NuStep   Level 5 5 4      Minutes 15 15 15      METs 3.7 3.7 3       REL-XR   Level 3 3 1      Minutes 15 15 15      METs 3.6 3.6 3.1       T5 Nustep   Level 3  T6 3  T6 --     Minutes 15 15 --     METs 2.1 2.1 --  Track   Laps 32 32 --     Minutes 15 15 --     METs  2.74 2.74 --       Home Exercise Plan   Plans to continue exercise at -- Home (comment)  walking Home (comment)  walking     Frequency -- Add 2 additional days to program exercise sessions. Add 2 additional days to program exercise sessions.     Initial Home Exercises Provided -- 09/28/23 09/28/23       Oxygen   Maintain Oxygen Saturation 88% or higher 88% or higher 88% or higher        Exercise Comments:   Exercise Comments     Row Name 07/16/23 0956 10/12/23 4098         Exercise Comments First full day of exercise!  Patient was oriented to gym and equipment including functions, settings, policies, and procedures.  Patient's individual exercise prescription and treatment plan were reviewed.  All starting workloads were established based on the results of the 6 minute walk test done at initial orientation visit.  The plan for exercise progression was also introduced and progression will be customized based on patient's performance and goals. Reshard graduated today from  rehab with 35 sessions completed.  Details of the patient's exercise prescription and what He needs to do in order to continue the prescription and progress were discussed with patient.  Patient was given a copy of prescription and goals.  Patient verbalized understanding. Danner plans to continue to exercise by walking at home.         Exercise Goals and Review:   Exercise Goals     Row Name 07/11/23 1147             Exercise Goals   Increase Physical Activity Yes       Intervention Provide advice, education, support and counseling about physical activity/exercise needs.;Develop an individualized exercise prescription for aerobic and resistive training based on initial evaluation findings, risk stratification, comorbidities and participant's personal goals.       Expected Outcomes Short Term: Attend rehab on a regular basis to increase amount of physical activity.;Long Term: Add in home exercise to make  exercise part of routine and to increase amount of physical activity.;Long Term: Exercising regularly at least 3-5 days a week.       Increase Strength and Stamina Yes       Intervention Provide advice, education, support and counseling about physical activity/exercise needs.;Develop an individualized exercise prescription for aerobic and resistive training based on initial evaluation findings, risk stratification, comorbidities and participant's personal goals.       Expected Outcomes Short Term: Increase workloads from initial exercise prescription for resistance, speed, and METs.;Short Term: Perform resistance training exercises routinely during rehab and add in resistance training at home;Long Term: Improve cardiorespiratory fitness, muscular endurance and strength as measured by increased METs and functional capacity ( )       Able to understand and use rate of perceived exertion (RPE) scale Yes       Intervention Provide education and explanation on how to use RPE scale       Expected Outcomes Short Term: Able to use RPE daily in rehab to express subjective intensity level;Long Term:  Able to use RPE to guide intensity level when exercising independently       Able to understand and use Dyspnea scale Yes       Intervention Provide education and explanation on how to use Dyspnea scale  Expected Outcomes Short Term: Able to use Dyspnea scale daily in rehab to express subjective sense of shortness of breath during exertion;Long Term: Able to use Dyspnea scale to guide intensity level when exercising independently       Knowledge and understanding of Target Heart Rate Range (THRR) Yes       Intervention Provide education and explanation of THRR including how the numbers were predicted and where they are located for reference       Expected Outcomes Short Term: Able to state/look up THRR;Short Term: Able to use daily as guideline for intensity in rehab;Long Term: Able to use THRR to govern  intensity when exercising independently       Able to check pulse independently Yes       Intervention Provide education and demonstration on how to check pulse in carotid and radial arteries.;Review the importance of being able to check your own pulse for safety during independent exercise       Expected Outcomes Short Term: Able to explain why pulse checking is important during independent exercise;Long Term: Able to check pulse independently and accurately       Understanding of Exercise Prescription Yes       Intervention Provide education, explanation, and written materials on patient's individual exercise prescription       Expected Outcomes Short Term: Able to explain program exercise prescription;Long Term: Able to explain home exercise prescription to exercise independently          Exercise Goals Re-Evaluation :  Exercise Goals Re-Evaluation     Row Name 07/16/23 [phone] 07/25/23 1147 08/09/23 0812 08/23/23 0858 09/04/23 1454     Exercise Goal Re-Evaluation   Exercise Goals Review Able to understand and use rate of perceived exertion (RPE) scale;Able to understand and use Dyspnea scale;Knowledge and understanding of Target Heart Rate Range (THRR);Understanding of Exercise Prescription Increase Physical Activity;Increase Strength and Stamina;Understanding of Exercise Prescription Increase Physical Activity;Increase Strength and Stamina;Understanding of Exercise Prescription Increase Physical Activity;Increase Strength and Stamina;Understanding of Exercise Prescription Increase Physical Activity;Increase Strength and Stamina;Understanding of Exercise Prescription   Comments Reviewed RPE and dyspnea scale, THR and program prescription with pt today.  Pt voiced understanding and was given a copy of goals to take home. Raenette Bumps is off to a good start in the program. He was able to use the Treadmill, XR, and track during his first 3 sessions. He was able to increase to a speed of 1. on the  treadmill, and increase to level 2 on the XR. We will continue to monitor his progress in the program. Nadean August is doing well in rehab. He was able to increase to level 6 on the recumbent bike from level 3 and increased to level 3 on the XR. He maintained level 2 on the T4 and T6 nusteps. He also maintained his workload on the treadmill with a speed of 1.9 mph with no incline. He decreased his number of laps on the track to 32 from 42. We will continue to monitor his progress in the program. Nadean August continues to do well in rehab. He was able to increase his speed on the treadmill from 1.9 to 2.23mph. He was also able to increase from level 2 to 4 on the T4 nustep. We will continue to monitor his progress in the program. Nadean August continues to do well in rehab. He increased his incline on the treadmill to 0.5% with a speed of 2.4 mph. He maintained level 3 on the XR. He decreased down to  32 laps from 35 on the track and down to level 3 on the T4 nustep from 4. We will continue to monitor his progress in the program.   Expected Outcomes Short: Use RPE daily to regulate intensity. Long: Follow program prescription in THR. Short: Continue to increase treadmill workload. Long: Continue exercise to improve strength and stamina. Short: Try to increase back to 42 laps on the track. Long: Continue exercise to improve strength and stamina. Short: Continue to increase treadmill wokrloads. Long: Continue exercise to improve strength and stamina. Short: Increase back up to level 4 on the T4 nustep and 35 laps on the track. Long: Continue exercise to improve strength and stamina.    Row Name 09/20/23 1103 09/28/23 0953 10/02/23 1456         Exercise Goal Re-Evaluation   Exercise Goals Review Increase Physical Activity;Increase Strength and Stamina;Understanding of Exercise Prescription Increase Physical Activity;Able to understand and use rate of perceived exertion (RPE) scale;Knowledge and understanding of Target Heart Rate Range  (THRR);Understanding of Exercise Prescription;Increase Strength and Stamina;Able to understand and use Dyspnea scale;Able to check pulse independently Increase Physical Activity;Increase Strength and Stamina;Understanding of Exercise Prescription     Comments Nadean August is doing well in rehab. He was recently able to increase from level 2 to 3 on the T6 nustep, as well as increase from level 3 to 5 on the T4 nustep. He has been able to maintain 32 laps on the track in 15 minutes. We will continue to monitor his progress in the program. Reviewed home exercise with pt today.  Pt plans to walk at home or the mall for exercise.  Reviewed THR, pulse, RPE, sign and symptoms, pulse oximetery and when to call 911 or MD.  Also discussed weather considerations and indoor options.  Pt voiced understanding. Nadean August is doing well in rehab and is close to graduating from the program. He recently completed his post and improved by 9.7%. He also increased his treadmill workload to an incline of 2% while maintaining his speed at 2.1 mph. We will continue to monitor his progress until he graduates from the program.     Expected Outcomes Short: Continue to increase nustep workloads. Long: Continue exercise to improve strength and stamina. Short: add1-2 days a week of exercise at home on off days of rehab. Long: become independent with exercise routine upon graduation from cardiac rehab. Short: Graduate. Long: Continue to exercise independently.        Discharge Exercise Prescription (Final Exercise Prescription Changes):  Exercise Prescription Changes - 10/02/23 1400       Response to Exercise   Blood Pressure (Admit) 126/72    Blood Pressure (Exit) 112/60    Heart Rate (Admit) 72 bpm    Heart Rate (Exercise) 119 bpm    Heart Rate (Exit) 82 bpm    Rating of Perceived Exertion (Exercise) 13    Symptoms none    Duration Continue with 30 min of aerobic exercise without signs/symptoms of physical distress.    Intensity  THRR unchanged      Progression   Progression Continue to progress workloads to maintain intensity without signs/symptoms of physical distress.    Average METs 3.05      Resistance Training   Training Prescription Yes    Weight 7 lb    Reps 10-15      Interval Training   Interval Training No      Treadmill   MPH 2.1    Grade 2  Minutes 15    METs 3.19      NuStep   Level 4    Minutes 15    METs 3      REL-XR   Level 1    Minutes 15    METs 3.1      Home Exercise Plan   Plans to continue exercise at Home (comment)   walking   Frequency Add 2 additional days to program exercise sessions.    Initial Home Exercises Provided 09/28/23      Oxygen   Maintain Oxygen Saturation 88% or higher          Nutrition:  Target Goals: Understanding of nutrition guidelines, daily intake of sodium 1500mg , cholesterol 200mg , calories 30% from fat and 7% or less from saturated fats, daily to have 5 or more servings of fruits and vegetables.  Education: All About Nutrition: -Group instruction provided by verbal, written material, interactive activities, discussions, models, and posters to present general guidelines for heart healthy nutrition including fat, fiber, MyPlate, the role of sodium in heart healthy nutrition, utilization of the nutrition label, and utilization of this knowledge for meal planning. Follow up email sent as well. Written material given at graduation. Flowsheet Row Cardiac Rehab from 08/29/2023 in [facility] Cardiac and Pulmonary Rehab  Education need identified 07/11/23  Date 07/18/23  Educator JG  Instruction Review Code 1- Verbalizes Understanding    Biometrics:  Pre Biometrics - 07/11/23 1149       Pre Biometrics   Height 5' 8.9 (1.75 m)    Weight 202 lb 12.8 oz (92 kg)    Waist Circumference 43 inches    Hip Circumference 44.5 inches    Waist to Hip Ratio 0.97 %    BMI (Calculated) 30.04    Single Leg Stand 3 seconds          Post Biometrics -  09/28/23 0930        Post  Biometrics   Height 5' 8.9 (1.75 m)    Weight 205 lb 11.2 oz (93.3 kg)    Waist Circumference 43 inches    Hip Circumference 44 inches    Waist to Hip Ratio 0.98 %    BMI (Calculated) 30.47    Single Leg Stand 5.9 seconds          Nutrition Therapy Plan and Nutrition Goals:  Nutrition Therapy & Goals - 10/01/23 1008       Nutrition Therapy   RD appointment deferred Yes      Personal Nutrition Goals   Nutrition Goal RD appointment deferred at this time          Nutrition Assessments:  MEDIFICTS Score Key: >=70 Need to make dietary changes  40-70 Heart Healthy Diet <= 40 Therapeutic Level Cholesterol Diet  Flowsheet Row Cardiac Rehab from 10/01/2023 in [facility] Cardiac and Pulmonary Rehab  Picture Your Plate Total Score on Admission 36  Picture Your Plate Total Score on Discharge 45   Picture Your Plate Scores: <16 Unhealthy dietary pattern with much room for improvement. 41-50 Dietary pattern unlikely to meet recommendations for good health and room for improvement. 51-60 More healthful dietary pattern, with some room for improvement.  >60 Healthy dietary pattern, although there may be some specific behaviors that could be improved.    Nutrition Goals Re-Evaluation:  Nutrition Goals Re-Evaluation     Row Name 08/22/23 0932 09/21/23 0934           Goals   Nutrition Goal RD appointment deferred  at this time RD appointment deferred at this time         Nutrition Goals Discharge (Final Nutrition Goals Re-Evaluation):  Nutrition Goals Re-Evaluation - 09/21/23 0934       Goals   Nutrition Goal RD appointment deferred at this time          Psychosocial: Target Goals: Acknowledge presence or absence of significant depression and/or stress, maximize coping skills, provide positive support system. Participant is able to verbalize types and ability to use techniques and skills needed for reducing stress and depression.   Education:  Stress, Anxiety, and Depression - Group verbal and visual presentation to define topics covered.  Reviews how body is impacted by stress, anxiety, and depression.  Also discusses healthy ways to reduce stress and to treat/manage anxiety and depression.  Written material given at graduation. Flowsheet Row Cardiac Rehab from 08/29/2023 in [facility] Cardiac and Pulmonary Rehab  Date 08/29/23  Educator [facility]  Instruction Review Code 1- [employer] Understanding    Education: Sleep Hygiene -Provides group verbal and written instruction about how sleep can affect your health.  Define sleep hygiene, discuss sleep cycles and impact of sleep habits. Review good sleep hygiene tips.    Initial Review & Psychosocial Screening:  Initial Psych Review & Screening - 07/05/23 1341       Initial Review   Current issues with Current Stress Concerns;Current Anxiety/Panic    Source of Stress Concerns Unable to perform yard/household activities    Comments Aniety over managing a large estate for sister,   cant get out golfing yet.      Family Dynamics   Good Support System? Yes   wife     Barriers   Psychosocial barriers to participate in program There are no identifiable barriers or psychosocial needs.      Screening Interventions   Interventions To provide support and resources with identified psychosocial needs;Provide feedback about the scores to participant;Encouraged to exercise    Expected Outcomes Short Term goal: Utilizing psychosocial counselor, staff and physician to assist with identification of specific Stressors or current issues interfering with healing process. Setting desired goal for each stressor or current issue identified.;Long Term Goal: Stressors or current issues are controlled or eliminated.;Short Term goal: Identification and review with participant of any Quality of Life or Depression concerns found by scoring the questionnaire.;Long Term goal: The participant improves quality of Life and PHQ9  Scores as seen by post scores and/or verbalization of changes          Quality of Life Scores:   Quality of Life - 10/02/23 0719       Quality of Life Scores   Health/Function Pre 17.43 %    Health/Function Post 24.07 %    Health/Function % Change 38.1 %    Socioeconomic Pre 23.83 %    Socioeconomic Post 24.5 %    Socioeconomic % Change  2.81 %    Psych/Spiritual Pre 22.71 %    Psych/Spiritual Post 23.14 %    Psych/Spiritual % Change 1.89 %    Family Pre 24.6 %    Family Post 25.2 %    Family % Change 2.44 %    GLOBAL Pre 20.8 %    GLOBAL Post 24.12 %    GLOBAL % Change 15.96 %         Scores of 19 and below usually indicate a poorer quality of life in these areas.  A difference of  2-3 points is a clinically meaningful difference.  A difference of 2-3 points in the total score of the Quality of Life Index has been associated with significant improvement in overall quality of life, self-image, physical symptoms, and general health in studies assessing change in quality of life.  PHQ-9: Review Flowsheet       10/02/2023 07/11/2023  Depression screen PHQ 2/9  Decreased Interest 0 1  Down, Depressed, Hopeless 1 0  PHQ - 2 Score 1 1  Altered sleeping 0 0  Tired, decreased energy 0 1  Change in appetite 0 0  Feeling bad or failure about yourself  0 0  Trouble concentrating 1 2  Moving slowly or fidgety/restless 1 0  Suicidal thoughts 0 0  PHQ-9 Score 3 4  Difficult doing work/chores Not difficult at all Not difficult at all   Interpretation of Total Score  Total Score Depression Severity:  1-4 = Minimal depression, 5-9 = Mild depression, 10-14 = Moderate depression, 15-19 = Moderately severe depression, 20-27 = Severe depression   Psychosocial Evaluation and Intervention:  Psychosocial Evaluation - 07/05/23 1357       Psychosocial Evaluation & Interventions   Interventions Encouraged to exercise with the program and follow exercise prescription    Comments There  are no barriers to his starting the program.  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  If I'm not on golfcourse then I am mowing.  He lives with his wife and she is his support.  He controls his diabetes with diet and exercise.  He would like to drop 15 pounds. He is rady to strt the program    Expected Outcomes STG attend all scheduled sessions, meet with the RD and work on exercise progression as advised by EP.   LTG aintain exercise progression and get back on the golf course.    Continue Psychosocial Services  Follow up required by staff          Psychosocial Re-Evaluation:  Psychosocial Re-Evaluation     Row Name 08/22/23 0933 09/21/23 0931 10/01/23 1005         Psychosocial Re-Evaluation   Current issues with Current Anxiety/Panic;Current Psychotropic Meds None Identified None Identified     Comments Bill takes some medication for anxiety. He feels when he gets out of his house it improves his mood. He has not had a history of depression and states that his mood change is new if he is in the house for to long. Nadean August is no longer taking any medication for anxiety. He states that he isnt having any stress concerns right now, and is leeping well. He looks forward to playing golf again this summer to help him deal with his stress. Nadean August states he is no longer feeling stressed or anxious. He has been back to going to the driving range and plans to play golf this afternoon with this grandson. He is happy that he is able to share his love for golf with his grandson. He is sleeping well. He states he is grateful for this program and is pleased with how much progress he has made. He plans ot carry this motivation with him as he exercises on his own at home.     Expected Outcomes Short: Continue to attend [facility] regularly for regular exercise and social engagement. Long: Continue to improve symptoms and manage a positive mental state. Short: Continue  to attend [facility] regularly for regular exercise and social engagement. Long: Continue to improve symptoms and manage  a positive mental state. Short: graduate from cardiac rehab. Long: independently manage positive self care habits.     Interventions Encouraged to attend Cardiac Rehabilitation for the exercise Encouraged to attend Cardiac Rehabilitation for the exercise Encouraged to attend Cardiac Rehabilitation for the exercise     Continue Psychosocial Services  Follow up required by staff Follow up required by staff No Follow up required        Psychosocial Discharge (Final Psychosocial Re-Evaluation):  Psychosocial Re-Evaluation - 10/01/23 1005       Psychosocial Re-Evaluation   Current issues with None Identified    Comments Nadean August states he is no longer feeling stressed or anxious. He has been back to going to the driving range and plans to play golf this afternoon with this grandson. He is happy that he is able to share his love for golf with his grandson. He is sleeping well. He states he is grateful for this program and is pleased with how much progress he has made. He plans ot carry this motivation with him as he exercises on his own at home.    Expected Outcomes Short: graduate from cardiac rehab. Long: independently manage positive self care habits.    Interventions Encouraged to attend Cardiac Rehabilitation for the exercise    Continue Psychosocial Services  No Follow up required          Vocational Rehabilitation: Provide vocational rehab assistance to qualifying candidates.   Vocational Rehab Evaluation & Intervention:  Vocational Rehab - 10/02/23 0718       Discharge Vocational Rehab   Discharge Vocational Rehabilitation no request for VR          Education: Education Goals: Education classes will be provided on a variety of topics geared toward better understanding of heart health and risk factor modification. Participant will state understanding/return  demonstration of topics presented as noted by education test scores.  Learning Barriers/Preferences:  Learning Barriers/Preferences - 07/05/23 1346       Learning Barriers/Preferences   Learning Barriers Hearing    Learning Preferences None          General Cardiac Education Topics:  AED/CPR: - Group verbal and written instruction with the use of models to demonstrate the basic use of the AED with the basic ABC's of resuscitation.   Anatomy and Cardiac Procedures: - Group verbal and visual presentation and models provide information about basic cardiac anatomy and function. Reviews the testing methods done to diagnose heart disease and the outcomes of the test results. Describes the treatment choices: Medical Management, Angioplasty, or Coronary Bypass Surgery for treating various heart conditions including Myocardial Infarction, Angina, Valve Disease, and Cardiac Arrhythmias.  Written material given at graduation. Flowsheet Row Cardiac Rehab from 08/29/2023 in [facility] Cardiac and Pulmonary Rehab  Education need identified 07/11/23    Medication Safety: - Group verbal and visual instruction to review commonly prescribed medications for heart and lung disease. Reviews the medication, class of the drug, and side effects. Includes the steps to properly store meds and maintain the prescription regimen.  Written material given at graduation.   Intimacy: - Group verbal instruction through game format to discuss how heart and lung disease can affect sexual intimacy. Written material given at graduation..   Know Your Numbers and Heart Failure: - Group verbal and visual instruction to discuss disease risk factors for cardiac and pulmonary disease and treatment options.  Reviews associated critical values for Overweight/Obesity, Hypertension, Cholesterol, and Diabetes.  Discusses basics of heart failure: signs/symptoms and  treatments.  Introduces Heart Failure Zone chart for action plan for heart  failure.  Written material given at graduation. Flowsheet Row Cardiac Rehab from 08/29/2023 in [facility] Cardiac and Pulmonary Rehab  Education need identified 07/11/23    Infection Prevention: - Provides verbal and written material to individual with discussion of infection control including proper hand washing and proper equipment cleaning during exercise session. Flowsheet Row Cardiac Rehab from 08/29/2023 in [facility] Cardiac and Pulmonary Rehab  Date 07/11/23  Educator MB  Instruction Review Code 1- Verbalizes Understanding    Falls Prevention: - Provides verbal and written material to individual with discussion of falls prevention and safety. Flowsheet Row Cardiac Rehab from 08/29/2023 in [facility] Cardiac and Pulmonary Rehab  Date 07/11/23  Educator MB  Instruction Review Code 1- Verbalizes Understanding    Other: -Provides group and verbal instruction on various topics (see comments)   Knowledge Questionnaire Score:  Knowledge Questionnaire Score - 10/02/23 0718       Knowledge Questionnaire Score   Pre Score 21/26    Post Score 16/26          Core Components/Risk Factors/Patient Goals at Admission:  Personal Goals and Risk Factors at Admission - 07/11/23 1154       Core Components/Risk Factors/Patient Goals on Admission    Weight Management Yes    Intervention Weight Management: Develop a combined nutrition and exercise program designed to reach desired caloric intake, while maintaining appropriate intake of nutrient and fiber, sodium and fats, and appropriate energy expenditure required for the weight goal.    Admit Weight 202 lb 12.8 oz (92 kg)    Goal Weight: Short Term 198 lb (89.8 kg)    Goal Weight: Long Term 185 lb (83.9 kg)    Expected Outcomes Short Term: Continue to assess and modify interventions until short term weight is achieved;Long Term: Adherence to nutrition and physical activity/exercise program aimed toward attainment of established weight goal;Weight Loss:  Understanding of general recommendations for a balanced deficit meal plan, which promotes 1-2 lb weight loss per week and includes a negative energy balance of [phone] kcal/d;Understanding recommendations for meals to include 15-35% energy as protein, 25-35% energy from fat, 35-60% energy from carbohydrates, less than 200mg  of dietary cholesterol, 20-35 gm of total fiber daily;Understanding of distribution of calorie intake throughout the day with the consumption of 4-5 meals/snacks    Improve shortness of breath with ADL's Yes    Intervention Provide education, individualized exercise plan and daily activity instruction to help decrease symptoms of SOB with activities of daily living.    Expected Outcomes Short Term: Improve cardiorespiratory fitness to achieve a reduction of symptoms when performing ADLs;Long Term: Be able to perform more ADLs without symptoms or delay the onset of symptoms    Diabetes Yes   Diet-controlled   Intervention Provide education about signs/symptoms and action to take for hypo/hyperglycemia.;Provide education about proper nutrition, including hydration, and aerobic/resistive exercise prescription along with prescribed medications to achieve blood glucose in normal ranges: Fasting glucose 65-99 mg/dL    Expected Outcomes Short Term: Participant verbalizes understanding of the signs/symptoms and immediate care of hyper/hypoglycemia, proper foot care and importance of medication, aerobic/resistive exercise and nutrition plan for blood glucose control.;Long Term: Attainment of HbA1C < 7%.    Hypertension Yes    Intervention Provide education on lifestyle modifcations including regular physical activity/exercise, weight management, moderate sodium restriction and increased consumption of fresh fruit, vegetables, and low fat dairy, alcohol moderation, and smoking cessation.;Monitor prescription use compliance.  Expected Outcomes Short Term: Continued assessment and intervention  until BP is < 140/74mm HG in hypertensive participants. < 130/26mm HG in hypertensive participants with diabetes, heart failure or chronic kidney disease.;Long Term: Maintenance of blood pressure at goal levels.    Lipids Yes    Intervention Provide education and support for participant on nutrition & aerobic/resistive exercise along with prescribed medications to achieve LDL 70mg , HDL >40mg .    Expected Outcomes Short Term: Participant states understanding of desired cholesterol values and is compliant with medications prescribed. Participant is following exercise prescription and nutrition guidelines.;Long Term: Cholesterol controlled with medications as prescribed, with individualized exercise RX and with personalized nutrition plan. Value goals: LDL < 70mg , HDL > 40 mg.          Education:Diabetes - Individual verbal and written instruction to review signs/symptoms of diabetes, desired ranges of glucose level fasting, after meals and with exercise. Acknowledge that pre and post exercise glucose checks will be done for 3 sessions at entry of program. Flowsheet Row Cardiac Rehab from 08/29/2023 in [facility] Cardiac [facility] Pulmonary Rehab  Date 07/11/23  Educator MB  Instruction Review Code 1- Verbalizes Understanding    Core Components/Risk Factors/Patient Goals Review:   Goals and Risk Factor Review     Row Name 08/22/23 [phone] 09/21/23 0934 10/01/23 1002         Core Components/Risk Factors/Patient Goals Review   Personal Goals Review Diabetes Diabetes Weight Management/Obesity;Hypertension;Lipids;Diabetes     Review Raenette Bumps states that his A1C is doing well and is checking his blood glucose at home. He has a beer every now and then oppsed to 3 a day. He has been making changes in his diet to help with his sugar. His morning blood sugar on average is 112. Nadean August states that he checks his blood sugar every other day. Nadean August states that he is still drinking 2 16oz miller light beers every night. He is  still managing his diet to keep his blood sugar within a healthy range. Nadean August has been checking his blood sugar about 3 times a week and his readings have been consistently within range. He checks his blood pressure at home a couple times during the week and those numbers have also been consistently within his normal. He has been taking his medication appropriately to help manage his lipids. His weight has been around 202 when he weighs at home. He is happy with his progress in the program and is planning on continuing managing his risk factors when he graduates soon     Expected Outcomes Short: continue checking blood sugars. Long: reduce A1C. Short: Continue to maintain diet, and manage blood sugar. Long: Reduce A1C Short: continue to check his blood pressure, blood sugar and take medication. Long: Independently manage risk factors        Core Components/Risk Factors/Patient Goals at Discharge (Final Review):   Goals and Risk Factor Review - 10/01/23 1002       Core Components/Risk Factors/Patient Goals Review   Personal Goals Review Weight Management/Obesity;Hypertension;Lipids;Diabetes    Review Nadean August has been checking his blood sugar about 3 times a week and his readings have been consistently within range. He checks his blood pressure at home a couple times during the week and those numbers have also been consistently within his normal. He has been taking his medication appropriately to help manage his lipids. His weight has been around 202 when he weighs at home. He is happy with his progress in the program and is planning on  continuing managing his risk factors when he graduates soon    Expected Outcomes Short: continue to check his blood pressure, blood sugar and take medication. Long: Independently manage risk factors          ITP Comments:  ITP Comments     Row Name 07/05/23 1357 07/11/23 1141 07/16/23 0956 08/01/23 0737 08/29/23 1255   ITP Comments Virtual orientation call completed  today. he has an appointment on Date: 07/11/2023  for EP eval and gym Orientation.  Documentation of diagnosis can be found in [facility] 06/27/2023 . Completed and gym orientation. Initial ITP created and sent for review to Dr. Firman Hughes, Medical Director. First full day of exercise!  Patient was oriented to gym and equipment including functions, settings, policies, and procedures.  Patient's individual exercise prescription and treatment plan were reviewed.  All starting workloads were established based on the results of the 6 minute walk test done at initial orientation visit.  The plan for exercise progression was also introduced and progression will be customized based on patient's performance and goals. 30 Day review completed. Medical Director ITP review done, changes made as directed, and signed approval by Medical Director.    new to program 30 Day review completed. Medical Director ITP review done, changes made as directed, and signed approval by Medical Director.    Row Name 09/26/23 0920 10/12/23 0922         ITP Comments 30 Day review completed. Medical Director ITP review done, changes made as directed, and signed approval by Medical Director. Orlan graduated today from  rehab with 35 sessions completed.  Details of the patient's exercise prescription and what He needs to do in order to continue the prescription and progress were discussed with patient.  Patient was given a copy of prescription and goals.  Patient verbalized understanding. Nicholad plans to continue to exercise by walking at home.         Comments: Discharge ITP

## 2023-10-15 ENCOUNTER — Ambulatory Visit

## 2023-10-17 IMAGING — CT CT CHEST W/ CM
2 of 3 series · 15 of 36 positions shown, 18 images · IV contrast (omnipaque)
Comparison: Chest CT 10/07/2020.

CLINICAL DATA: 75-year-old male with history of pulmonary nodule.
Followup study.

EXAM:
CT CHEST WITH CONTRAST
TECHNIQUE: Multidetector CT imaging of the chest was performed during
intravenous contrast administration.
CONTRAST:  75mL OMNIPAQUE IOHEXOL 300 MG/ML  SOLN

[Series 2: axial st · axial · 0.78mm/px · z∈[-358,-90]mm · 12 of 158 slices shown, 15 images]
[im 12/158  mediastinal]
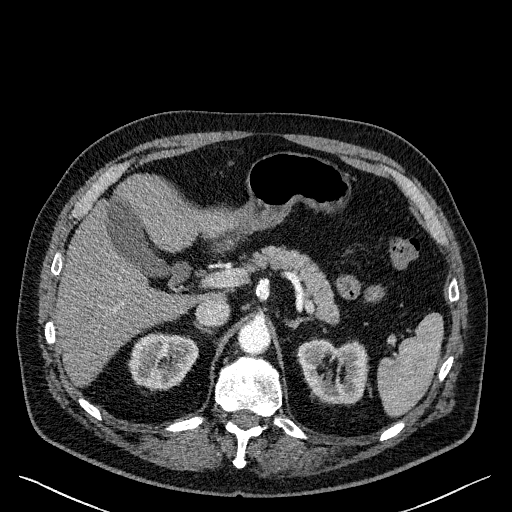
[im 12/158  lung]
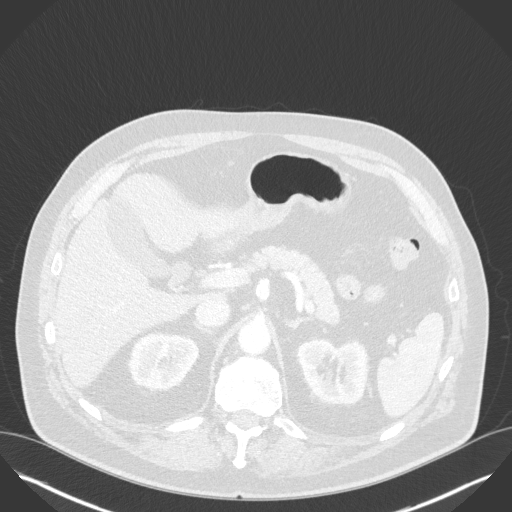
[im 24/158  lung]
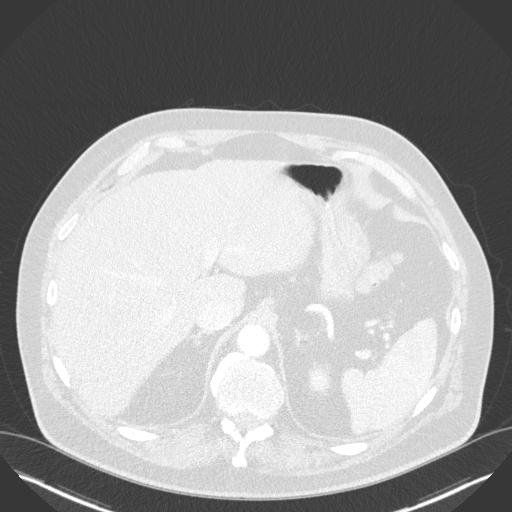
[im 35/158  lung]
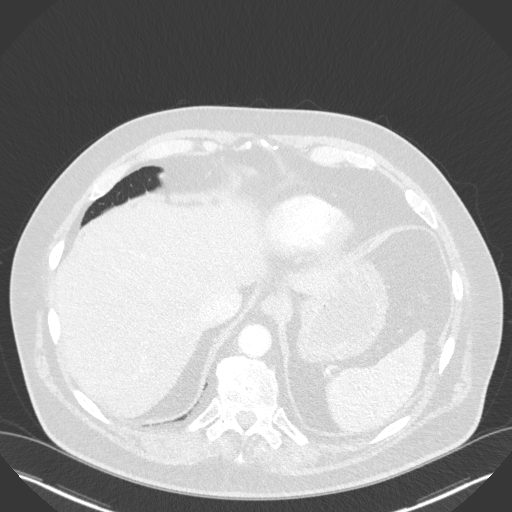
[im 47/158  lung]
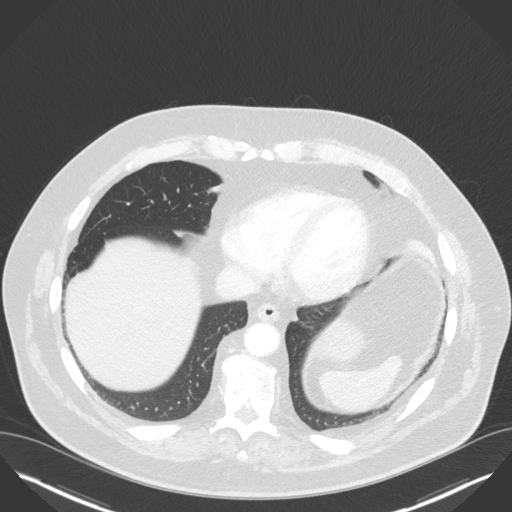
[im 59/158  mediastinal]
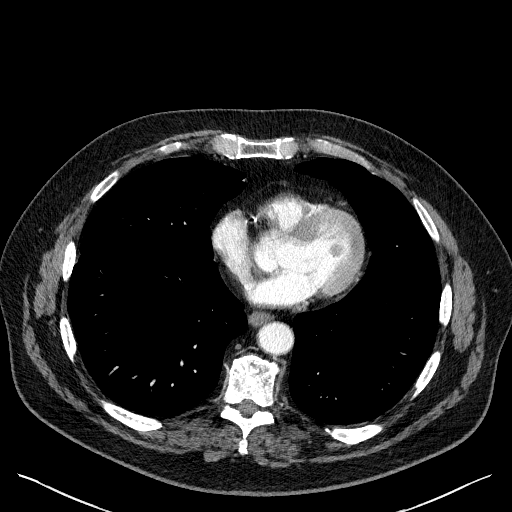
[im 59/158  lung]
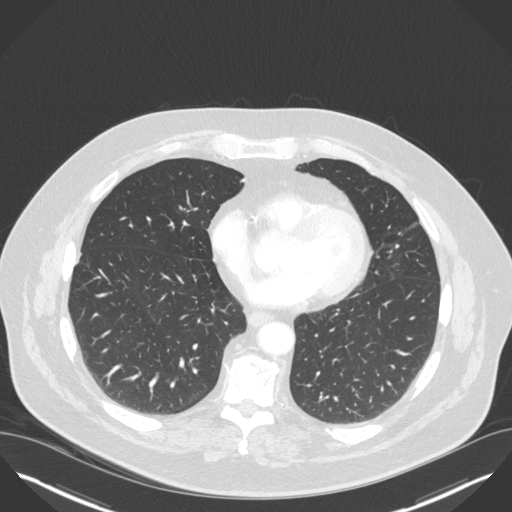
[im 70/158  lung]
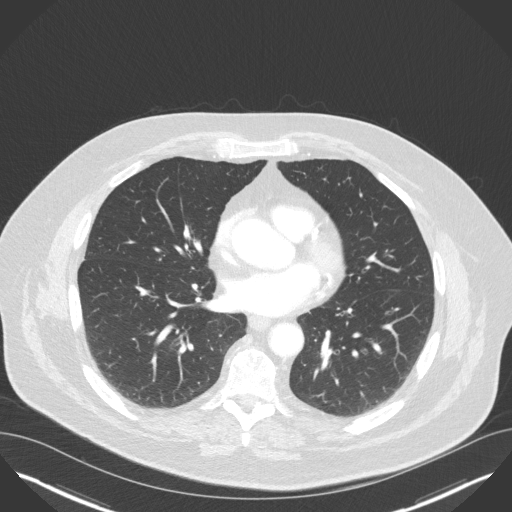
[im 88/158  lung]
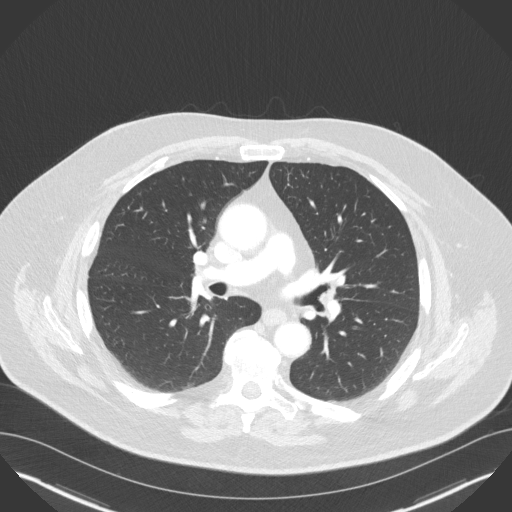
[im 99/158  lung]
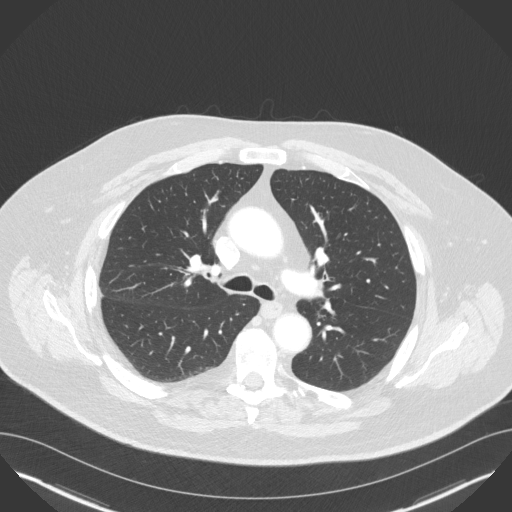
[im 111/158  mediastinal]
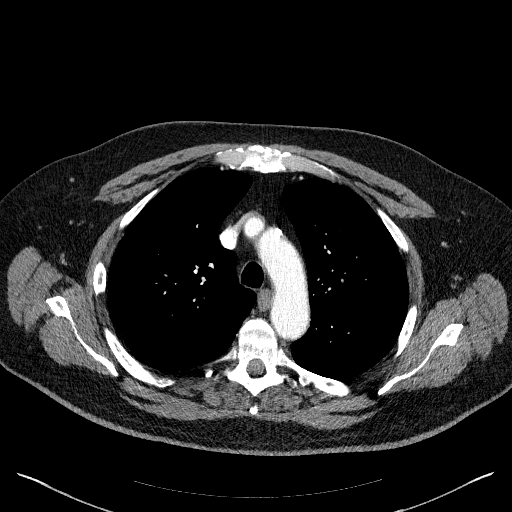
[im 111/158  lung]
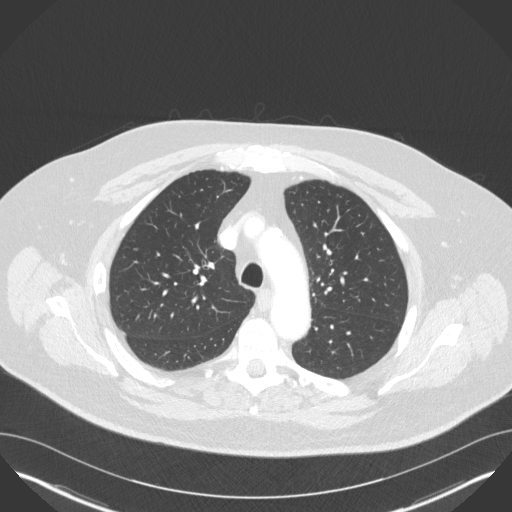
[im 123/158  lung]
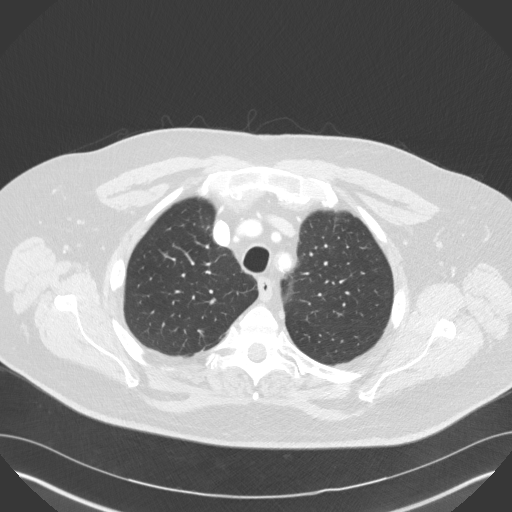
[im 134/158  lung]
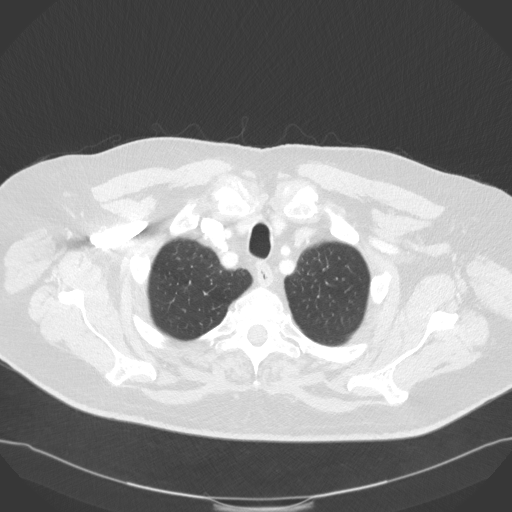
[im 146/158  lung]
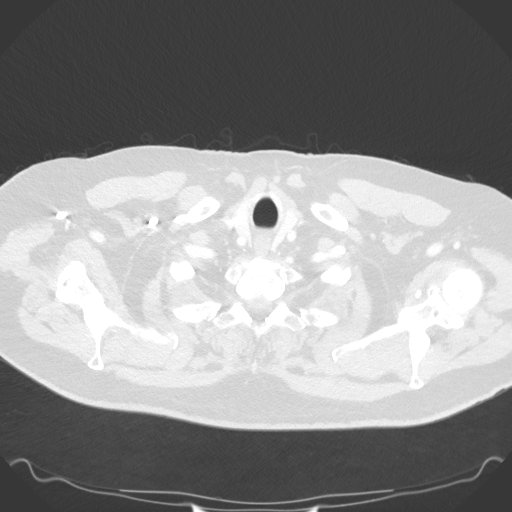

[Series 5: coronal · coronal · 0.69mm/px · 3 of 150 slices shown]
[im 30/150  lung]
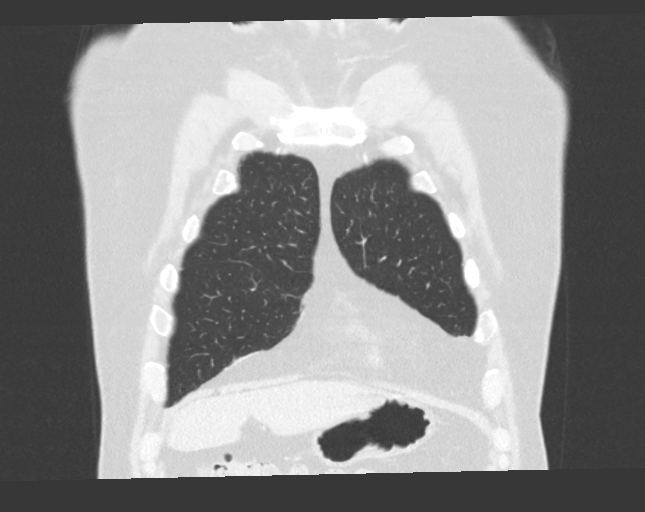
[im 60/150  lung]
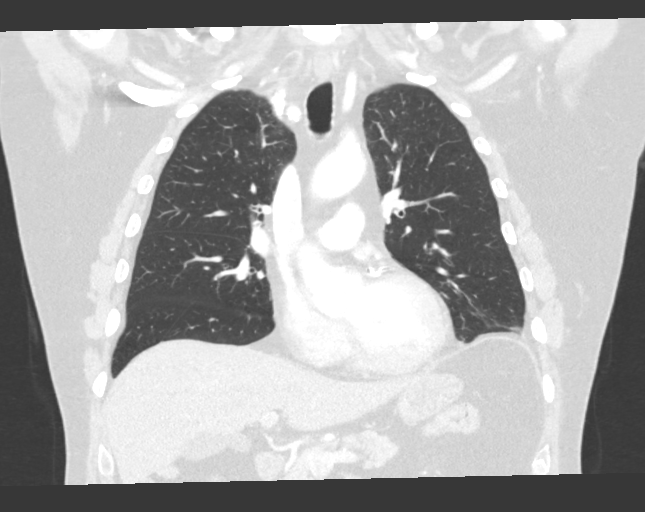
[im 90/150  lung]
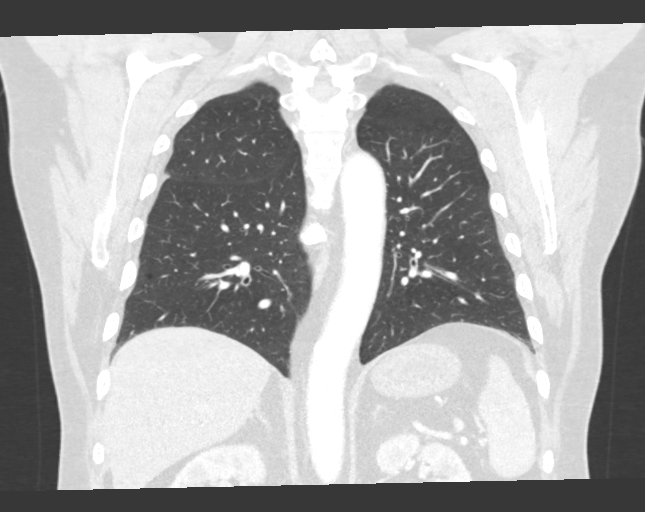

[15 of 36 positions shown; findings below may reference images not displayed]

FINDINGS: Cardiovascular: Heart size is normal. There is no significant
pericardial fluid, thickening or pericardial calcification. There is
aortic atherosclerosis, as well as atherosclerosis of the great
vessels of the mediastinum and the coronary arteries, including
calcified atherosclerotic plaque in the left main, left anterior
descending, left circumflex and right coronary arteries.

Mediastinum/Nodes: Mediastinal or hilar no pathologically enlarged
lymph nodes. Please note that accurate exclusion of hilar adenopathy
is limited on noncontrast CT scans. Esophagus is unremarkable in
appearance. No axillary lymphadenopathy.

Lungs/Pleura: No suspicious appearing pulmonary nodules or masses
are noted. No acute consolidative airspace disease. No pleural
effusions.

Upper Abdomen: Aortic atherosclerosis. 9 mm simple cyst in the
posterior aspect of the interpolar region of the right kidney.

Musculoskeletal: There are no aggressive appearing lytic or blastic
lesions noted in the visualized portions of the skeleton.
IMPRESSION: 1. No suspicious appearing pulmonary nodules or masses are noted.
2. Aortic atherosclerosis, in addition to left main and 3 vessel
coronary artery disease. Please note that although the presence of
coronary artery calcium documents the presence of coronary artery
disease, the severity of this disease and any potential stenosis
cannot be assessed on this non-gated CT examination. Assessment for
potential risk factor modification, dietary therapy or pharmacologic
therapy may be warranted, if clinically indicated.

Aortic Atherosclerosis ([id number]).

## 2023-11-09 ENCOUNTER — Other Ambulatory Visit: Payer: Self-pay | Admitting: Neurology

## 2023-11-09 DIAGNOSIS — R413 Other amnesia: Secondary | ICD-10-CM

## 2023-11-13 ENCOUNTER — Ambulatory Visit
Admission: RE | Admit: 2023-11-13 | Discharge: 2023-11-13 | Disposition: A | Discharge status: Home / Self Care | Source: Ambulatory Visit | Attending: Neurology | Admitting: Neurology

## 2023-11-13 DIAGNOSIS — R413 Other amnesia: Secondary | ICD-10-CM | POA: Diagnosis present

## 2023-12-28 ENCOUNTER — Ambulatory Visit: Admitting: Physician Assistant

## 2024-03-20 IMAGING — CT CT CHEST W/O CM
2 of 4 series · 14 of 36 positions shown, 17 images · non-contrast
Comparison: Chest CTs 09/01/2020, 10/07/2020, 02/28/2021

CLINICAL DATA: COPD, worsening dyspnea, previous tobacco abuse



[Series 2: thorax · axial · 0.78mm/px · z∈[-579,-337]mm · 11 of 143 slices shown, 14 images]
[im 11/143  mediastinal]
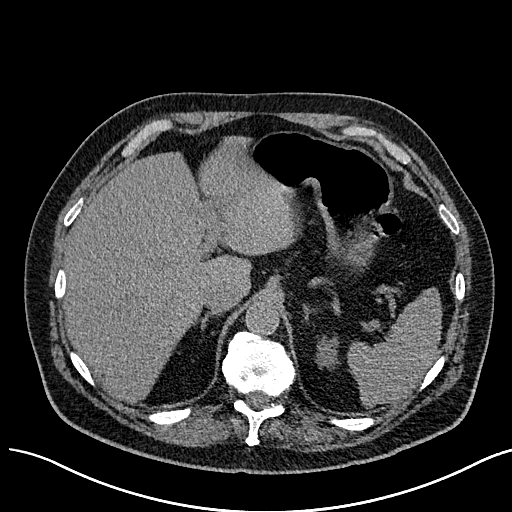
[im 11/143  lung]
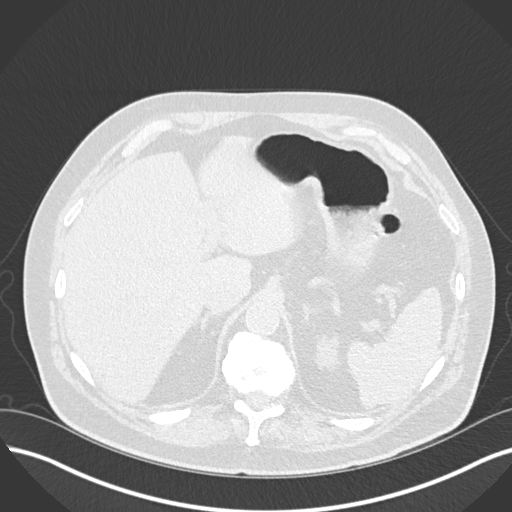
[im 22/143  lung]
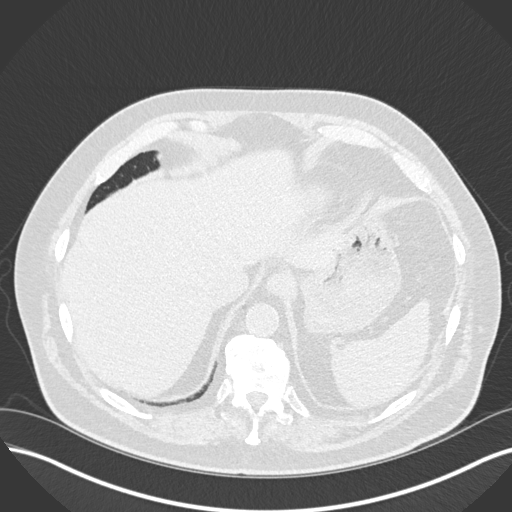
[im 33/143  lung]
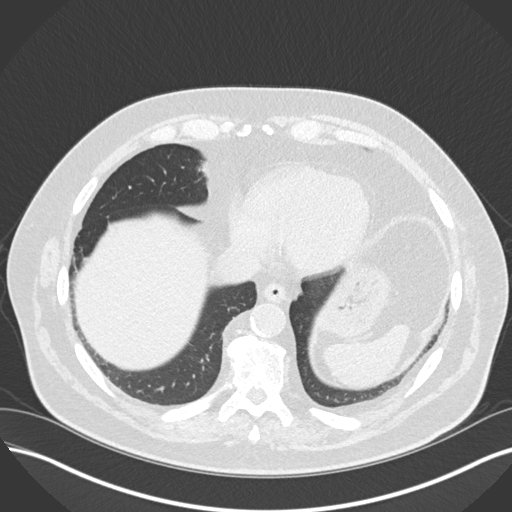
[im 44/143  lung]
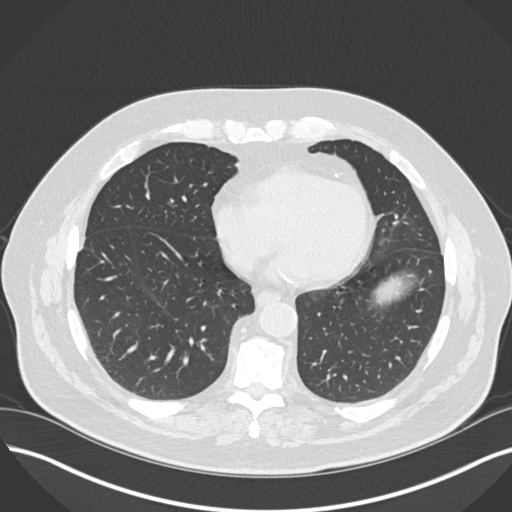
[im 55/143  mediastinal]
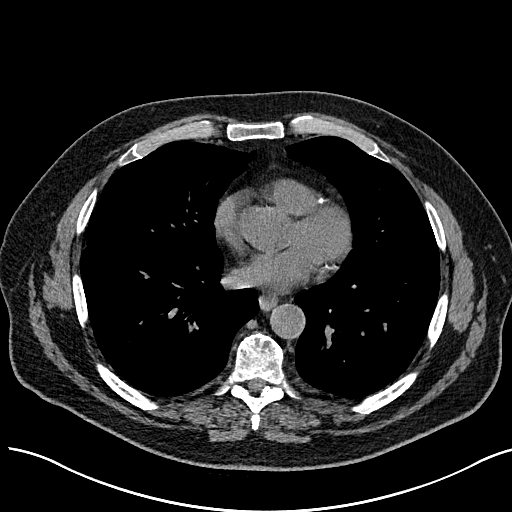
[im 55/143  lung]
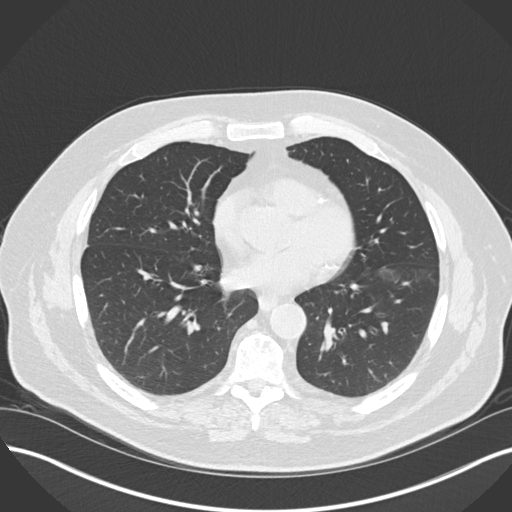
[im 77/143  lung]
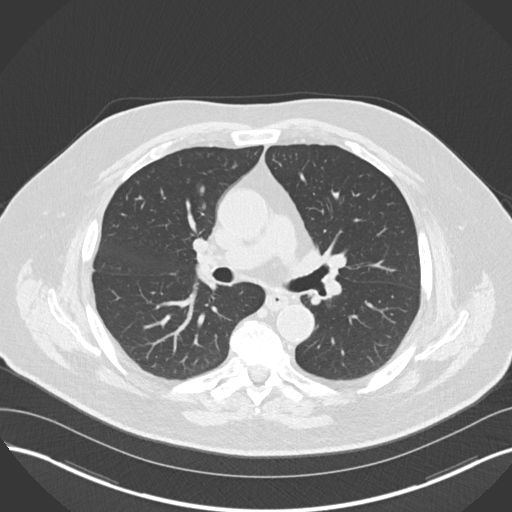
[im 88/143  lung]
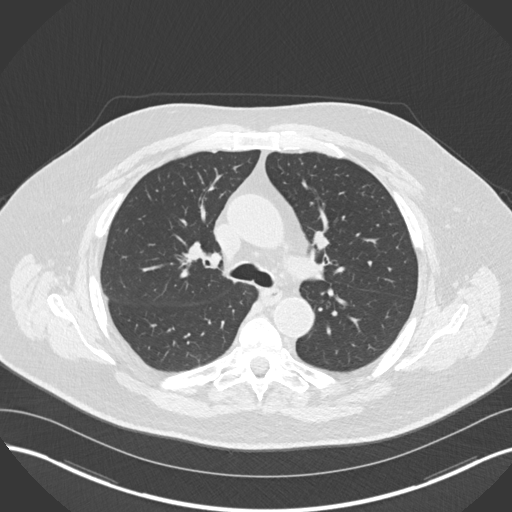
[im 99/143  lung]
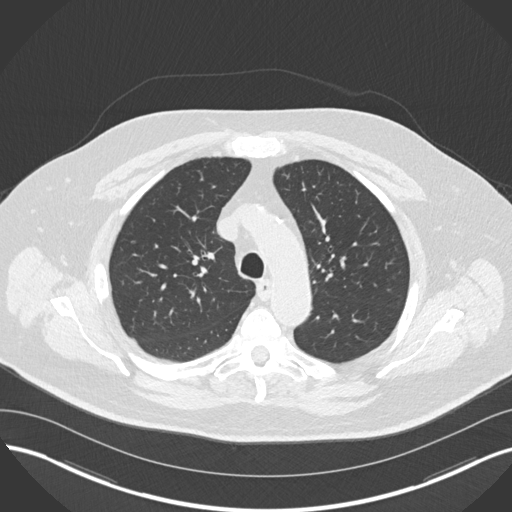
[im 110/143  mediastinal]
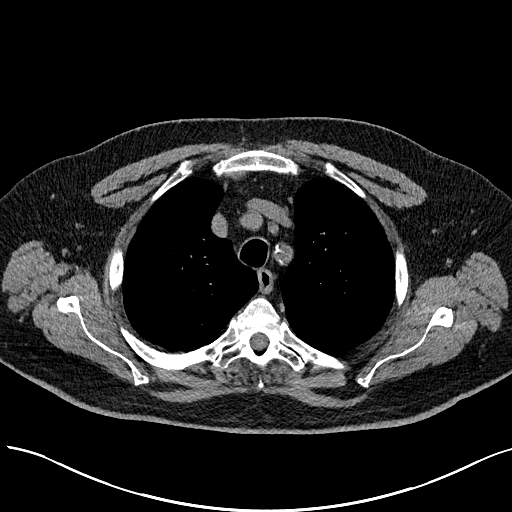
[im 110/143  lung]
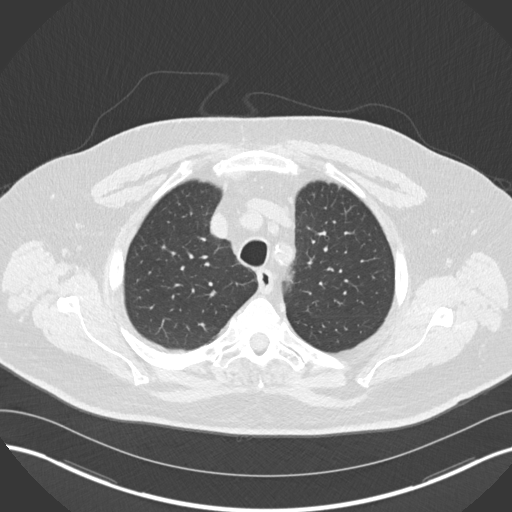
[im 121/143  lung]
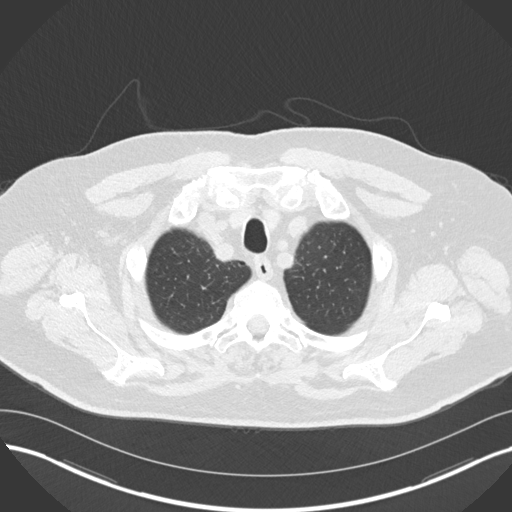
[im 132/143  lung]
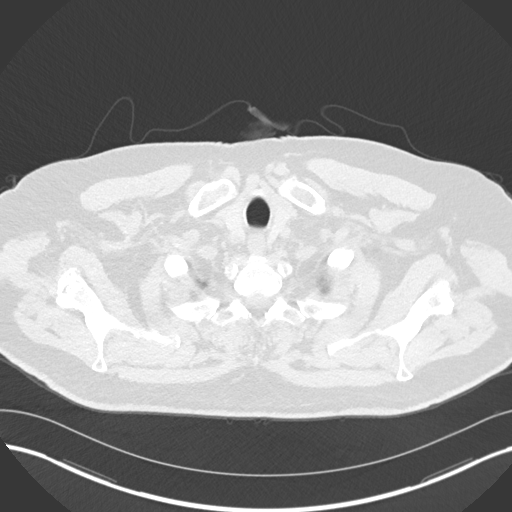

[Series 5: coronal · coronal · 0.57mm/px · 3 of 152 slices shown]
[im 31/152  lung]
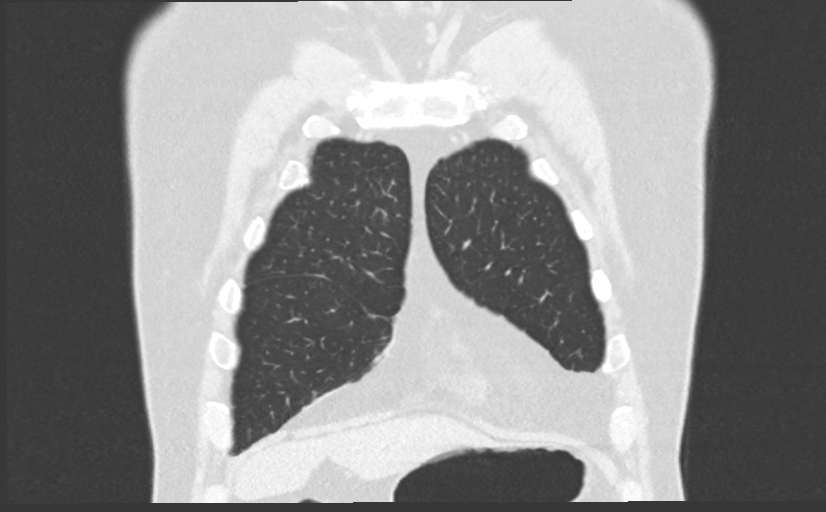
[im 61/152  lung]
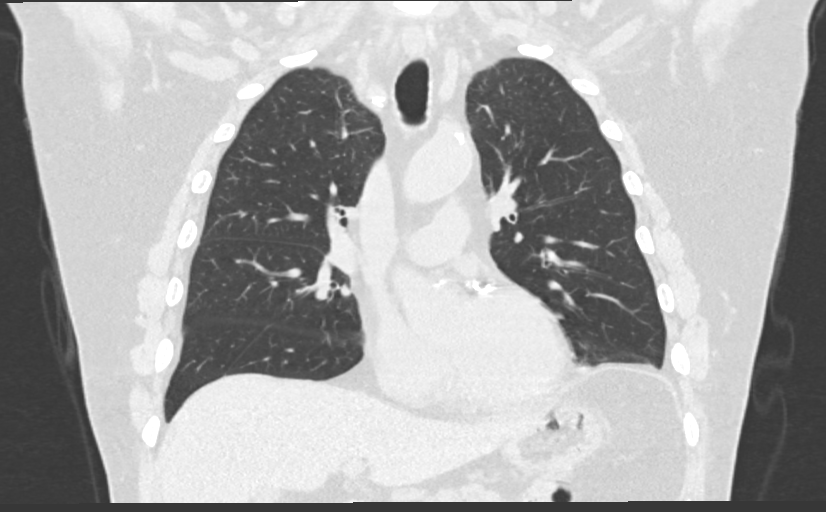
[im 91/152  lung]
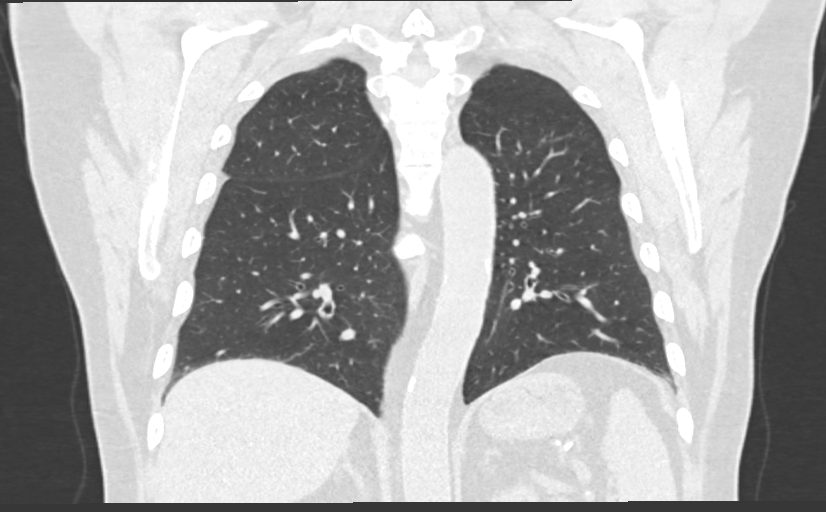

[14 of 36 positions shown; findings below may reference images not displayed]

FINDINGS: Cardiovascular: Limited unenhanced imaging of the heart and great
vessels demonstrates no pericardial effusion. There is severe 3
vessel coronary artery atherosclerosis again noted unchanged. Stable
caliber of the thoracic aorta without evidence of frank aneurysm.
Stable atherosclerosis of the aortic arch and descending thoracic
aorta. Evaluation of the vascular lumen is limited without IV
contrast.

Mediastinum/Nodes: No enlarged mediastinal or axillary lymph nodes.
Thyroid gland, trachea, and esophagus demonstrate no significant
findings.

Lungs/Pleura: No acute airspace disease, effusion, or pneumothorax.
Minimal scarring at the lung bases, greatest in the lingula,
unchanged since prior exam. Central airways are patent. No evidence
of bronchial wall thickening or bronchiectasis.

Upper Abdomen: No acute abnormality.

Musculoskeletal: No acute or destructive bony lesion. Stable
degenerative changes of the bilateral shoulders and diffuse thoracic
spondylosis. Reconstructed images demonstrate no additional
findings.
IMPRESSION: 1. No acute intrathoracic process.
2. Aortic Atherosclerosis ([id number]). Severe coronary artery
atherosclerosis.

## 2024-05-07 ENCOUNTER — Other Ambulatory Visit: Payer: Self-pay | Admitting: Orthopedic Surgery

## 2024-05-07 DIAGNOSIS — M545 Low back pain, unspecified: Secondary | ICD-10-CM

## 2024-05-07 DIAGNOSIS — M5136 Other intervertebral disc degeneration, lumbar region with discogenic back pain only: Secondary | ICD-10-CM

## 2024-05-07 DIAGNOSIS — M47816 Spondylosis without myelopathy or radiculopathy, lumbar region: Secondary | ICD-10-CM

## 2024-05-10 ENCOUNTER — Ambulatory Visit
Admission: RE | Admit: 2024-05-10 | Discharge: 2024-05-10 | Disposition: A | Source: Ambulatory Visit | Attending: Orthopedic Surgery | Admitting: Orthopedic Surgery

## 2024-05-10 DIAGNOSIS — M5136 Other intervertebral disc degeneration, lumbar region with discogenic back pain only: Secondary | ICD-10-CM | POA: Diagnosis present

## 2024-05-10 DIAGNOSIS — M47816 Spondylosis without myelopathy or radiculopathy, lumbar region: Secondary | ICD-10-CM | POA: Diagnosis present

## 2024-05-10 DIAGNOSIS — M545 Low back pain, unspecified: Secondary | ICD-10-CM | POA: Insufficient documentation

## 2024-09-26 ENCOUNTER — Ambulatory Visit: Admitting: Urology
